# Patient Record
Sex: Female | Born: 1935
Health system: Southern US, Community
[De-identification: ages and names within clinical notes are randomized; demographics above are authoritative.]

## PROBLEM LIST (undated history)

## (undated) DIAGNOSIS — R110 Nausea: Secondary | ICD-10-CM

## (undated) DIAGNOSIS — E7800 Pure hypercholesterolemia, unspecified: Secondary | ICD-10-CM

## (undated) DIAGNOSIS — Z9289 Personal history of other medical treatment: Secondary | ICD-10-CM

## (undated) DIAGNOSIS — N1330 Unspecified hydronephrosis: Secondary | ICD-10-CM

## (undated) DIAGNOSIS — I10 Essential (primary) hypertension: Secondary | ICD-10-CM

## (undated) DIAGNOSIS — S37019A Minor contusion of unspecified kidney, initial encounter: Secondary | ICD-10-CM

## (undated) DIAGNOSIS — N200 Calculus of kidney: Secondary | ICD-10-CM

## (undated) DIAGNOSIS — K219 Gastro-esophageal reflux disease without esophagitis: Secondary | ICD-10-CM

## (undated) DIAGNOSIS — I5022 Chronic systolic (congestive) heart failure: Secondary | ICD-10-CM

## (undated) DIAGNOSIS — N201 Calculus of ureter: Secondary | ICD-10-CM

## (undated) DIAGNOSIS — M1990 Unspecified osteoarthritis, unspecified site: Secondary | ICD-10-CM

## (undated) DIAGNOSIS — Z87442 Personal history of urinary calculi: Secondary | ICD-10-CM

## (undated) DIAGNOSIS — M109 Gout, unspecified: Secondary | ICD-10-CM

## (undated) DIAGNOSIS — R310 Gross hematuria: Secondary | ICD-10-CM

## (undated) HISTORY — PX: COLONOSCOPY: SHX174

## (undated) HISTORY — DX: Calculus of kidney: N20.0

## (undated) HISTORY — DX: Minor contusion of unspecified kidney, initial encounter: S37.019A

## (undated) HISTORY — DX: Calculus of ureter: N20.1

## (undated) HISTORY — DX: Unspecified hydronephrosis: N13.30

## (undated) HISTORY — DX: Essential (primary) hypertension: I10

## (undated) HISTORY — DX: Nausea: R11.0

## (undated) HISTORY — PX: ABDOMINAL HYSTERECTOMY: SHX81

## (undated) HISTORY — DX: Gross hematuria: R31.0

## (undated) HISTORY — PX: KIDNEY STONE SURGERY: SHX686

---

## 2005-08-13 ENCOUNTER — Emergency Department: Payer: Self-pay | Admitting: Emergency Medicine

## 2005-10-01 ENCOUNTER — Other Ambulatory Visit: Payer: Self-pay

## 2005-10-01 ENCOUNTER — Ambulatory Visit: Payer: Self-pay | Admitting: Specialist

## 2005-10-08 ENCOUNTER — Ambulatory Visit: Payer: Self-pay | Admitting: Specialist

## 2006-04-14 ENCOUNTER — Ambulatory Visit: Payer: Self-pay | Admitting: Ophthalmology

## 2006-04-21 ENCOUNTER — Ambulatory Visit: Payer: Self-pay | Admitting: Ophthalmology

## 2006-04-25 ENCOUNTER — Ambulatory Visit: Payer: Self-pay | Admitting: Ophthalmology

## 2006-04-30 ENCOUNTER — Ambulatory Visit: Payer: Self-pay | Admitting: Ophthalmology

## 2006-08-29 ENCOUNTER — Other Ambulatory Visit: Payer: Self-pay

## 2006-08-29 ENCOUNTER — Emergency Department: Payer: Self-pay

## 2008-03-15 ENCOUNTER — Ambulatory Visit: Payer: Self-pay | Admitting: Internal Medicine

## 2008-06-29 ENCOUNTER — Ambulatory Visit: Payer: Self-pay | Admitting: Internal Medicine

## 2009-03-08 ENCOUNTER — Emergency Department: Payer: Self-pay | Admitting: Emergency Medicine

## 2009-03-20 ENCOUNTER — Emergency Department: Payer: Self-pay | Admitting: Unknown Physician Specialty

## 2009-12-26 ENCOUNTER — Ambulatory Visit: Payer: Self-pay | Admitting: Internal Medicine

## 2010-05-21 ENCOUNTER — Ambulatory Visit: Payer: Self-pay | Admitting: Internal Medicine

## 2010-05-22 ENCOUNTER — Ambulatory Visit: Payer: Self-pay | Admitting: Sports Medicine

## 2010-11-21 ENCOUNTER — Ambulatory Visit: Payer: Self-pay | Admitting: Internal Medicine

## 2010-12-12 ENCOUNTER — Ambulatory Visit: Payer: Self-pay | Admitting: Internal Medicine

## 2011-02-26 ENCOUNTER — Ambulatory Visit: Payer: Self-pay | Admitting: Internal Medicine

## 2012-03-13 ENCOUNTER — Ambulatory Visit: Payer: Self-pay | Admitting: Internal Medicine

## 2012-05-19 ENCOUNTER — Ambulatory Visit: Payer: Self-pay | Admitting: Podiatry

## 2012-08-25 ENCOUNTER — Emergency Department: Payer: Self-pay | Admitting: *Deleted

## 2013-03-16 ENCOUNTER — Ambulatory Visit: Payer: Self-pay

## 2013-10-20 ENCOUNTER — Emergency Department: Payer: Self-pay | Admitting: Emergency Medicine

## 2013-10-20 LAB — COMPREHENSIVE METABOLIC PANEL
Alkaline Phosphatase: 54 U/L (ref 50–136)
BUN: 20 mg/dL — ABNORMAL HIGH (ref 7–18)
Co2: 25 mmol/L (ref 21–32)
Creatinine: 1.32 mg/dL — ABNORMAL HIGH (ref 0.60–1.30)
EGFR (African American): 42 — ABNORMAL LOW
SGOT(AST): 29 U/L (ref 15–37)
Sodium: 139 mmol/L (ref 136–145)
Total Protein: 6.5 g/dL (ref 6.4–8.2)

## 2013-10-20 LAB — CBC
HCT: 36.5 % (ref 35.0–47.0)
MCH: 30 pg (ref 26.0–34.0)
MCV: 90 fL (ref 80–100)
Platelet: 206 10*3/uL (ref 150–440)
RBC: 4.05 10*6/uL (ref 3.80–5.20)

## 2013-10-20 LAB — LIPASE, BLOOD: Lipase: 228 U/L (ref 73–393)

## 2013-10-27 ENCOUNTER — Ambulatory Visit: Payer: Self-pay | Admitting: Urology

## 2013-10-27 DIAGNOSIS — I10 Essential (primary) hypertension: Secondary | ICD-10-CM

## 2013-11-01 ENCOUNTER — Ambulatory Visit: Payer: Self-pay | Admitting: Urology

## 2013-11-16 ENCOUNTER — Ambulatory Visit: Payer: Self-pay | Admitting: Urology

## 2013-11-18 ENCOUNTER — Ambulatory Visit: Payer: Self-pay | Admitting: Urology

## 2013-12-15 ENCOUNTER — Ambulatory Visit: Payer: Self-pay | Admitting: Urology

## 2014-04-06 ENCOUNTER — Ambulatory Visit: Payer: Self-pay | Admitting: Urology

## 2014-06-27 ENCOUNTER — Emergency Department: Payer: Self-pay | Admitting: Emergency Medicine

## 2014-06-27 LAB — TROPONIN I: Troponin-I: <0.02 ng/mL

## 2014-06-27 LAB — BASIC METABOLIC PANEL
Anion Gap: 10 (ref 7–16)
BUN: 26 mg/dL — ABNORMAL HIGH (ref 7–18)
CREATININE: 1.73 mg/dL — AB (ref 0.60–1.30)
Calcium, Total: 8.6 mg/dL (ref 8.5–10.1)
Chloride: 103 mmol/L (ref 98–107)
Co2: 23 mmol/L (ref 21–32)
EGFR (African American): 30 — ABNORMAL LOW
EGFR (Non-African Amer.): 26 — ABNORMAL LOW
Glucose: 126 mg/dL — ABNORMAL HIGH (ref 65–99)
Osmolality: 278 (ref 275–301)
Potassium: 3.7 mmol/L (ref 3.5–5.1)
SODIUM: 136 mmol/L (ref 136–145)

## 2014-06-27 LAB — CBC
HCT: 34.2 % — ABNORMAL LOW (ref 35.0–47.0)
HGB: 10.9 g/dL — ABNORMAL LOW (ref 12.0–16.0)
MCH: 29.4 pg (ref 26.0–34.0)
MCHC: 31.9 g/dL — ABNORMAL LOW (ref 32.0–36.0)
MCV: 92 fL (ref 80–100)
Platelet: 218 10*3/uL (ref 150–440)
RBC: 3.72 10*6/uL — ABNORMAL LOW (ref 3.80–5.20)
RDW: 14.3 % (ref 11.5–14.5)
WBC: 6.9 10*3/uL (ref 3.6–11.0)

## 2014-06-27 LAB — CK TOTAL AND CKMB (NOT AT ARMC)
CK, TOTAL: 96 U/L
CK-MB: 1.5 ng/mL (ref 0.5–3.6)

## 2014-10-20 ENCOUNTER — Ambulatory Visit: Payer: Self-pay | Admitting: Urology

## 2015-03-31 NOTE — Op Note (Signed)
PATIENT NAME:  Kathy Villanueva, Kathy Villanueva MR#:  [id number] DATE OF BIRTH:  1924/04/01  DATE OF PROCEDURE:  11/01/2013  PREOPERATIVE DIAGNOSIS: Right ureteropelvic junction calculus with obstruction.   POSTOPERATIVE DIAGNOSIS: Right ureteropelvic junction calculus with obstruction.   PROCEDURE: Right retrograde pyelogram and cystoscopy with placement of a right ureteral stent.   SURGEON: Emerson Schreifels D. Elnoria Howard, DO  ANESTHESIA: General.  DESCRIPTION OF PROCEDURE: The patient was sterilely prepped and draped in supine lithotomy position for ease of approach to the bladder. I used a 22-French cystoscope after appropriate timeout. The 22 cystoscope was well lubricated, wide mouth, to look in the bladder. The ureters seemed to be in B position with horseshoe-like orifices. The bladder itself does not show a great amount of trabeculation for an 81 year old patient. There was no bladder tumor, masses, or growths in the bladder. The right retrograde is done. The patient is obviously very constipated. So the right retrograde is done and stent is placed after seeing a UPJ filling defect. After the stent is placed, drainage is done. I checked the position of the stent. Stent is in good position in the bladder. The patient is sent to recovery in satisfactory condition. Bladder was then emptied. 30 mL of 0.5% Marcaine was placed in the bladder, a B and O suppository in the rectum. She tolerated the procedure well.  ____________________________ Janice Coffin. Elnoria Howard, DO rdh:sb D: 11/01/2013 13:35:52 ET T: 11/01/2013 14:28:33 ET JOB#: [id number]  cc: Janice Coffin. Elnoria Howard, DO, <Dictator> Aviela Blundell D Kenedee Molesky DO ELECTRONICALLY SIGNED 11/26/2013 15:39

## 2015-10-26 ENCOUNTER — Encounter: Payer: Self-pay | Admitting: *Deleted

## 2015-11-01 ENCOUNTER — Ambulatory Visit: Payer: Self-pay | Admitting: Urology

## 2015-11-10 ENCOUNTER — Encounter: Payer: Self-pay | Admitting: Urology

## 2015-11-10 ENCOUNTER — Ambulatory Visit (INDEPENDENT_AMBULATORY_CARE_PROVIDER_SITE_OTHER): Payer: Medicare Other | Admitting: Urology

## 2015-11-10 VITALS — BP 188/75 | HR 60 | Ht 69.0 in | Wt 106.0 lb

## 2015-11-10 DIAGNOSIS — Z87442 Personal history of urinary calculi: Secondary | ICD-10-CM | POA: Diagnosis not present

## 2015-11-10 DIAGNOSIS — S37019S Minor contusion of unspecified kidney, sequela: Secondary | ICD-10-CM | POA: Diagnosis not present

## 2015-11-10 LAB — URINALYSIS, COMPLETE
BILIRUBIN UA: NEGATIVE
Glucose, UA: NEGATIVE
KETONES UA: NEGATIVE
LEUKOCYTES UA: NEGATIVE
Nitrite, UA: NEGATIVE
PH UA: 5.5 (ref 5.0–7.5)
SPEC GRAV UA: 1.025 (ref 1.005–1.030)
Urobilinogen, Ur: 0.2 mg/dL (ref 0.2–1.0)

## 2015-11-10 LAB — MICROSCOPIC EXAMINATION
BACTERIA UA: NONE SEEN
RENAL EPITHEL UA: NONE SEEN /HPF

## 2015-11-10 NOTE — Progress Notes (Signed)
11/10/2015 8:43 PM   Kathy Villanueva [birth year] [id number]  Referring provider: No referring provider defined for this encounter.  Chief Complaint  Patient presents with  . Hematoma of kidney    1 year recheck    HPI: Patient is an 81 year old African-American female who suffered a renal hematoma after ESWL in 03/2014 who presents today for her yearly recheck.  Background history Patient underwent ESWL on 11/18/2013 for right UPJ stone ( 8 mm x 5 mm).  The postprocedural course was uneventful.  Follow-up renal ultrasound performed 1 month later did not note any hydronephrosis. Patient then underwent a 24-hour litho link report.  She was still experiencing intermittent right-sided flank pain at her return appointment to discuss results of the 24-hour litho-link report, so when an office renal ultrasound was performed. Hydronephrosis was noted in the right kidney and patient underwent CT scan for further evaluation.  CT scan noted a 4 cm x 3 cm x 1.9 cm subscapular hematoma.  A follow-up renal ultrasound noted a hematoma had reduced in size.  Today, the patient is not experiencing right-sided flank pain. Her urinalysis is negative.  Her most recent serum creatinine is 1.4 on 08/21/2015.   PMH: Past Medical History  Diagnosis Date  . Hematoma of kidney   . HTN (hypertension)   . Nausea   . Kidney stones   . Hydronephrosis   . Gross hematuria   . Ureteral stone     Surgical History: Past Surgical History  Procedure Laterality Date  . Abdominal hysterectomy      Home Medications:    Medication List       This list is accurate as of: 11/10/15 11:59 PM.  Always use your most recent med list.               amLODipine 5 MG tablet  Commonly known as:  NORVASC  TAKE 1 TABLET BY MOUTH EACH DAY     hydrochlorothiazide 12.5 MG tablet  Commonly known as:  HYDRODIURIL  Take by mouth.     RA VITAMIN B-12 TR 1000 MCG Tbcr  Generic drug:  Cyanocobalamin  Take by mouth.          Allergies: No Known Allergies  Family History: Family History  Problem Relation Age of Onset  . Kidney disease Neg Hx   . Bladder Cancer Neg Hx     Social History:  reports that she has never smoked. She does not have any smokeless tobacco history on file. She reports that she does not drink alcohol or use illicit drugs.  ROS: UROLOGY Frequent Urination?: No Hard to postpone urination?: No Burning/pain with urination?: No Get up at night to urinate?: No Leakage of urine?: No Urine stream starts and stops?: No Trouble starting stream?: No Do you have to strain to urinate?: No Blood in urine?: No Urinary tract infection?: No Sexually transmitted disease?: No Injury to kidneys or bladder?: No Painful intercourse?: No Weak stream?: No Currently pregnant?: No Vaginal bleeding?: No Last menstrual period?: n  Gastrointestinal Nausea?: No Vomiting?: No Indigestion/heartburn?: No Diarrhea?: No Constipation?: No  Constitutional Fever: No Night sweats?: No Weight loss?: No Fatigue?: No  Skin Skin rash/lesions?: No Itching?: No  Eyes Blurred vision?: No Double vision?: No  Ears/Nose/Throat Sore throat?: No Sinus problems?: No  Hematologic/Lymphatic Swollen glands?: No Easy bruising?: No  Cardiovascular Leg swelling?: No Chest pain?: No  Respiratory Cough?: No Shortness of breath?: No  Endocrine Excessive thirst?: No  Musculoskeletal Back pain?: No Joint  pain?: No  Neurological Headaches?: No Dizziness?: No  Psychologic Depression?: No Anxiety?: No  Physical Exam: BP 188/75 mmHg  Pulse 60  Ht 5\' 9"  (1.753 m)  Wt 106 lb (48.081 kg)  BMI 15.65 kg/m2  Constitutional: Well nourished. Alert and oriented, No acute distress. HEENT: [place] AT, moist mucus membranes. Trachea midline, no masses. Cardiovascular: No clubbing, cyanosis, or edema. Respiratory: Normal respiratory effort, no increased work of breathing. GI: Abdomen is soft, non  tender, non distended, no abdominal masses. Liver and spleen not palpable.  No hernias appreciated.  Stool sample for occult testing is not indicated.   GU: No CVA tenderness.  No bladder fullness or masses.   Skin: No rashes, bruises or suspicious lesions. Lymph: No cervical or inguinal adenopathy. Neurologic: Grossly intact, no focal deficits, moving all 4 extremities. Psychiatric: Normal mood and affect.  Laboratory Data: Lab Results  Component Value Date   WBC 6.9 06/27/2014   HGB 10.9* 06/27/2014   HCT 34.2* 06/27/2014   MCV 92 06/27/2014   PLT 218 06/27/2014    Lab Results  Component Value Date   CREATININE 1.73* 06/27/2014    Urinalysis Results for orders placed or performed in visit on 11/10/15  Microscopic Examination  Result Value Ref Range   WBC, UA 0-5 0 -  5 /hpf   RBC, UA 0-2 0 -  2 /hpf   Epithelial Cells (non renal) 0-10 0 - 10 /hpf   Renal Epithel, UA None seen None seen /hpf   Casts Present (A) None seen /lpf   Cast Type Hyaline casts N/A   Bacteria, UA None seen None seen/Few  Urinalysis, Complete  Result Value Ref Range   Specific Gravity, UA 1.025 1.005 - 1.030   pH, UA 5.5 5.0 - 7.5   Color, UA Yellow Yellow   Appearance Ur Clear Clear   Leukocytes, UA Negative Negative   Protein, UA 1+ (A) Negative/Trace   Glucose, UA Negative Negative   Ketones, UA Negative Negative   RBC, UA 1+ (A) Negative   Bilirubin, UA Negative Negative   Urobilinogen, Ur 0.2 0.2 - 1.0 mg/dL   Nitrite, UA Negative Negative   Microscopic Examination See below:     Assessment & Plan:    1. Hematoma of kidney, unspecified laterality, sequela:   Patient is not experiencing any further flank pain, gross hematuria and her serum creatinine is stable. She does not wish to undergo any further imaging studies at this time. She will follow-up in one year for UA and symptom check.  - Urinalysis, Complete  2. History of nephrolithiasis:   Patient has not had any further flank  pain or passage of stones. She does not desire any imaging study at this time. She will return in 1 year for UA and symptom recheck.   Return in about 1 year (around 11/09/2016) for UA  .  Zara Council, [facility] [address], [place],  [address] [phone]

## 2015-11-12 DIAGNOSIS — Z87442 Personal history of urinary calculi: Secondary | ICD-10-CM | POA: Insufficient documentation

## 2015-11-12 DIAGNOSIS — S37019A Minor contusion of unspecified kidney, initial encounter: Secondary | ICD-10-CM | POA: Insufficient documentation

## 2016-09-24 NOTE — Discharge Instructions (Signed)

## 2016-09-27 ENCOUNTER — Encounter: Payer: Self-pay | Admitting: Anesthesiology

## 2016-10-02 ENCOUNTER — Ambulatory Visit: Admission: RE | Admit: 2016-10-02 | Payer: Medicaid Other | Source: Ambulatory Visit | Admitting: Ophthalmology

## 2016-10-02 HISTORY — DX: Gastro-esophageal reflux disease without esophagitis: K21.9

## 2016-10-02 HISTORY — DX: Pure hypercholesterolemia, unspecified: E78.00

## 2016-10-02 HISTORY — DX: Unspecified osteoarthritis, unspecified site: M19.90

## 2016-10-02 HISTORY — DX: Personal history of other medical treatment: Z92.89

## 2016-10-02 HISTORY — DX: Gout, unspecified: M10.9

## 2016-10-02 HISTORY — DX: Personal history of urinary calculi: Z87.442

## 2016-10-02 SURGERY — PHACOEMULSIFICATION, CATARACT, WITH IOL INSERTION
Anesthesia: Topical | Laterality: Left

## 2016-11-13 ENCOUNTER — Encounter: Payer: Self-pay | Admitting: Urology

## 2016-11-13 ENCOUNTER — Ambulatory Visit: Payer: Medicare Other | Admitting: Urology

## 2016-11-13 NOTE — Progress Notes (Deleted)
11/13/2016 8:43 AM   Kathy Villanueva 1923/12/25 [id number]  Referring provider: Leonel Ramsay, MD [place], [place] [address]  No chief complaint on file.   HPI: Patient is an 82 year old African-American female who suffered a renal hematoma after ESWL in 03/2014 who presents today for her yearly recheck.  Background history Patient underwent ESWL on 11/18/2013 for right UPJ stone ( 8 mm x 5 mm).  The postprocedural course was uneventful.  Follow-up renal ultrasound performed 1 month later did not note any hydronephrosis. Patient then underwent a 24-hour litho link report.  She was still experiencing intermittent right-sided flank pain at her return appointment to discuss results of the 24-hour litho-link report, so when an office renal ultrasound was performed. Hydronephrosis was noted in the right kidney and patient underwent CT scan for further evaluation.  CT scan noted a 4 cm x 3 cm x 1.9 cm subscapular hematoma.  A follow-up renal ultrasound noted a hematoma had reduced in size.  Today, the patient is not experiencing right-sided flank pain. Her urinalysis is negative.  Her most recent serum creatinine is 1.3 on 09/17/2016, which is stable.   PMH: Past Medical History:  Diagnosis Date  . Arthritis    hand  . GERD (gastroesophageal reflux disease)   . Gout    hand  . Gross hematuria   . Hematoma of kidney   . History of kidney stones   . HTN (hypertension)    controlled on meds  . Hydronephrosis   . Hypercholesteremia   . Kidney stones   . Nausea   . Transfusion history   . Ureteral stone     Surgical History: Past Surgical History:  Procedure Laterality Date  . ABDOMINAL HYSTERECTOMY    . COLONOSCOPY    . KIDNEY STONE SURGERY      Home Medications:    Medication List       Accurate as of 11/13/16  8:43 AM. Always use your most recent med list.          amLODipine 5 MG tablet Commonly  known as:  NORVASC TAKE 1 TABLET BY MOUTH EACH DAY   hydrochlorothiazide 12.5 MG tablet Commonly known as:  HYDRODIURIL Take by mouth.   mirtazapine 7.5 MG tablet Commonly known as:  REMERON Take 7.5 mg by mouth at bedtime.   RA VITAMIN B-12 TR 1000 MCG Tbcr Generic drug:  Cyanocobalamin Take by mouth.       Allergies: No Known Allergies  Family History: Family History  Problem Relation Age of Onset  . Kidney disease Neg Hx   . Bladder Cancer Neg Hx     Social History:  reports that she has never smoked. She has never used smokeless tobacco. She reports that she does not drink alcohol or use drugs.  ROS:                                        Physical Exam: There were no vitals taken for this visit.  Constitutional: Well nourished. Alert and oriented, No acute distress. HEENT: [place] AT, moist mucus membranes. Trachea midline, no masses. Cardiovascular: No clubbing, cyanosis, or edema. Respiratory: Normal respiratory effort, no increased work of breathing. GI: Abdomen is soft, non tender, non distended, no abdominal masses. Liver and spleen not palpable.  No hernias appreciated.  Stool sample for occult testing is  not indicated.   GU: No CVA tenderness.  No bladder fullness or masses.   Skin: No rashes, bruises or suspicious lesions. Lymph: No cervical or inguinal adenopathy. Neurologic: Grossly intact, no focal deficits, moving all 4 extremities. Psychiatric: Normal mood and affect.  Laboratory Data: Lab Results  Component Value Date   WBC 6.9 06/27/2014   HGB 10.9 (L) 06/27/2014   HCT 34.2 (L) 06/27/2014   MCV 92 06/27/2014   PLT 218 06/27/2014    Lab Results  Component Value Date   CREATININE 1.73 (H) 06/27/2014    Urinalysis Results for orders placed or performed in visit on 11/10/15  Microscopic Examination  Result Value Ref Range   WBC, UA 0-5 0 - 5 /hpf   RBC, UA 0-2 0 - 2 /hpf   Epithelial Cells (non renal) 0-10 0 - 10  /hpf   Renal Epithel, UA None seen None seen /hpf   Casts Present (A) None seen /lpf   Cast Type Hyaline casts N/A   Bacteria, UA None seen None seen/Few  Urinalysis, Complete  Result Value Ref Range   Specific Gravity, UA 1.025 1.005 - 1.030   pH, UA 5.5 5.0 - 7.5   Color, UA Yellow Yellow   Appearance Ur Clear Clear   Leukocytes, UA Negative Negative   Protein, UA 1+ (A) Negative/Trace   Glucose, UA Negative Negative   Ketones, UA Negative Negative   RBC, UA 1+ (A) Negative   Bilirubin, UA Negative Negative   Urobilinogen, Ur 0.2 0.2 - 1.0 mg/dL   Nitrite, UA Negative Negative   Microscopic Examination See below:     Assessment & Plan:    1. Hematoma of kidney, unspecified laterality, sequela:   Patient is not experiencing any further flank pain, gross hematuria and her serum creatinine is stable. She does not wish to undergo any further imaging studies at this time. She will follow-up in one year for UA and symptom check.  - Urinalysis, Complete  2. History of nephrolithiasis:   Patient has not had any further flank pain or passage of stones. She does not desire any imaging study at this time. She will return in 1 year for UA and symptom recheck.   No Follow-up on file.  Zara Council, [facility] [address], [place], [place] [address] [phone]

## 2018-05-26 ENCOUNTER — Other Ambulatory Visit: Payer: Self-pay | Admitting: Physician Assistant

## 2018-05-26 ENCOUNTER — Ambulatory Visit
Admission: RE | Admit: 2018-05-26 | Discharge: 2018-05-26 | Disposition: A | Discharge status: Home / Self Care | Payer: Medicare HMO | Source: Ambulatory Visit | Attending: Physician Assistant | Admitting: Physician Assistant

## 2018-05-26 DIAGNOSIS — H539 Unspecified visual disturbance: Secondary | ICD-10-CM | POA: Diagnosis present

## 2018-05-26 DIAGNOSIS — R42 Dizziness and giddiness: Secondary | ICD-10-CM

## 2018-05-26 DIAGNOSIS — R51 Headache: Secondary | ICD-10-CM | POA: Diagnosis present

## 2018-05-26 DIAGNOSIS — R634 Abnormal weight loss: Secondary | ICD-10-CM

## 2018-05-26 DIAGNOSIS — R519 Headache, unspecified: Secondary | ICD-10-CM

## 2018-05-26 DIAGNOSIS — G3189 Other specified degenerative diseases of nervous system: Secondary | ICD-10-CM | POA: Diagnosis not present

## 2018-05-26 DIAGNOSIS — G960 Cerebrospinal fluid leak: Secondary | ICD-10-CM | POA: Insufficient documentation

## 2019-10-14 ENCOUNTER — Encounter: Payer: Self-pay | Admitting: *Deleted

## 2019-10-14 ENCOUNTER — Other Ambulatory Visit: Payer: Self-pay

## 2019-10-15 ENCOUNTER — Other Ambulatory Visit
Admission: RE | Admit: 2019-10-15 | Discharge: 2019-10-15 | Disposition: A | Discharge status: Home / Self Care | Payer: Medicare HMO | Source: Ambulatory Visit | Attending: Ophthalmology | Admitting: Ophthalmology

## 2019-10-15 DIAGNOSIS — Z01812 Encounter for preprocedural laboratory examination: Secondary | ICD-10-CM | POA: Insufficient documentation

## 2019-10-15 DIAGNOSIS — Z20828 Contact with and (suspected) exposure to other viral communicable diseases: Secondary | ICD-10-CM | POA: Diagnosis not present

## 2019-10-15 LAB — SARS CORONAVIRUS 2 (TAT 6-24 HRS): SARS Coronavirus 2: NEGATIVE

## 2019-10-18 NOTE — Discharge Instructions (Signed)
General Anesthesia, Adult, Care After °This sheet gives you information about how to care for yourself after your procedure. Your health care provider may also give you more specific instructions. If you have problems or questions, contact your health care provider. °What can I expect after the procedure? °After the procedure, the following side effects are common: °· Pain or discomfort at the IV site. °· Nausea. °· Vomiting. °· Sore throat. °· Trouble concentrating. °· Feeling cold or chills. °· Weak or tired. °· Sleepiness and fatigue. °· Soreness and body aches. These side effects can affect parts of the body that were not involved in surgery. °Follow these instructions at home: ° °For at least 24 hours after the procedure: °· Have a responsible adult stay with you. It is important to have someone help care for you until you are awake and alert. °· Rest as needed. °· Do not: °? Participate in activities in which you could fall or become injured. °? Drive. °? Use heavy machinery. °? Drink alcohol. °? Take sleeping pills or medicines that cause drowsiness. °? Make important decisions or sign legal documents. °? Take care of children on your own. °Eating and drinking °· Follow any instructions from your health care provider about eating or drinking restrictions. °· When you feel hungry, start by eating small amounts of foods that are soft and easy to digest (bland), such as toast. Gradually return to your regular diet. °· Drink enough fluid to keep your urine pale yellow. °· If you vomit, rehydrate by drinking water, juice, or clear broth. °General instructions °· If you have sleep apnea, surgery and certain medicines can increase your risk for breathing problems. Follow instructions from your health care provider about wearing your sleep device: °? Anytime you are sleeping, including during daytime naps. °? While taking prescription pain medicines, sleeping medicines, or medicines that make you drowsy. °· Return to  your normal activities as told by your health care provider. Ask your health care provider what activities are safe for you. °· Take over-the-counter and prescription medicines only as told by your health care provider. °· If you smoke, do not smoke without supervision. °· Keep all follow-up visits as told by your health care provider. This is important. °Contact a health care provider if: °· You have nausea or vomiting that does not get better with medicine. °· You cannot eat or drink without vomiting. °· You have pain that does not get better with medicine. °· You are unable to pass urine. °· You develop a skin rash. °· You have a fever. °· You have redness around your IV site that gets worse. °Get help right away if: °· You have difficulty breathing. °· You have chest pain. °· You have blood in your urine or stool, or you vomit blood. °Summary °· After the procedure, it is common to have a sore throat or nausea. It is also common to feel tired. °· Have a responsible adult stay with you for the first 24 hours after general anesthesia. It is important to have someone help care for you until you are awake and alert. °· When you feel hungry, start by eating small amounts of foods that are soft and easy to digest (bland), such as toast. Gradually return to your regular diet. °· Drink enough fluid to keep your urine pale yellow. °· Return to your normal activities as told by your health care provider. Ask your health care provider what activities are safe for you. °This information is not   intended to replace advice given to you by your health care provider. Make sure you discuss any questions you have with your health care provider. °Document Released: 03/03/2001 Document Revised: 11/28/2017 Document Reviewed: 07/11/2017 °Elsevier Patient Education © 2020 [employer] °Cataract Surgery, Care After °This sheet gives you information about how to care for yourself after your procedure. Your health care provider may also  give you more specific instructions. If you have problems or questions, contact your health care provider. °What can I expect after the procedure? °After the procedure, it is common to have: °· Itching. °· Discomfort. °· Fluid discharge. °· Sensitivity to light and to touch. °· Bruising in or around the eye. °· Mild blurred vision. °Follow these instructions at home: °Eye care ° °· Do not touch or rub your eyes. °· Protect your eyes as told by your health care provider. You may be told to wear a protective eye shield or sunglasses. °· Do not put a contact lens into the affected eye or eyes until your health care provider approves. °· Keep the area around your eye clean and dry: °? Avoid swimming. °? Do not allow water to hit you directly in the face while showering. °? Keep soap and shampoo out of your eyes. °· Check your eye every day for signs of infection. Watch for: °? Redness, swelling, or pain. °? Fluid, blood, or pus. °? Warmth. °? A bad smell. °? Vision that is getting worse. °? Sensitivity that is getting worse. °Activity °· Do not drive for 24 hours if you were given a sedative during your procedure. °· Avoid strenuous activities, such as playing contact sports, for as long as told by your health care provider. °· Do not drive or use heavy machinery until your health care provider approves. °· Do not bend or lift heavy objects. Bending increases pressure in the eye. You can walk, climb stairs, and do light household chores. °· Ask your health care provider when you can return to work. If you work in a dusty environment, you may be advised to wear protective eyewear for a period of time. °General instructions °· Take or apply over-the-counter and prescription medicines only as told by your health care provider. This includes eye drops. °· Keep all follow-up visits as told by your health care provider. This is important. °Contact a health care provider if: °· You have increased bruising around your  eye. °· You have pain that is not helped with medicine. °· You have a fever. °· You have redness, swelling, or pain in your eye. °· You have fluid, blood, or pus coming from your incision. °· Your vision gets worse. °· Your sensitivity to light gets worse. °Get help right away if: °· You have sudden loss of vision. °· You see flashes of light or spots (floaters). °· You have severe eye pain. °· You develop nausea or vomiting. °Summary °· After your procedure, it is common to have itching, discomfort, bruising, fluid discharge, or sensitivity to light. °· Follow instructions from your health care provider about caring for your eye after the procedure. °· Do not rub your eye after the procedure. You may need to wear eye protection or sunglasses. Do not wear contact lenses. Keep the area around your eye clean and dry. °· Avoid activities that require a lot of effort. These include playing sports and lifting heavy objects. °· Contact a health care provider if you have increased bruising, pain that does not go away, or a fever. Get   help right away if you suddenly lose your vision, see flashes of light or spots, or have severe pain in the eye. °This information is not intended to replace advice given to you by your health care provider. Make sure you discuss any questions you have with your health care provider. °Document Released: 06/14/2005 Document Revised: 05/25/2018 Document Reviewed: 05/25/2018 °Elsevier Patient Education © 2020 Elsevier Inc. ° °

## 2019-10-20 ENCOUNTER — Ambulatory Visit
Admission: RE | Admit: 2019-10-20 | Discharge: 2019-10-20 | Disposition: A | Discharge status: Home / Self Care | Payer: Medicare HMO | Attending: Ophthalmology | Admitting: Ophthalmology

## 2019-10-20 ENCOUNTER — Encounter
Admission: RE | Disposition: A | Discharge status: Home / Self Care | Payer: Self-pay | Source: Home / Self Care | Attending: Ophthalmology

## 2019-10-20 ENCOUNTER — Other Ambulatory Visit: Payer: Self-pay

## 2019-10-20 ENCOUNTER — Ambulatory Visit: Payer: Medicare HMO | Admitting: Anesthesiology

## 2019-10-20 DIAGNOSIS — H2512 Age-related nuclear cataract, left eye: Secondary | ICD-10-CM | POA: Diagnosis not present

## 2019-10-20 DIAGNOSIS — I10 Essential (primary) hypertension: Secondary | ICD-10-CM | POA: Diagnosis not present

## 2019-10-20 DIAGNOSIS — H5703 Miosis: Secondary | ICD-10-CM | POA: Insufficient documentation

## 2019-10-20 DIAGNOSIS — Z79899 Other long term (current) drug therapy: Secondary | ICD-10-CM | POA: Insufficient documentation

## 2019-10-20 HISTORY — PX: CATARACT EXTRACTION W/PHACO: SHX586

## 2019-10-20 SURGERY — PHACOEMULSIFICATION, CATARACT, WITH IOL INSERTION
Anesthesia: Monitor Anesthesia Care | Site: Eye | Laterality: Left

## 2019-10-20 MED ORDER — BRIMONIDINE TARTRATE-TIMOLOL 0.2-0.5 % OP SOLN
OPHTHALMIC | Status: DC | PRN
  Administered 2019-10-20: 1 [drp] via OPHTHALMIC

## 2019-10-20 MED ORDER — NA HYALUR & NA CHOND-NA HYALUR 0.4-0.35 ML IO KIT
PACK | INTRAOCULAR | Status: DC | PRN
  Administered 2019-10-20: 1 mL via INTRAOCULAR

## 2019-10-20 MED ORDER — LIDOCAINE HCL (PF) 2 % IJ SOLN
INTRAOCULAR | Status: DC | PRN
  Administered 2019-10-20: 2 mL

## 2019-10-20 MED ORDER — CEFUROXIME OPHTHALMIC INJECTION 1 MG/0.1 ML
INJECTION | OPHTHALMIC | Status: DC | PRN
  Administered 2019-10-20: 0.1 mL via INTRACAMERAL

## 2019-10-20 MED ORDER — ACETAMINOPHEN 325 MG PO TABS
325.0000 mg | ORAL_TABLET | Freq: Once | ORAL | Status: DC
Start: 2019-10-20 — End: 2019-10-20

## 2019-10-20 MED ORDER — MOXIFLOXACIN HCL 0.5 % OP SOLN
1.0000 [drp] | OPHTHALMIC | Status: DC | PRN
  Administered 2019-10-20 (×3): 1 [drp] via OPHTHALMIC

## 2019-10-20 MED ORDER — ARMC OPHTHALMIC DILATING DROPS
1.0000 "application " | OPHTHALMIC | Status: DC | PRN
  Administered 2019-10-20 (×3): 1 via OPHTHALMIC

## 2019-10-20 MED ORDER — EPINEPHRINE PF 1 MG/ML IJ SOLN
INTRAOCULAR | Status: DC | PRN
  Administered 2019-10-20: 12:00:00 66 mL via OPHTHALMIC

## 2019-10-20 MED ORDER — TETRACAINE HCL 0.5 % OP SOLN
1.0000 [drp] | OPHTHALMIC | Status: DC | PRN
  Administered 2019-10-20 (×3): 1 [drp] via OPHTHALMIC

## 2019-10-20 MED ORDER — FENTANYL CITRATE (PF) 100 MCG/2ML IJ SOLN
INTRAMUSCULAR | Status: DC | PRN
  Administered 2019-10-20: 50 ug via INTRAVENOUS

## 2019-10-20 MED ORDER — ACETAMINOPHEN 160 MG/5ML PO SOLN
325.0000 mg | Freq: Once | ORAL | Status: DC
Start: 2019-10-20 — End: 2019-10-20

## 2019-10-20 SURGICAL SUPPLY — 20 items
CANNULA ANT/CHMB 27G (MISCELLANEOUS) ×1 IMPLANT
CANNULA ANT/CHMB 27GA (MISCELLANEOUS) ×2 IMPLANT
GLOVE SURG LX 7.5 STRW (GLOVE) ×1
GLOVE SURG LX STRL 7.5 STRW (GLOVE) ×1 IMPLANT
GLOVE SURG TRIUMPH 8.0 PF LTX (GLOVE) ×2 IMPLANT
GOWN STRL REUS W/ TWL LRG LVL3 (GOWN DISPOSABLE) ×2 IMPLANT
GOWN STRL REUS W/TWL LRG LVL3 (GOWN DISPOSABLE) ×2
LENS IOL TECNIS ITEC 20.5 (Intraocular Lens) ×1 IMPLANT
MARKER SKIN DUAL TIP RULER LAB (MISCELLANEOUS) ×2 IMPLANT
NDL FILTER BLUNT 18X1 1/2 (NEEDLE) IMPLANT
NEEDLE FILTER BLUNT 18X 1/2SAF (NEEDLE) ×2
NEEDLE FILTER BLUNT 18X1 1/2 (NEEDLE) ×2 IMPLANT
PACK CATARACT BRASINGTON (MISCELLANEOUS) ×2 IMPLANT
PACK EYE AFTER SURG (MISCELLANEOUS) ×2 IMPLANT
PACK OPTHALMIC (MISCELLANEOUS) ×2 IMPLANT
RING MALYGIN 7.0 (MISCELLANEOUS) ×1 IMPLANT
SYR 3ML LL SCALE MARK (SYRINGE) ×3 IMPLANT
SYR TB 1ML LUER SLIP (SYRINGE) ×2 IMPLANT
WATER STERILE IRR 250ML POUR (IV SOLUTION) ×1 IMPLANT
WIPE NON LINTING 3.25X3.25 (MISCELLANEOUS) ×2 IMPLANT

## 2019-10-20 NOTE — H&P (Signed)

## 2019-10-20 NOTE — Anesthesia Postprocedure Evaluation (Signed)
Anesthesia Post Note  Patient: Kathy Villanueva  Procedure(s) Performed: CATARACT EXTRACTION PHACO AND INTRAOCULAR LENS PLACEMENT ([place]) LEFT MALYUGIN 01:25.9,  13.6%, 11.71 (Left Eye)     Patient location during evaluation: PACU Anesthesia Type: MAC Level of consciousness: awake and alert and oriented Pain management: satisfactory to patient Vital Signs Assessment: post-procedure vital signs reviewed and stable Respiratory status: spontaneous breathing, nonlabored ventilation and respiratory function stable Cardiovascular status: blood pressure returned to baseline and stable Postop Assessment: Adequate PO intake and No signs of nausea or vomiting Anesthetic complications: no    Raliegh Ip

## 2019-10-20 NOTE — Transfer of Care (Signed)
Immediate Anesthesia Transfer of Care Note  Patient: Kathy Villanueva  Procedure(s) Performed: CATARACT EXTRACTION PHACO AND INTRAOCULAR LENS PLACEMENT ([place]) LEFT MALYUGIN 01:25.9,  13.6%, 11.71 (Left Eye)  Patient Location: PACU  Anesthesia Type: MAC  Level of Consciousness: awake, alert  and patient cooperative  Airway and Oxygen Therapy: Patient Spontanous Breathing and Patient connected to supplemental oxygen  Post-op Assessment: Post-op Vital signs reviewed, Patient's Cardiovascular Status Stable, Respiratory Function Stable, Patent Airway and No signs of Nausea or vomiting  Post-op Vital Signs: Reviewed and stable  Complications: No apparent anesthesia complications

## 2019-10-20 NOTE — Anesthesia Procedure Notes (Signed)
Procedure Name: MAC Date/Time: 10/20/2019 11:59 AM Performed by: Cameron Ali, CRNA Pre-anesthesia Checklist: Patient identified, Emergency Drugs available, Suction available, Timeout performed and Patient being monitored Patient Re-evaluated:Patient Re-evaluated prior to induction Oxygen Delivery Method: Nasal cannula Placement Confirmation: positive ETCO2

## 2019-10-20 NOTE — Anesthesia Preprocedure Evaluation (Signed)
Anesthesia Evaluation  Patient identified by MRN, date of birth, ID band Patient awake    Reviewed: Allergy & Precautions, H&P , NPO status , Patient's Chart, lab work & pertinent test results  Airway Mallampati: II  TM Distance: >3 FB Neck ROM: full    Dental  (+) Missing, Edentulous Upper   Pulmonary    Pulmonary exam normal breath sounds clear to auscultation       Cardiovascular hypertension, Normal cardiovascular exam Rhythm:regular Rate:Normal     Neuro/Psych    GI/Hepatic GERD  ,  Endo/Other    Renal/GU Renal disease     Musculoskeletal   Abdominal   Peds  Hematology   Anesthesia Other Findings   Reproductive/Obstetrics                             Anesthesia Physical Anesthesia Plan  ASA: II  Anesthesia Plan: MAC   Post-op Pain Management:    Induction:   PONV Risk Score and Plan: 2 and Midazolam, TIVA and Treatment may vary due to age or medical condition  Airway Management Planned:   Additional Equipment:   Intra-op Plan:   Post-operative Plan:   Informed Consent: I have reviewed the patients History and Physical, chart, labs and discussed the procedure including the risks, benefits and alternatives for the proposed anesthesia with the patient or authorized representative who has indicated his/her understanding and acceptance.       Plan Discussed with: CRNA  Anesthesia Plan Comments:         Anesthesia Quick Evaluation

## 2019-10-20 NOTE — Op Note (Signed)
OPERATIVE NOTE  Journi Canipe [id number] 10/20/2019  PREOPERATIVE DIAGNOSIS:   Nuclear sclerotic cataract left eye with miotic pupil      H25.12   POSTOPERATIVE DIAGNOSIS:   Nuclear sclerotic cataract left eye with miotic pupil.     PROCEDURE:  Phacoemulsification with posterior chamber intraocular lens implantation of the left eye which required pupil stretching with the Malyugin pupil expansion device  Ultrasound time: Procedure(s): CATARACT EXTRACTION PHACO AND INTRAOCULAR LENS PLACEMENT (IOC) LEFT MALYUGIN 01:25.9,  13.6%, 11.71 (Left)  LENS:   Implant Name Type Inv. Item Serial No. Manufacturer Lot No. LRB No. Used Action  LENS IOL DIOP 20.5 - [id number] Intraocular Lens LENS IOL DIOP 20.5 [id number]  Left 1 Implanted      SURGEON:  Wyonia Hough, MD   ANESTHESIA: Topical with tetracaine drops and 2% Xylocaine jelly, augmented with 1% preservative-free intracameral lidocaine.   COMPLICATIONS:  None.   DESCRIPTION OF PROCEDURE:  The patient was identified in the holding room and transported to the operating room and placed in the supine position under the operating microscope.  The left eye was identified as the operative eye and it was prepped and draped in the usual sterile ophthalmic fashion.   A 1 millimeter clear-corneal paracentesis was made at the 1:30 position.  The anterior chamber was filled with Viscoat viscoelastic.  0.5 ml of preservative-free 1% lidocaine was injected into the anterior chamber.  A 2.4 millimeter keratome was used to make a near-clear corneal incision at the 10:30 position.  A Malyugin pupil expander was then placed through the main incision and into the anterior chamber of the eye.  The edge of the iris was secured on the lip of the pupil expander and it was released, thereby expanding the pupil to approximately 7 millimeters for completion of the cataract surgery.  Additional Viscoat was placed in the anterior chamber.  A cystotome and  capsulorrhexis forceps were used to make a curvilinear capsulorrhexis.   Balanced salt solution was used to hydrodissect and hydrodelineate the lens nucleus.   Phacoemulsification was used in stop and chop fashion to remove the lens, nucleus and epinucleus.  The remaining cortex was aspirated using the irrigation aspiration handpiece.  Additional Provisc was placed into the eye to distend the capsular bag for lens placement.  A lens was then injected into the capsular bag.  The pupil expanding ring was removed using a Kuglen hook and insertion device. The remaining viscoelastic was aspirated from the capsular bag and the anterior chamber.  The anterior chamber was filled with balanced salt solution to inflate to a physiologic pressure.   Wounds were hydrated with balanced salt solution.  The anterior chamber was inflated to a physiologic pressure with balanced salt solution.  No wound leaks were noted. Cefuroxime 0.1 ml of a 10mg /ml solution was injected into the anterior chamber for a dose of 1 mg of intracameral antibiotic at the completion of the case.   Timolol and Brimonidine drops were applied to the eye.  The patient was taken to the recovery room in stable condition without complications of anesthesia or surgery.  Chene Kasinger 10/20/2019, 12:22 PM

## 2019-10-21 ENCOUNTER — Encounter: Payer: Self-pay | Admitting: Ophthalmology

## 2021-02-05 DIAGNOSIS — R1031 Right lower quadrant pain: Secondary | ICD-10-CM | POA: Diagnosis not present

## 2021-02-05 DIAGNOSIS — G8929 Other chronic pain: Secondary | ICD-10-CM | POA: Diagnosis not present

## 2021-02-05 DIAGNOSIS — R300 Dysuria: Secondary | ICD-10-CM | POA: Diagnosis not present

## 2021-02-05 DIAGNOSIS — G629 Polyneuropathy, unspecified: Secondary | ICD-10-CM | POA: Diagnosis not present

## 2021-04-19 DIAGNOSIS — R059 Cough, unspecified: Secondary | ICD-10-CM | POA: Diagnosis not present

## 2021-04-19 DIAGNOSIS — U071 COVID-19: Secondary | ICD-10-CM | POA: Diagnosis not present

## 2021-04-19 DIAGNOSIS — R058 Other specified cough: Secondary | ICD-10-CM | POA: Diagnosis not present

## 2021-04-19 DIAGNOSIS — Z03818 Encounter for observation for suspected exposure to other biological agents ruled out: Secondary | ICD-10-CM | POA: Diagnosis not present

## 2021-04-19 DIAGNOSIS — R197 Diarrhea, unspecified: Secondary | ICD-10-CM | POA: Diagnosis not present

## 2021-04-20 ENCOUNTER — Other Ambulatory Visit
Admission: RE | Admit: 2021-04-20 | Discharge: 2021-04-20 | Disposition: A | Discharge status: Home / Self Care | Payer: Medicare HMO | Source: Ambulatory Visit | Attending: Internal Medicine | Admitting: Internal Medicine

## 2021-04-20 DIAGNOSIS — J40 Bronchitis, not specified as acute or chronic: Secondary | ICD-10-CM | POA: Diagnosis not present

## 2021-04-20 DIAGNOSIS — R197 Diarrhea, unspecified: Secondary | ICD-10-CM | POA: Insufficient documentation

## 2021-04-20 LAB — C DIFFICILE QUICK SCREEN W PCR REFLEX
C Diff antigen: NEGATIVE
C Diff interpretation: NOT DETECTED
C Diff toxin: NEGATIVE

## 2021-04-25 DIAGNOSIS — U071 COVID-19: Secondary | ICD-10-CM | POA: Diagnosis not present

## 2021-05-14 DIAGNOSIS — Z79899 Other long term (current) drug therapy: Secondary | ICD-10-CM | POA: Diagnosis not present

## 2021-05-14 DIAGNOSIS — Z0000 Encounter for general adult medical examination without abnormal findings: Secondary | ICD-10-CM | POA: Diagnosis not present

## 2021-05-14 DIAGNOSIS — I129 Hypertensive chronic kidney disease with stage 1 through stage 4 chronic kidney disease, or unspecified chronic kidney disease: Secondary | ICD-10-CM | POA: Diagnosis not present

## 2021-05-14 DIAGNOSIS — Z0001 Encounter for general adult medical examination with abnormal findings: Secondary | ICD-10-CM | POA: Diagnosis not present

## 2021-05-14 DIAGNOSIS — N1832 Chronic kidney disease, stage 3b: Secondary | ICD-10-CM | POA: Diagnosis not present

## 2021-10-01 ENCOUNTER — Emergency Department: Payer: Medicare HMO

## 2021-10-01 ENCOUNTER — Other Ambulatory Visit: Payer: Self-pay

## 2021-10-01 ENCOUNTER — Inpatient Hospital Stay: Payer: Medicare HMO

## 2021-10-01 ENCOUNTER — Inpatient Hospital Stay
Admission: EM | Admit: 2021-10-01 | Discharge: 2021-10-14 | DRG: 335 | Disposition: A | Discharge status: Home Health | Payer: Medicare HMO | Attending: Internal Medicine | Admitting: Internal Medicine

## 2021-10-01 DIAGNOSIS — Z681 Body mass index (BMI) 19 or less, adult: Secondary | ICD-10-CM | POA: Diagnosis not present

## 2021-10-01 DIAGNOSIS — N179 Acute kidney failure, unspecified: Secondary | ICD-10-CM | POA: Diagnosis not present

## 2021-10-01 DIAGNOSIS — Z90710 Acquired absence of both cervix and uterus: Secondary | ICD-10-CM

## 2021-10-01 DIAGNOSIS — I129 Hypertensive chronic kidney disease with stage 1 through stage 4 chronic kidney disease, or unspecified chronic kidney disease: Secondary | ICD-10-CM | POA: Diagnosis present

## 2021-10-01 DIAGNOSIS — R6339 Other feeding difficulties: Secondary | ICD-10-CM | POA: Diagnosis present

## 2021-10-01 DIAGNOSIS — E538 Deficiency of other specified B group vitamins: Secondary | ICD-10-CM | POA: Diagnosis present

## 2021-10-01 DIAGNOSIS — Z4682 Encounter for fitting and adjustment of non-vascular catheter: Secondary | ICD-10-CM | POA: Diagnosis not present

## 2021-10-01 DIAGNOSIS — K56600 Partial intestinal obstruction, unspecified as to cause: Secondary | ICD-10-CM | POA: Diagnosis not present

## 2021-10-01 DIAGNOSIS — K219 Gastro-esophageal reflux disease without esophagitis: Secondary | ICD-10-CM | POA: Diagnosis present

## 2021-10-01 DIAGNOSIS — Z87442 Personal history of urinary calculi: Secondary | ICD-10-CM

## 2021-10-01 DIAGNOSIS — M1990 Unspecified osteoarthritis, unspecified site: Secondary | ICD-10-CM | POA: Diagnosis present

## 2021-10-01 DIAGNOSIS — K56609 Unspecified intestinal obstruction, unspecified as to partial versus complete obstruction: Secondary | ICD-10-CM | POA: Diagnosis not present

## 2021-10-01 DIAGNOSIS — R64 Cachexia: Secondary | ICD-10-CM | POA: Diagnosis not present

## 2021-10-01 DIAGNOSIS — J9811 Atelectasis: Secondary | ICD-10-CM | POA: Diagnosis not present

## 2021-10-01 DIAGNOSIS — R1111 Vomiting without nausea: Secondary | ICD-10-CM | POA: Diagnosis not present

## 2021-10-01 DIAGNOSIS — K567 Ileus, unspecified: Secondary | ICD-10-CM | POA: Diagnosis not present

## 2021-10-01 DIAGNOSIS — R1110 Vomiting, unspecified: Secondary | ICD-10-CM | POA: Diagnosis not present

## 2021-10-01 DIAGNOSIS — R5381 Other malaise: Secondary | ICD-10-CM | POA: Diagnosis not present

## 2021-10-01 DIAGNOSIS — I77810 Thoracic aortic ectasia: Secondary | ICD-10-CM | POA: Diagnosis not present

## 2021-10-01 DIAGNOSIS — M109 Gout, unspecified: Secondary | ICD-10-CM | POA: Diagnosis present

## 2021-10-01 DIAGNOSIS — E7800 Pure hypercholesterolemia, unspecified: Secondary | ICD-10-CM | POA: Diagnosis present

## 2021-10-01 DIAGNOSIS — K5939 Other megacolon: Secondary | ICD-10-CM | POA: Diagnosis not present

## 2021-10-01 DIAGNOSIS — Z9842 Cataract extraction status, left eye: Secondary | ICD-10-CM

## 2021-10-01 DIAGNOSIS — R1084 Generalized abdominal pain: Secondary | ICD-10-CM | POA: Diagnosis not present

## 2021-10-01 DIAGNOSIS — Z20822 Contact with and (suspected) exposure to covid-19: Secondary | ICD-10-CM | POA: Diagnosis not present

## 2021-10-01 DIAGNOSIS — Z961 Presence of intraocular lens: Secondary | ICD-10-CM | POA: Diagnosis present

## 2021-10-01 DIAGNOSIS — Z95828 Presence of other vascular implants and grafts: Secondary | ICD-10-CM

## 2021-10-01 DIAGNOSIS — E43 Unspecified severe protein-calorie malnutrition: Secondary | ICD-10-CM | POA: Diagnosis not present

## 2021-10-01 DIAGNOSIS — R52 Pain, unspecified: Secondary | ICD-10-CM | POA: Diagnosis not present

## 2021-10-01 DIAGNOSIS — I10 Essential (primary) hypertension: Secondary | ICD-10-CM

## 2021-10-01 DIAGNOSIS — K5669 Other partial intestinal obstruction: Secondary | ICD-10-CM | POA: Diagnosis not present

## 2021-10-01 DIAGNOSIS — Z79899 Other long term (current) drug therapy: Secondary | ICD-10-CM | POA: Diagnosis not present

## 2021-10-01 DIAGNOSIS — Z9049 Acquired absence of other specified parts of digestive tract: Secondary | ICD-10-CM

## 2021-10-01 DIAGNOSIS — N1832 Chronic kidney disease, stage 3b: Secondary | ICD-10-CM | POA: Diagnosis not present

## 2021-10-01 DIAGNOSIS — K5651 Intestinal adhesions [bands], with partial obstruction: Secondary | ICD-10-CM | POA: Diagnosis not present

## 2021-10-01 DIAGNOSIS — K660 Peritoneal adhesions (postprocedural) (postinfection): Secondary | ICD-10-CM | POA: Diagnosis not present

## 2021-10-01 DIAGNOSIS — N184 Chronic kidney disease, stage 4 (severe): Secondary | ICD-10-CM

## 2021-10-01 DIAGNOSIS — K6389 Other specified diseases of intestine: Secondary | ICD-10-CM | POA: Diagnosis not present

## 2021-10-01 LAB — RESP PANEL BY RT-PCR (FLU A&B, COVID) ARPGX2
Influenza A by PCR: NEGATIVE
Influenza B by PCR: NEGATIVE
SARS Coronavirus 2 by RT PCR: NEGATIVE

## 2021-10-01 LAB — CBC
HCT: 38.1 % (ref 36.0–46.0)
Hemoglobin: 12.3 g/dL (ref 12.0–15.0)
MCH: 29.6 pg (ref 26.0–34.0)
MCHC: 32.3 g/dL (ref 30.0–36.0)
MCV: 91.8 fL (ref 80.0–100.0)
Platelets: 229 10*3/uL (ref 150–400)
RBC: 4.15 MIL/uL (ref 3.87–5.11)
RDW: 14.3 % (ref 11.5–15.5)
WBC: 9 10*3/uL (ref 4.0–10.5)
nRBC: 0 % (ref 0.0–0.2)

## 2021-10-01 LAB — COMPREHENSIVE METABOLIC PANEL
ALT: 14 U/L (ref 0–44)
AST: 25 U/L (ref 15–41)
Albumin: 3.9 g/dL (ref 3.5–5.0)
Alkaline Phosphatase: 30 U/L — ABNORMAL LOW (ref 38–126)
Anion gap: 11 (ref 5–15)
BUN: 29 mg/dL — ABNORMAL HIGH (ref 8–23)
CO2: 25 mmol/L (ref 22–32)
Calcium: 9.2 mg/dL (ref 8.9–10.3)
Chloride: 99 mmol/L (ref 98–111)
Creatinine, Ser: 1.32 mg/dL — ABNORMAL HIGH (ref 0.44–1.00)
GFR, Estimated: 37 mL/min — ABNORMAL LOW (ref >60)
Glucose, Bld: 99 mg/dL (ref 70–99)
Potassium: 4.4 mmol/L (ref 3.5–5.1)
Sodium: 135 mmol/L (ref 135–145)
Total Bilirubin: 1 mg/dL (ref 0.3–1.2)
Total Protein: 7 g/dL (ref 6.5–8.1)

## 2021-10-01 LAB — LIPASE, BLOOD: Lipase: 34 U/L (ref 11–51)

## 2021-10-01 MED ORDER — ACETAMINOPHEN 650 MG RE SUPP
650.0000 mg | Freq: Four times a day (QID) | RECTAL | Status: DC | PRN
  Filled 2021-10-01 (×2): qty 1

## 2021-10-01 MED ORDER — LACTATED RINGERS IV BOLUS
1000.0000 mL | Freq: Once | INTRAVENOUS | Status: AC
  Administered 2021-10-01: 1000 mL via INTRAVENOUS

## 2021-10-01 MED ORDER — ONDANSETRON HCL 4 MG/2ML IJ SOLN
4.0000 mg | Freq: Four times a day (QID) | INTRAMUSCULAR | Status: DC | PRN
  Administered 2021-10-02 – 2021-10-04 (×4): 4 mg via INTRAVENOUS
  Filled 2021-10-01 (×4): qty 2

## 2021-10-01 MED ORDER — IOHEXOL 350 MG/ML SOLN
50.0000 mL | Freq: Once | INTRAVENOUS | Status: AC | PRN
  Administered 2021-10-01: 50 mL via INTRAVENOUS

## 2021-10-01 MED ORDER — TRAZODONE HCL 50 MG PO TABS: 25.0000 mg | ORAL_TABLET | Freq: Every evening | ORAL | Status: DC | PRN

## 2021-10-01 MED ORDER — ONDANSETRON HCL 4 MG PO TABS: 4.0000 mg | ORAL_TABLET | Freq: Four times a day (QID) | ORAL | Status: DC | PRN

## 2021-10-01 MED ORDER — IOHEXOL 300 MG/ML  SOLN: 60.0000 mL | Freq: Once | INTRAMUSCULAR | Status: DC | PRN

## 2021-10-01 MED ORDER — ACETAMINOPHEN 325 MG PO TABS
650.0000 mg | ORAL_TABLET | Freq: Four times a day (QID) | ORAL | Status: DC | PRN
  Administered 2021-10-06: 650 mg via ORAL
  Filled 2021-10-01: qty 2

## 2021-10-01 MED ORDER — SODIUM CHLORIDE 0.9 % IV SOLN: INTRAVENOUS | Status: AC

## 2021-10-01 MED ORDER — ENOXAPARIN SODIUM 40 MG/0.4ML IJ SOSY: 40.0000 mg | PREFILLED_SYRINGE | INTRAMUSCULAR | Status: DC

## 2021-10-01 MED ORDER — AMLODIPINE BESYLATE 5 MG PO TABS: 5.0000 mg | ORAL_TABLET | Freq: Every day | ORAL | Status: DC

## 2021-10-01 MED ORDER — ONDANSETRON HCL 4 MG/2ML IJ SOLN
4.0000 mg | Freq: Once | INTRAMUSCULAR | Status: AC
  Administered 2021-10-01: 4 mg via INTRAVENOUS
  Filled 2021-10-01: qty 2

## 2021-10-01 MED ORDER — MAGNESIUM HYDROXIDE 400 MG/5ML PO SUSP
30.0000 mL | Freq: Every day | ORAL | Status: DC | PRN
Start: 2021-10-01 — End: 2021-10-14

## 2021-10-01 MED ORDER — VITAMIN B-12 1000 MCG PO TABS
1000.0000 ug | ORAL_TABLET | Freq: Every day | ORAL | Status: DC
  Filled 2021-10-01: qty 1

## 2021-10-01 NOTE — H&P (Addendum)
[place]   PATIENT NAME: Kathy Villanueva    MR#:  [id number]  DATE OF BIRTH:  [birth year]  DATE OF ADMISSION:  10/01/2021  PRIMARY CARE PHYSICIAN: Baxter Hire, MD   Patient is coming from: Home  REQUESTING/REFERRING PHYSICIAN: Brenton Grills, MD  CHIEF COMPLAINT:   Chief Complaint  Patient presents with  . Abdominal Pain    HISTORY OF PRESENT ILLNESS:  Kathy Villanueva is a 87 y.o. female adult with medical history significant for hypertension, dyslipidemia, gout and GERD as well as osteoarthritis, who presented to the emergency room with acute onset of generalized abdominal pain that is worsening over the last 4 to 5 days which has been intermittent with diminished bowel movements.  Yesterday she started vomiting as well and has not been able to tolerate anything by mouth today.  No fever or chills.  She denied any chest pain or palpitations.  She denied any diarrhea.  Her last bowel movement was yesterday.  No dyspnea or cough or wheezing.  No dysuria, oliguria or hematuria or flank pain.  ED Course: Upon presentation to the emergency room blood pressure was 145/69 with otherwise normal vital signs.  Labs revealed a BUN of 29 with a creatinine of 1.32.  CBC was within normal.  Influenza antigens and COVID-19 PCR came back negative.  Imaging: Abdominal pelvic CT scan revealed:  1. Multiple dilated fluid-filled loops of small bowel with relatively decompressed distal small bowel and colon, findings are felt consistent with bowel obstruction though discrete transition point difficult to visualize. 2. Small amount of abdominopelvic ascites 3. Atrophic kidneys with probable scarring on the right.  Abdominal x-ray revealed NG tube positioning in the body of the stomach and findings consistent with small bowel obstruction.  The patient was given 1 L bolus of IV lactated ringer and 4 mg of IV Zofran.  She will be admitted to a medical bed for further evaluation and  management. PAST MEDICAL HISTORY:   Past Medical History:  Diagnosis Date  . Arthritis    hand  . GERD (gastroesophageal reflux disease)   . Gout    hand  . Gross hematuria   . Hematoma of kidney   . History of kidney stones   . HTN (hypertension)    controlled on meds  . Hydronephrosis   . Hypercholesteremia   . Kidney stones   . Nausea   . Transfusion history   . Ureteral stone     PAST SURGICAL HISTORY:   Past Surgical History:  Procedure Laterality Date  . ABDOMINAL HYSTERECTOMY    . CATARACT EXTRACTION W/PHACO Left 10/20/2019   Procedure: CATARACT EXTRACTION PHACO AND INTRAOCULAR LENS PLACEMENT ([place]) LEFT MALYUGIN 01:25.9,  13.6%, 11.71;  Surgeon: Leandrew Koyanagi, MD;  Location: [place];  Service: Ophthalmology;  Laterality: Left;  . COLONOSCOPY    . KIDNEY STONE SURGERY      SOCIAL HISTORY:   Social History   Tobacco Use  . Smoking status: Never  . Smokeless tobacco: Never  Substance Use Topics  . Alcohol use: No    Alcohol/week: 0.0 standard drinks    FAMILY HISTORY:   Family History  Problem Relation Age of Onset  . Kidney disease Neg Hx   . Bladder Cancer Neg Hx     DRUG ALLERGIES:  No Known Allergies  REVIEW OF SYSTEMS:   ROS As per history of present illness. All pertinent systems were reviewed above. Constitutional, HEENT, cardiovascular, respiratory, GI, GU, musculoskeletal, neuro,  psychiatric, endocrine, integumentary and hematologic systems were reviewed and are otherwise negative/unremarkable except for positive findings mentioned above in the HPI.   MEDICATIONS AT HOME:   Prior to Admission medications   Medication Sig Start Date End Date Taking? Authorizing Provider  amLODipine (NORVASC) 5 MG tablet TAKE 1 TABLET BY MOUTH EACH DAY 10/09/15   [provider]  Cyanocobalamin (RA VITAMIN B-12 TR) 1000 MCG TBCR Take by mouth.    [provider]  hydrochlorothiazide (HYDRODIURIL) 12.5 MG tablet Take  by mouth. 07/07/15 10/14/19  [provider]      VITAL SIGNS:  Blood pressure (!) 170/95, pulse 77, temperature 97.8 F (36.6 C), temperature source Oral, resp. rate 16, SpO2 97 %.  PHYSICAL EXAMINATION:  Physical Exam  GENERAL:  87 y.o.-year-old African-American patient lying in the bed with no acute distress.  EYES: Pupils equal, round, reactive to light and accommodation. No scleral icterus. Extraocular muscles intact.  HEENT: Head atraumatic, normocephalic. Oropharynx and nasopharynx clear.  NECK:  Supple, no jugular venous distention. No thyroid enlargement, no tenderness.  LUNGS: Normal breath sounds bilaterally, no wheezing, rales,rhonchi or crepitation. No use of accessory muscles of respiration.  CARDIOVASCULAR: Regular rate and rhythm, S1, S2 normal. No murmurs, rubs, or gallops.  ABDOMEN: Soft, nondistended, with mild diffuse generalized tenderness and significant diminished bowel sounds. No organomegaly or mass.  EXTREMITIES: No pedal edema, cyanosis, or clubbing.  NEUROLOGIC: Cranial nerves II through XII are intact. Muscle strength 5/5 in all extremities. Sensation intact. Gait not checked.  PSYCHIATRIC: The patient is alert and oriented x 3.  Normal affect and good eye contact. SKIN: No obvious rash, lesion, or ulcer.   LABORATORY PANEL:   CBC Recent Labs  Lab 10/01/21 1434  WBC 9.0  HGB 12.3  HCT 38.1  PLT 229   ------------------------------------------------------------------------------------------------------------------  Chemistries  Recent Labs  Lab 10/01/21 1434  NA 135  K 4.4  CL 99  CO2 25  GLUCOSE 99  BUN 29*  CREATININE 1.32*  CALCIUM 9.2  AST 25  ALT 14  ALKPHOS 30*  BILITOT 1.0   ------------------------------------------------------------------------------------------------------------------  Cardiac Enzymes No results for input(s): TROPONINI in the last 168  hours. ------------------------------------------------------------------------------------------------------------------  RADIOLOGY:  CT ABDOMEN PELVIS W CONTRAST  Result Date: 10/01/2021 CLINICAL DATA:  Abdomen pain and vomiting EXAM: CT ABDOMEN AND PELVIS WITH CONTRAST TECHNIQUE: Multidetector CT imaging of the abdomen and pelvis was performed using the standard protocol following bolus administration of intravenous contrast. CONTRAST:  64mL OMNIPAQUE IOHEXOL 350 MG/ML SOLN COMPARISON:  CT 04/06/2014 FINDINGS: Lower chest: Lung bases demonstrate no acute consolidation or effusion. Borderline cardiac size. Hepatobiliary: No focal liver abnormality is seen. Status post cholecystectomy. No biliary dilatation. Pancreas: Unremarkable. No pancreatic ductal dilatation or surrounding inflammatory changes. Spleen: Normal in size without focal abnormality. Adrenals/Urinary Tract: Adrenal glands are within normal limits. Cortical atrophy within the kidneys. Coarse calcification at the upper pole right kidney. Dilated left greater than right renal pelvises without hydroureter or obstructing stone. The bladder is unremarkable. 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 Stomach/Bowel: The stomach is nonenlarged. Fluid-filled dilated mid small bowel measuring up to 3.3 cm. Relatively decompressed distal small bowel and colon, transition point not well-defined. No acute bowel wall thickening. Negative appendix. Vascular/Lymphatic: Moderate aortic atherosclerosis. No aneurysm. No suspicious nodes. Reproductive: Status post hysterectomy. No adnexal masses. Other: No free air.  Small to moderate free fluid in the pelvis Musculoskeletal: Scoliosis and degenerative changes. No acute osseous abnormality. IMPRESSION: 1. Multiple dilated fluid-filled loops of  small bowel with relatively decompressed distal small bowel and colon, findings are felt consistent with bowel obstruction though discrete transition point difficult to  visualize. 2. Small amount of abdominopelvic ascites 3. Atrophic kidneys with probable scarring on the right. Electronically Signed   By: Donavan Foil M.D.   On: 10/01/2021 19:04      IMPRESSION AND PLAN:  Active Problems:   SBO (small bowel obstruction) ([place])  1.  Small bowel obstruction. - The patient will be admitted to a medical bed. - She will be kept NPO. - We will hydrate with IV normal saline. - We will follow two-view abdomen x-ray in a.m. - General surgery consult will be obtained. - I notified Dr. Peyton Najjar about the patient.  2.  Essential hypertension, currently uncontrolled. - We will continue amlodipine. - We will hold off HCTZ.  3.  Stage IV chronic kidney disease. - We will follow renal functions with hydration. - We will avoid nephrotoxins.  4.  Vitamin B12 deficiency. - We will continue vitamin B12.   DVT prophylaxis: Lovenox.  Code Status: full code.  Family Communication:  The plan of care was discussed in details with the patient (and family). I answered all questions. The patient agreed to proceed with the above mentioned plan. Further management will depend upon hospital course. Disposition Plan: Back to previous home environment Consults called: none.  All the records are reviewed and case discussed with ED provider.  Status is: Inpatient  Remains inpatient appropriate because:Ongoing diagnostic testing needed not appropriate for outpatient work up, Unsafe d/c plan, IV treatments appropriate due to intensity of illness or inability to take PO, and Inpatient level of care appropriate due to severity of illness   Dispo: The patient is from: Home              Anticipated d/c is to: Home              Patient currently is not medically stable to d/c.              Difficult to place patient: No  TOTAL TIME TAKING CARE OF THIS PATIENT: 55 minutes.     Christel Mormon M.D on 10/01/2021 at 8:39 PM  [facility]   From 7 PM-7 AM, contact  night-coverage [email or web address]  CC: Primary care physician; Baxter Hire, MD

## 2021-10-01 NOTE — ED Notes (Signed)
Lab getting blood

## 2021-10-01 NOTE — ED Provider Notes (Signed)
[facility] Emergency Department Provider Note  ____________________________________________  Time seen: Approximately 8:27 PM  I have reviewed the triage vital signs and the nursing notes.   HISTORY  Chief Complaint Abdominal Pain    HPI Kathy Villanueva is a 87 y.o. adult with a past history of hypertension hyperlipidemia GERD who comes ED complaining of generalized abdominal pain that is constant or worsening for the past 4 to 5 days.  Waxing waning, no aggravating or alleviating factors.  Decreased bowel movements.  Yesterday she started vomiting as well and has not been able to tolerate anything by mouth today.  Denies fever.  No chest pain or shortness of breath.  Pain is nonradiating.    Past Medical History:  Diagnosis Date   Arthritis    hand   GERD (gastroesophageal reflux disease)    Gout    hand   Gross hematuria    Hematoma of kidney    History of kidney stones    HTN (hypertension)    controlled on meds   Hydronephrosis    Hypercholesteremia    Kidney stones    Nausea    Transfusion history    Ureteral stone      Patient Active Problem List   Diagnosis Date Noted   Hematoma of kidney 11/12/2015   History of nephrolithiasis 11/12/2015     Past Surgical History:  Procedure Laterality Date   ABDOMINAL HYSTERECTOMY     CATARACT EXTRACTION W/PHACO Left 10/20/2019   Procedure: CATARACT EXTRACTION PHACO AND INTRAOCULAR LENS PLACEMENT ([place]) LEFT MALYUGIN 01:25.9,  13.6%, 11.71;  Surgeon: Leandrew Koyanagi, MD;  Location: [place];  Service: Ophthalmology;  Laterality: Left;   COLONOSCOPY     KIDNEY STONE SURGERY       Prior to Admission medications   Medication Sig Start Date End Date Taking? Authorizing Provider  amLODipine (NORVASC) 5 MG tablet TAKE 1 TABLET BY MOUTH EACH DAY 10/09/15   [provider]  Cyanocobalamin (RA VITAMIN B-12 TR) 1000 MCG TBCR Take by mouth.    [provider]   hydrochlorothiazide (HYDRODIURIL) 12.5 MG tablet Take by mouth. 07/07/15 10/14/19  [provider]     Allergies Patient has no known allergies.   Family History  Problem Relation Age of Onset   Kidney disease Neg Hx    Bladder Cancer Neg Hx     Social History Social History   Tobacco Use   Smoking status: Never   Smokeless tobacco: Never  Vaping Use   Vaping Use: Never used  Substance Use Topics   Alcohol use: No    Alcohol/week: 0.0 standard drinks   Drug use: No    Review of Systems  Constitutional:   No fever or chills.  ENT:   No sore throat. No rhinorrhea. Cardiovascular:   No chest pain or syncope. Respiratory:   No dyspnea or cough. Gastrointestinal:   Positive as above for abdominal pain and vomiting Musculoskeletal:   Negative for focal pain or swelling All other systems reviewed and are negative except as documented above in ROS and HPI.  ____________________________________________   PHYSICAL EXAM:  VITAL SIGNS: ED Triage Vitals  Enc Vitals Group     BP 10/01/21 1349 (!) 145/69     Pulse Rate 10/01/21 1349 66     Resp 10/01/21 1349 16     Temp 10/01/21 1349 97.8 F (36.6 C)     Temp Source 10/01/21 1349 Oral     SpO2 10/01/21 1349 99 %  Weight --      Height --      Head Circumference --      Peak Flow --      Pain Score 10/01/21 1310 8     Pain Loc --      Pain Edu? --      Excl. in [place]? --     Vital signs reviewed, nursing assessments reviewed.   Constitutional:   Alert and oriented. Non-toxic appearance. Eyes:   Conjunctivae are normal. EOMI. PERRL. ENT      Head:   Normocephalic and atraumatic.      Nose:   Wearing a mask.      Mouth/Throat:   Wearing a mask.      Neck:   No meningismus. Full ROM. Hematological/Lymphatic/Immunilogical:   No cervical lymphadenopathy. Cardiovascular:   RRR. Symmetric bilateral radial and DP pulses.  No murmurs. Cap refill less than 2 seconds. Respiratory:   Normal respiratory effort  without tachypnea/retractions. Breath sounds are clear and equal bilaterally. No wheezes/rales/rhonchi. Gastrointestinal:   Soft with mild generalized tenderness.  Mildly distended. There is no CVA tenderness.  No rebound, rigidity, or guarding. Genitourinary:   deferred Musculoskeletal:   Normal range of motion in all extremities. No joint effusions.  No lower extremity tenderness.  No edema. Neurologic:   Normal speech and language.  Motor grossly intact. No acute focal neurologic deficits are appreciated.  Skin:    Skin is warm, dry and intact. No rash noted.  No petechiae, purpura, or bullae.  ____________________________________________    LABS (pertinent positives/negatives) (all labs ordered are listed, but only abnormal results are displayed) Labs Reviewed  COMPREHENSIVE METABOLIC PANEL - Abnormal; Notable for the following components:      Result Value   BUN 29 (*)    Creatinine, Ser 1.32 (*)    Alkaline Phosphatase 30 (*)    GFR, Estimated 37 (*)    All other components within normal limits  RESP PANEL BY RT-PCR (FLU A&B, COVID) ARPGX2  LIPASE, BLOOD  CBC  URINALYSIS, ROUTINE W REFLEX MICROSCOPIC   ____________________________________________   EKG    ____________________________________________    RADIOLOGY  CT ABDOMEN PELVIS W CONTRAST  Result Date: 10/01/2021 CLINICAL DATA:  Abdomen pain and vomiting EXAM: CT ABDOMEN AND PELVIS WITH CONTRAST TECHNIQUE: Multidetector CT imaging of the abdomen and pelvis was performed using the standard protocol following bolus administration of intravenous contrast. CONTRAST:  43mL OMNIPAQUE IOHEXOL 350 MG/ML SOLN COMPARISON:  CT 04/06/2014 FINDINGS: Lower chest: Lung bases demonstrate no acute consolidation or effusion. Borderline cardiac size. Hepatobiliary: No focal liver abnormality is seen. Status post cholecystectomy. No biliary dilatation. Pancreas: Unremarkable. No pancreatic ductal dilatation or surrounding inflammatory  changes. Spleen: Normal in size without focal abnormality. Adrenals/Urinary Tract: Adrenal glands are within normal limits. Cortical atrophy within the kidneys. Coarse calcification at the upper pole right kidney. Dilated left greater than right renal pelvises without hydroureter or obstructing stone. The bladder is unremarkable. 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 Stomach/Bowel: The stomach is nonenlarged. Fluid-filled dilated mid small bowel measuring up to 3.3 cm. Relatively decompressed distal small bowel and colon, transition point not well-defined. No acute bowel wall thickening. Negative appendix. Vascular/Lymphatic: Moderate aortic atherosclerosis. No aneurysm. No suspicious nodes. Reproductive: Status post hysterectomy. No adnexal masses. Other: No free air.  Small to moderate free fluid in the pelvis Musculoskeletal: Scoliosis and degenerative changes. No acute osseous abnormality. IMPRESSION: 1. Multiple dilated fluid-filled loops of small bowel with relatively decompressed distal small bowel  and colon, findings are felt consistent with bowel obstruction though discrete transition point difficult to visualize. 2. Small amount of abdominopelvic ascites 3. Atrophic kidneys with probable scarring on the right. Electronically Signed   By: Donavan Foil M.D.   On: 10/01/2021 19:04    ____________________________________________   PROCEDURES Procedures  ____________________________________________  DIFFERENTIAL DIAGNOSIS   Diverticulitis, bowel obstruction, appendicitis, viral syndrome  CLINICAL IMPRESSION / ASSESSMENT AND PLAN / ED COURSE  Medications ordered in the ED: Medications  lactated ringers bolus 1,000 mL (0 mLs Intravenous Stopped 10/01/21 1812)  ondansetron (ZOFRAN) injection 4 mg (4 mg Intravenous Given 10/01/21 1652)  iohexol (OMNIPAQUE) 350 MG/ML injection 50 mL (50 mLs Intravenous Contrast Given 10/01/21 1728)    Pertinent labs & imaging results that were  available during my care of the patient were reviewed by me and considered in my medical decision making (see chart for details).  Kathy Villanueva was evaluated in Emergency Department on 10/01/2021 for the symptoms described in the history of present illness. He was evaluated in the context of the global COVID-19 pandemic, which necessitated consideration that the patient might be at risk for infection with the SARS-CoV-2 virus that causes COVID-19. Institutional protocols and algorithms that pertain to the evaluation of patients at risk for COVID-19 are in a state of rapid change based on information released by regulatory bodies including the CDC and federal and state organizations. These policies and algorithms were followed during the patient's care in the ED.   Patient presents with abdominal pain and vomiting.  CT obtained which shows small bowel obstruction without transition point.  Stomach is described as nonenlarged, but with her vomiting I think inserting NG tube to low intermittent suction would be prudent to decrease aspiration risk.  Case discussed with hospitalist for further management.      ____________________________________________   FINAL CLINICAL IMPRESSION(S) / ED DIAGNOSES    Final diagnoses:  SBO (small bowel obstruction) [facility])     ED Discharge Orders     None       Portions of this note were generated with dragon dictation software. Dictation errors may occur despite best attempts at proofreading.    Carrie Mew, MD 10/01/21 2107

## 2021-10-01 NOTE — ED Triage Notes (Signed)
Pt comes with c/o abdominal pain and vomited clear liquid last night. VSS

## 2021-10-01 NOTE — ED Notes (Signed)
Visitor at bedside.

## 2021-10-01 NOTE — ED Notes (Addendum)
See triage note. Pt laying calmly in bed; skin dry; resp reg/unlabored. Pt denies major nausea currently; states it is present but tolerable; abdominal tenderness noted especially at [facility] per pt; pt denies changes in her BM and urination. Pt not currently vomiting.

## 2021-10-01 NOTE — ED Notes (Signed)
Pt away at imaging. Will collect covid swab once pt back to ER.

## 2021-10-01 NOTE — ED Notes (Signed)
Patient and family member made aware.  All questions answered.  Patient repositioned in bed.

## 2021-10-01 NOTE — ED Notes (Signed)
Pt denies any needs currently.  

## 2021-10-02 ENCOUNTER — Inpatient Hospital Stay: Payer: Medicare HMO

## 2021-10-02 ENCOUNTER — Encounter: Payer: Self-pay | Admitting: Family Medicine

## 2021-10-02 DIAGNOSIS — K56609 Unspecified intestinal obstruction, unspecified as to partial versus complete obstruction: Secondary | ICD-10-CM | POA: Diagnosis not present

## 2021-10-02 LAB — URINALYSIS, ROUTINE W REFLEX MICROSCOPIC
Bacteria, UA: NONE SEEN
Bilirubin Urine: NEGATIVE
Glucose, UA: NEGATIVE mg/dL
Hgb urine dipstick: NEGATIVE
Ketones, ur: 5 mg/dL — AB
Nitrite: NEGATIVE
Protein, ur: 30 mg/dL — AB
Specific Gravity, Urine: 1.036 — ABNORMAL HIGH (ref 1.005–1.030)
pH: 5 (ref 5.0–8.0)

## 2021-10-02 LAB — CBC
HCT: 36.8 % (ref 36.0–46.0)
Hemoglobin: 12.2 g/dL (ref 12.0–15.0)
MCH: 30.3 pg (ref 26.0–34.0)
MCHC: 33.2 g/dL (ref 30.0–36.0)
MCV: 91.3 fL (ref 80.0–100.0)
Platelets: 205 10*3/uL (ref 150–400)
RBC: 4.03 MIL/uL (ref 3.87–5.11)
RDW: 14.4 % (ref 11.5–15.5)
WBC: 6.7 10*3/uL (ref 4.0–10.5)
nRBC: 0 % (ref 0.0–0.2)

## 2021-10-02 LAB — BASIC METABOLIC PANEL
Anion gap: 11 (ref 5–15)
BUN: 32 mg/dL — ABNORMAL HIGH (ref 8–23)
CO2: 23 mmol/L (ref 22–32)
Calcium: 8.4 mg/dL — ABNORMAL LOW (ref 8.9–10.3)
Chloride: 101 mmol/L (ref 98–111)
Creatinine, Ser: 1.21 mg/dL — ABNORMAL HIGH (ref 0.44–1.00)
GFR, Estimated: 41 mL/min — ABNORMAL LOW (ref >60)
Glucose, Bld: 80 mg/dL (ref 70–99)
Potassium: 3.9 mmol/L (ref 3.5–5.1)
Sodium: 135 mmol/L (ref 135–145)

## 2021-10-02 LAB — MAGNESIUM: Magnesium: 1.5 mg/dL — ABNORMAL LOW (ref 1.7–2.4)

## 2021-10-02 LAB — PHOSPHORUS: Phosphorus: 4 mg/dL (ref 2.5–4.6)

## 2021-10-02 MED ORDER — ENOXAPARIN SODIUM 30 MG/0.3ML IJ SOSY
30.0000 mg | PREFILLED_SYRINGE | INTRAMUSCULAR | Status: DC
  Administered 2021-10-02 – 2021-10-14 (×13): 30 mg via SUBCUTANEOUS
  Filled 2021-10-02 (×14): qty 0.3

## 2021-10-02 MED ORDER — DIATRIZOATE MEGLUMINE & SODIUM 66-10 % PO SOLN
90.0000 mL | Freq: Once | ORAL | Status: AC
  Administered 2021-10-02: 90 mL via NASOGASTRIC

## 2021-10-02 NOTE — Progress Notes (Signed)
Anticoagulation monitoring(Lovenox):  87 yo  female ordered Lovenox 40 mg Q24h    Filed Weights   10/02/21 0458  Weight: 42.8 kg (94 lb 6.6 oz)   BMI 16.2   Lab Results  Component Value Date   CREATININE 1.32 (H) 10/01/2021   CREATININE 1.73 (H) 06/27/2014   CREATININE 1.32 (H) 10/20/2013   Estimated Creatinine Clearance (by C-G formula based on SCr of 1.32 mg/dL (H)) Female: 17.2 mL/min (A) Female: 20.3 mL/min (A) Hemoglobin & Hematocrit     Component Value Date/Time   HGB 12.3 10/01/2021 1434   HGB 10.9 (L) 06/27/2014 1246   HCT 38.1 10/01/2021 1434   HCT 34.2 (L) 06/27/2014 1246     Per Protocol for Patient with estCrcl < 30 ml/min and BMI < 40, will transition to Lovenox 30 mg Q24h.

## 2021-10-02 NOTE — Progress Notes (Signed)
Daughter updated via telephone of patients room assignment and status.

## 2021-10-02 NOTE — Progress Notes (Signed)
PROGRESS NOTE    Kathy Villanueva  KDX:[id number] DOB: [birth year] DOA: 10/01/2021 PCP: Baxter Hire, MD   Brief Narrative:  The patient is a 87 year old elderly African-American female with a past medical history significant for but not limited to hypertension, dyslipidemia, gout, GERD, osteoarthritis as well as other comorbidities who presented to the ED with acute onset of generalized abdominal pain that was worsening over last 4 to 5 days and has been intermittent with diminished bowel movements.  The day before yesterday she started vomiting as well and has not been able to tolerate anything by mouth.  She denies any fevers or chills and denied diarrhea and her last movement was the day before yesterday.  Because of her abdominal pain and nausea vomiting she presented to emergency room for further evaluation found to have slightly elevated blood pressure and a BUN/creatinine of 29/1.32.  She underwent abdominal imaging which showed multiple dilated fluid-filled loops of small bowel with relatively decompressed distal small bowel: Findings were consistent with a bowel obstruction 2 though the discrete transition point was difficult to visualize and she did have a small amount of abdominal pelvic ascites and atrophic kidneys with probable scarring on the right.  She then had abdominal x-ray after NG tube was placed and it showed position in the body of the stomach and findings were consistent with a small bowel obstruction still.  He was given 1 L IV lactated Ringer's and Zofran and placed on maintenance IV fluids which were then reduced to 75 MLS per hour.  Given her small bowel obstruction General surgery was consulted and they felt that her small bowel obstruction was secondary to adhesions.  Repeat abdominal imaging shows persistent small bowel dilatation and general surgery is ordering a Gastrografin challenge for further evaluation and recommending serial physical exam as well as abdominal imaging.   If not improved with conservative measures she will likely need surgical management during this admission.  Assessment & Plan:   Active Problems:   SBO (small bowel obstruction) (HCC)  Acute small bowel obstruction in the setting of adhesions from prior surgical procedures -She is admitted to medical bed as an inpatient -We will keep her strict n.p.o. and advance diet per general surgery recommendations -She was hydrated with normal saline but will reduce rate from 100 MLS per hour to 75 MLS per hour; given a liter of lactated Ringer's bolus in the ED -CT scan of the abdomen pelvis showed "Multiple dilated fluid-filled loops of small bowel with relatively decompressed distal small bowel and colon, findings are felt consistent with bowel obstruction though discrete transition point difficult to visualize. Small amount of  abdominopelvic ascites. Atrophic kidneys with probable scarring on the right." -Repeat KUB this a.m. showed "Nasogastric tube extends into the mid body of the stomach. Multiple dilated loops of gas-filled small bowel are noted measuring up to 4.3 cm in diameter. There is a small amount of colonic gas and stool. No pneumoperitoneum. Surgical clips project over the right upper quadrant of the abdomen, from prior cholecystectomy. Iodinated contrast material within the lumen of the urinary bladder -Continue with supportive care and antiemetics with ondansetron 4 mg p.o./IV every 6 as needed for nausea -General surgery was consulted and they are planning on a Gastrografin challenge and recommending serial abdominal exams as well as imaging -Initial magnesium level is above 2 and potassium level is above 4 -Ambulate  -If she fails conservative measures she may need surgical intervention this admission  Essential Hypertension -Her amlodipine was  continued on admission but we have discontinued this given that she is n.p.o. and has NG tube in place -Hold oral antihypertensives including  amlodipine and hydrochlorothiazide -If necessary will add IV hydralazine 10 mg every 6 as needed for systolic blood pressure in 951 or diastolic blood pressure greater 100 -Continue monitor blood pressures per protocol -Last blood pressure reading was 146/78  CKD stage Stage 3b -She was given a liter of lactated Ringer's bolus in the ED and then placed on maintenance IV fluids at 100 MLS per hour which we have now reduced to 75 MLS per hour -Patient's BUNs/creatinine went from 29/1.32 -> 32/1.21 -Avoid further nephrotoxic medications, contrast dyes, hypotension and dehydration and renally adjust medications -Repeat CMP in the a.m.  Vitamin B12 Deficiency The-she is continued on her oral vitamin B12 which she had not discontinue given that she is n.p.o. -Resume vitamin B12 when she is tolerating her diet better  HLD -Does not appear to be on a Statin   GERD -Does not appear to be on a PPI or H2 Blocker  Underweight -Estimated body mass index is 16.21 kg/m as calculated from the following:   Height as of this encounter: 5\' 4"  (1.626 m).   Weight as of this encounter: 42.8 kg. -Dialectician consulted for further evaluation and recommendations   DVT prophylaxis: SCDs Code Status: FULL CODE Family Communication: No family present at bedside Disposition Plan: Pending further clinical improvement and clearance by general surgery; she will need to tolerate a diet prior to discharging and had PT OT to further evaluate and treat  Status is: Inpatient  Remains inpatient appropriate because: Still has persistent SBO and not safe to swallow   Consultants:  General Surgery   Procedures: Gastrograffin Challenge   Antimicrobials:  Anti-infectives (From admission, onward)    None        Subjective: Seen and examined at bedside and she is very withdrawn and has some mild abdominal discomfort.  Wanted to rest and sleep.  No chest pain or shortness of breath.  Has NG tube in place in  the right nare.  No other concerns or complaints at this time.  Objective: Vitals:   10/02/21 0458 10/02/21 0530 10/02/21 0600 10/02/21 0730  BP:  (!) 156/90 (!) 149/85 (!) 146/78  Pulse:   79 92  Resp:   17 19  Temp:    97.7 F (36.5 C)  TempSrc:    Oral  SpO2:   96% 96%  Weight: 42.8 kg     Height: 5\' 4"  (1.626 m)       Intake/Output Summary (Last 24 hours) at 10/02/2021 8841 Last data filed at 10/02/2021 0500 Gross per 24 hour  Intake --  Output 200 ml  Net -200 ml   Filed Weights   10/02/21 0458  Weight: 42.8 kg   Examination: Physical Exam:  Constitutional: Thin frail and cachectic chronically ill-appearing African-American female Eyes: Lids are normal. ENMT: External Ears, Nose appear normal and has an NG tube placed grossly normal hearing.  Neck: Appears normal, supple, no cervical masses, normal ROM, no appreciable thyromegaly Respiratory: Diminished to auscultation bilaterally, no wheezing, rales, rhonchi or crackles. Normal respiratory effort and patient is not tachypenic. No accessory muscle use.  Unlabored breathing Cardiovascular: RRR, no murmurs / rubs / gallops. S1 and S2 auscultated.  No appreciable extremity edema Abdomen: Soft, mildly-tender, slightly distended. Bowel sounds diminished GU: Deferred. Musculoskeletal: No clubbing / cyanosis of digits/nails. No joint deformity upper and lower extremities.  Skin:  No rashes, lesions, ulcers on limited skin evaluation. No induration; Warm and dry.  Neurologic: CN 2-12 grossly intact with no focal deficits. Romberg sign and cerebellar reflexes not assessed.  Psychiatric: Slightly impaired judgment and insight.  She is somnolent and drowsy and wanting to rest and mumbles some of her words.  Data Reviewed: I have personally reviewed following labs and imaging studies  CBC: Recent Labs  Lab 10/01/21 1434 10/02/21 0633  WBC 9.0 6.7  HGB 12.3 12.2  HCT 38.1 36.8  MCV 91.8 91.3  PLT 229 017   Basic  Metabolic Panel: Recent Labs  Lab 10/01/21 1434 10/02/21 0633  NA 135 135  K 4.4 3.9  CL 99 101  CO2 25 23  GLUCOSE 99 80  BUN 29* 32*  CREATININE 1.32* 1.21*  CALCIUM 9.2 8.4*   GFR: Estimated Creatinine Clearance (by C-G formula based on SCr of 1.21 mg/dL (H)) Female: 18.8 mL/min (A) Female: 22.1 mL/min (A) Liver Function Tests: Recent Labs  Lab 10/01/21 1434  AST 25  ALT 14  ALKPHOS 30*  BILITOT 1.0  PROT 7.0  ALBUMIN 3.9   Recent Labs  Lab 10/01/21 1434  LIPASE 34   No results for input(s): AMMONIA in the last 168 hours. Coagulation Profile: No results for input(s): INR, PROTIME in the last 168 hours. Cardiac Enzymes: No results for input(s): CKTOTAL, CKMB, CKMBINDEX, TROPONINI in the last 168 hours. BNP (last 3 results) No results for input(s): PROBNP in the last 8760 hours. HbA1C: No results for input(s): HGBA1C in the last 72 hours. CBG: No results for input(s): GLUCAP in the last 168 hours. Lipid Profile: No results for input(s): CHOL, HDL, LDLCALC, TRIG, CHOLHDL, LDLDIRECT in the last 72 hours. Thyroid Function Tests: No results for input(s): TSH, T4TOTAL, FREET4, T3FREE, THYROIDAB in the last 72 hours. Anemia Panel: No results for input(s): VITAMINB12, FOLATE, FERRITIN, TIBC, IRON, RETICCTPCT in the last 72 hours. Sepsis Labs: No results for input(s): PROCALCITON, LATICACIDVEN in the last 168 hours.  Recent Results (from the past 240 hour(s))  Resp Panel by RT-PCR (Flu A&B, Covid) Nasopharyngeal Swab     Status: None   Collection Time: 10/01/21  3:52 PM   Specimen: Nasopharyngeal Swab; Nasopharyngeal(NP) swabs in vial transport medium  Result Value Ref Range Status   SARS Coronavirus 2 by RT PCR NEGATIVE NEGATIVE Final    Comment: (NOTE) SARS-CoV-2 target nucleic acids are NOT DETECTED.  The SARS-CoV-2 RNA is generally detectable in upper respiratory specimens during the acute phase of infection. The lowest concentration of SARS-CoV-2 viral  copies this assay can detect is 138 copies/mL. A negative result does not preclude SARS-Cov-2 infection and should not be used as the sole basis for treatment or other patient management decisions. A negative result may occur with  improper specimen collection/handling, submission of specimen other than nasopharyngeal swab, presence of viral mutation(s) within the areas targeted by this assay, and inadequate number of viral copies(<138 copies/mL). A negative result must be combined with clinical observations, patient history, and epidemiological information. The expected result is Negative.  Fact Sheet for Patients:  EntrepreneurPulse.com.au  Fact Sheet for Healthcare Providers:  IncredibleEmployment.be  This test is no t yet approved or cleared by the [place] FDA and  has been authorized for detection and/or diagnosis of SARS-CoV-2 by FDA under an Emergency Use Authorization (EUA). This EUA will remain  in effect (meaning this test can be used) for the duration of the COVID-19 declaration under Section 564(b)(1) of the Act,  21 U.S.C.section 360bbb-3(b)(1), unless the authorization is terminated  or revoked sooner.       Influenza A by PCR NEGATIVE NEGATIVE Final   Influenza B by PCR NEGATIVE NEGATIVE Final    Comment: (NOTE) The Xpert Xpress SARS-CoV-2/FLU/RSV plus assay is intended as an aid in the diagnosis of influenza from Nasopharyngeal swab specimens and should not be used as a sole basis for treatment. Nasal washings and aspirates are unacceptable for Xpert Xpress SARS-CoV-2/FLU/RSV testing.  Fact Sheet for Patients: EntrepreneurPulse.com.au  Fact Sheet for Healthcare Providers: IncredibleEmployment.be  This test is not yet approved or cleared by the [place] FDA and has been authorized for detection and/or diagnosis of SARS-CoV-2 by FDA under an Emergency Use Authorization (EUA). This  EUA will remain in effect (meaning this test can be used) for the duration of the COVID-19 declaration under Section 564(b)(1) of the Act, 21 U.S.C. section 360bbb-3(b)(1), unless the authorization is terminated or revoked.  Performed at [facility], [address] [place], [place] [address]     RN Pressure Injury Documentation:     Estimated body mass index is 16.21 kg/m as calculated from the following:   Height as of this encounter: 5\' 4"  (1.626 m).   Weight as of this encounter: 42.8 kg.  Malnutrition Type:   Malnutrition Characteristics:   Nutrition Interventions:    Radiology Studies: DG Abdomen 1 View  Result Date: 10/01/2021 CLINICAL DATA:  NG tube placement EXAM: ABDOMEN - 1 VIEW COMPARISON:  Abdomen and pelvis CT, dated October 01, 2021 FINDINGS: Nasogastric tube is seen with its distal tip overlying the lateral aspect of the left upper quadrant. Versus likely within the body of the stomach. Multiple dilated small bowel loops are seen throughout the upper abdomen. Radiopaque surgical clips are seen within the right upper quadrant. No radio-opaque calculi or other significant radiographic abnormality are seen. IMPRESSION: 1. Nasogastric tube positioning, as described above. 2. Findings consistent with a small bowel obstruction. This correlates to findings noted on the prior abdomen pelvis CT. Electronically Signed   By: Virgina Norfolk M.D.   On: 10/01/2021 21:13   CT ABDOMEN PELVIS W CONTRAST  Result Date: 10/01/2021 CLINICAL DATA:  Abdomen pain and vomiting EXAM: CT ABDOMEN AND PELVIS WITH CONTRAST TECHNIQUE: Multidetector CT imaging of the abdomen and pelvis was performed using the standard protocol following bolus administration of intravenous contrast. CONTRAST:  22mL OMNIPAQUE IOHEXOL 350 MG/ML SOLN COMPARISON:  CT 04/06/2014 FINDINGS: Lower chest: Lung bases demonstrate no acute consolidation or effusion. Borderline cardiac size. Hepatobiliary: No focal  liver abnormality is seen. Status post cholecystectomy. No biliary dilatation. Pancreas: Unremarkable. No pancreatic ductal dilatation or surrounding inflammatory changes. Spleen: Normal in size without focal abnormality. Adrenals/Urinary Tract: Adrenal glands are within normal limits. Cortical atrophy within the kidneys. Coarse calcification at the upper pole right kidney. Dilated left greater than right renal pelvises without hydroureter or obstructing stone. The bladder is unremarkable. 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 Stomach/Bowel: The stomach is nonenlarged. Fluid-filled dilated mid small bowel measuring up to 3.3 cm. Relatively decompressed distal small bowel and colon, transition point not well-defined. No acute bowel wall thickening. Negative appendix. Vascular/Lymphatic: Moderate aortic atherosclerosis. No aneurysm. No suspicious nodes. Reproductive: Status post hysterectomy. No adnexal masses. Other: No free air.  Small to moderate free fluid in the pelvis Musculoskeletal: Scoliosis and degenerative changes. No acute osseous abnormality. IMPRESSION: 1. Multiple dilated fluid-filled loops of small bowel with relatively decompressed distal small bowel and colon, findings are felt  consistent with bowel obstruction though discrete transition point difficult to visualize. 2. Small amount of abdominopelvic ascites 3. Atrophic kidneys with probable scarring on the right. Electronically Signed   By: Donavan Foil M.D.   On: 10/01/2021 19:04   DG Abd 2 Views  Result Date: 10/02/2021 CLINICAL DATA:  87 year old female with history of small-bowel obstruction. EXAM: ABDOMEN - 2 VIEW COMPARISON:  Abdominal radiograph 10/01/2021. FINDINGS: Nasogastric tube extends into the mid body of the stomach. Multiple dilated loops of gas-filled small bowel are noted measuring up to 4.3 cm in diameter. There is a small amount of colonic gas and stool. No pneumoperitoneum. Surgical clips project over the right  upper quadrant of the abdomen, from prior cholecystectomy. Iodinated contrast material within the lumen of the urinary bladder. IMPRESSION: 1. Support apparatus and postoperative changes, as above. 2. Persistent small bowel obstruction. Electronically Signed   By: Vinnie Langton M.D.   On: 10/02/2021 07:50    Scheduled Meds:  diatrizoate meglumine-sodium  90 mL Per NG tube Once   enoxaparin (LOVENOX) injection  30 mg Subcutaneous Q24H   Continuous Infusions:  sodium chloride 100 mL/hr at 10/01/21 2324    LOS: 1 day   Kerney Elbe, DO [facility] PAGER is on [place]  If 7PM-7AM, please contact night-coverage [email or web address]

## 2021-10-02 NOTE — Plan of Care (Signed)
  Problem: Education: Goal: Knowledge of General Education information will improve Description: Including pain rating scale, medication(s)/side effects and non-pharmacologic comfort measures Outcome: Progressing   Problem: Health Behavior/Discharge Planning: Goal: Ability to manage health-related needs will improve Outcome: Progressing   Problem: Clinical Measurements: Goal: Ability to maintain clinical measurements within normal limits will improve Outcome: Progressing Goal: Will remain free from infection Outcome: Progressing Goal: Respiratory complications will improve Outcome: Progressing Goal: Cardiovascular complication will be avoided Outcome: Progressing   Problem: Activity: Goal: Risk for activity intolerance will decrease Outcome: Progressing   Problem: Nutrition: Goal: Adequate nutrition will be maintained Outcome: Not Progressing   Problem: Coping: Goal: Level of anxiety will decrease Outcome: Progressing   Problem: Elimination: Goal: Will not experience complications related to bowel motility Outcome: Progressing Goal: Will not experience complications related to urinary retention Outcome: Progressing   Problem: Pain Managment: Goal: General experience of comfort will improve Outcome: Progressing   Problem: Safety: Goal: Ability to remain free from injury will improve Outcome: Progressing   Problem: Skin Integrity: Goal: Risk for impaired skin integrity will decrease Outcome: Progressing

## 2021-10-02 NOTE — Consult Note (Signed)
SURGICAL CONSULTATION NOTE   HISTORY OF PRESENT ILLNESS (HPI):  87 y.o. adult presented to [facility] ED for evaluation of abdominal pain.  Patient poor historian and was not able to tell me the duration of his abdominal pain.  As per chart it is reported that the pain has been for 4 to 5 days.  The patient reports that the pain is nonlocalized, mainly generalized.  No pain radiation.  There has been no alleviating or aggravating factors.  Patient endorses having nausea and vomiting.  No nausea or vomiting after placement of NGT.  At the ED he was found with no leukocytosis, adequate hemoglobin, adequate renal function for his baseline.  CT scan of the abdomen and pelvis shows multiple loops of small bowel dilation consistent with suspected small bowel obstruction.  No free air or free fluid.  I personally evaluated the images.  Surgery is consulted by Dr. Sidney Ace in this context for evaluation and management of small bowel obstruction.  PAST MEDICAL HISTORY (PMH):  Past Medical History:  Diagnosis Date   Arthritis    hand   GERD (gastroesophageal reflux disease)    Gout    hand   Gross hematuria    Hematoma of kidney    History of kidney stones    HTN (hypertension)    controlled on meds   Hydronephrosis    Hypercholesteremia    Kidney stones    Nausea    Transfusion history    Ureteral stone      PAST SURGICAL HISTORY ([place]):  Past Surgical History:  Procedure Laterality Date   ABDOMINAL HYSTERECTOMY     CATARACT EXTRACTION W/PHACO Left 10/20/2019   Procedure: CATARACT EXTRACTION PHACO AND INTRAOCULAR LENS PLACEMENT ([place]) LEFT MALYUGIN 01:25.9,  13.6%, 11.71;  Surgeon: Leandrew Koyanagi, MD;  Location: [place];  Service: Ophthalmology;  Laterality: Left;   COLONOSCOPY     KIDNEY STONE SURGERY       MEDICATIONS:  Prior to Admission medications   Medication Sig Start Date End Date Taking? Authorizing Provider  amLODipine (NORVASC) 5 MG tablet TAKE 1 TABLET BY MOUTH  EACH DAY 10/09/15  Yes [provider]  cyanocobalamin 1000 MCG tablet Take by mouth. 02/15/20  Yes [provider]  Cyanocobalamin 1000 MCG TBCR Take by mouth.   Yes [provider]  hydrochlorothiazide (HYDRODIURIL) 12.5 MG tablet Take by mouth. 07/07/15 10/14/19  [provider]  molnupiravir EUA (LAGEVRIO) 200 MG CAPS capsule Take 4 capsules by mouth 2 (two) times daily. Patient not taking: No sig reported 04/19/21   [provider]     ALLERGIES:  No Known Allergies   SOCIAL HISTORY:  Social History   Socioeconomic History   Marital status: Widowed    Spouse name: Not on file   Number of children: Not on file   Years of education: Not on file   Highest education level: Not on file  Occupational History   Not on file  Tobacco Use   Smoking status: Never   Smokeless tobacco: Never  Vaping Use   Vaping Use: Never used  Substance and Sexual Activity   Alcohol use: No    Alcohol/week: 0.0 standard drinks   Drug use: No   Sexual activity: Not on file  Other Topics Concern   Not on file  Social History Narrative   Not on file   Social Determinants of Health   Financial Resource Strain: Not on file  Food Insecurity: Not on file  Transportation Needs: Not  on file  Physical Activity: Not on file  Stress: Not on file  Social Connections: Not on file  Intimate Partner Violence: Not on file      FAMILY HISTORY:  Family History  Problem Relation Age of Onset   Kidney disease Neg Hx    Bladder Cancer Neg Hx      REVIEW OF SYSTEMS:  Constitutional: denies weight loss, fever, chills, or sweats  Eyes: denies any other vision changes, history of eye injury  ENT: denies sore throat, hearing problems  Respiratory: denies shortness of breath, wheezing  Cardiovascular: denies chest pain, palpitations  Gastrointestinal: Positive abdominal pain, nausea and vomiting Genitourinary: denies burning with urination or urinary  frequency Musculoskeletal: denies any other joint pains or cramps  Skin: denies any other rashes or skin discolorations  Neurological: denies any other headache, dizziness, weakness  Psychiatric: denies any other depression, anxiety   All other review of systems were negative   VITAL SIGNS:  Temp:  [97.7 F (36.5 C)-97.8 F (36.6 C)] 97.7 F (36.5 C) (10/25 0730) Pulse Rate:  [48-92] 92 (10/25 0730) Resp:  [16-19] 19 (10/25 0730) BP: (121-170)/(69-101) 149/85 (10/25 0600) SpO2:  [95 %-100 %] 96 % (10/25 0730) Weight:  [42.8 kg] 42.8 kg (10/25 0458)     Height: 5\' 4"  (162.6 cm) Weight: 42.8 kg BMI (Calculated): 16.2   INTAKE/OUTPUT:  This shift: No intake/output data recorded.  Last 2 shifts: @IOLAST2SHIFTS @   PHYSICAL EXAM:  Constitutional:  -- Normal body habitus  -- Awake, alert, and oriented x3  Eyes:  -- Pupils equally round and reactive to light  -- No scleral icterus  Ear, nose, and throat:  -- No jugular venous distension  Pulmonary:  -- No crackles  -- Equal breath sounds bilaterally -- Breathing non-labored at rest Cardiovascular:  -- S1, S2 present  -- No pericardial rubs Gastrointestinal:  -- Abdomen soft, mild tender lower abdomen, non-distended, no guarding or rebound tenderness -- No abdominal masses appreciated, pulsatile or otherwise  Musculoskeletal and Integumentary:  -- Wounds: None appreciated -- Extremities: B/L UE and LE FROM, hands and feet warm, no edema  Neurologic:  -- Motor function: intact and symmetric -- Sensation: intact and symmetric   Labs:  CBC Latest Ref Rng & Units 10/01/2021 06/27/2014 10/20/2013  WBC 4.0 - 10.5 K/uL 9.0 6.9 7.5  Hemoglobin 12.0 - 15.0 g/dL 12.3 10.9(L) 12.2  Hematocrit 36.0 - 46.0 % 38.1 34.2(L) 36.5  Platelets 150 - 400 K/uL 229 218 206   CMP Latest Ref Rng & Units 10/01/2021 06/27/2014 10/20/2013  Glucose 70 - 99 mg/dL 99 126(H) 94  BUN 8 - 23 mg/dL 29(H) 26(H) 20(H)  Creatinine 0.44 - 1.00 mg/dL  1.32(H) 1.73(H) 1.32(H)  Sodium 135 - 145 mmol/L 135 136 139  Potassium 3.5 - 5.1 mmol/L 4.4 3.7 3.8  Chloride 98 - 111 mmol/L 99 103 109(H)  CO2 22 - 32 mmol/L 25 23 25   Calcium 8.9 - 10.3 mg/dL 9.2 8.6 8.1(L)  Total Protein 6.5 - 8.1 g/dL 7.0 - 6.5  Total Bilirubin 0.3 - 1.2 mg/dL 1.0 - 0.3  Alkaline Phos 38 - 126 U/L 30(L) - 54  AST 15 - 41 U/L 25 - 29  ALT 0 - 44 U/L 14 - 19    Imaging studies:  EXAM: CT ABDOMEN AND PELVIS WITH CONTRAST   TECHNIQUE: Multidetector CT imaging of the abdomen and pelvis was performed using the standard protocol following bolus administration of intravenous contrast.   CONTRAST:  70mL  OMNIPAQUE IOHEXOL 350 MG/ML SOLN   COMPARISON:  CT 04/06/2014   FINDINGS: Lower chest: Lung bases demonstrate no acute consolidation or effusion. Borderline cardiac size.   Hepatobiliary: No focal liver abnormality is seen. Status post cholecystectomy. No biliary dilatation.   Pancreas: Unremarkable. No pancreatic ductal dilatation or surrounding inflammatory changes.   Spleen: Normal in size without focal abnormality.   Adrenals/Urinary Tract: Adrenal glands are within normal limits. Cortical atrophy within the kidneys. Coarse calcification at the upper pole right kidney. Dilated left greater than right renal pelvises without hydroureter or obstructing stone. The bladder is unremarkable. 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   Stomach/Bowel: The stomach is nonenlarged. Fluid-filled dilated mid small bowel measuring up to 3.3 cm. Relatively decompressed distal small bowel and colon, transition point not well-defined. No acute bowel wall thickening. Negative appendix.   Vascular/Lymphatic: Moderate aortic atherosclerosis. No aneurysm. No suspicious nodes.   Reproductive: Status post hysterectomy. No adnexal masses.   Other: No free air.  Small to moderate free fluid in the pelvis   Musculoskeletal: Scoliosis and degenerative changes. No  acute osseous abnormality.   IMPRESSION: 1. Multiple dilated fluid-filled loops of small bowel with relatively decompressed distal small bowel and colon, findings are felt consistent with bowel obstruction though discrete transition point difficult to visualize. 2. Small amount of abdominopelvic ascites 3. Atrophic kidneys with probable scarring on the right.     Electronically Signed   By: Donavan Foil M.D.   On: 10/01/2021 19:04  Assessment/Plan:  87 y.o. adult with small bowel obstruction, complicated by pertinent comorbidities including hypertension, dyslipidemia, gout and GERD.  Patient with history, physical exam and imaging consistent with small bowel obstruction.  Patient with history of hysterectomy.  Small bowel obstruction most likely due to adhesions.  Patient already with NGT in place.  Unknown output since it was not charted.  Abdominal x-ray this morning shows persistent small bowel dilation.  I will order Gastrografin challenge for further evaluation.  I will continue to follow with serial physical exams and abdominal x-rays.  No family at bedside.  I notified the patient that most of the time this bowel obstruction resolved with conservative management but if no improvement he will need surgical management during admission.  Arnold Long, MD

## 2021-10-03 ENCOUNTER — Inpatient Hospital Stay: Payer: Medicare HMO

## 2021-10-03 DIAGNOSIS — E43 Unspecified severe protein-calorie malnutrition: Secondary | ICD-10-CM | POA: Insufficient documentation

## 2021-10-03 DIAGNOSIS — K56609 Unspecified intestinal obstruction, unspecified as to partial versus complete obstruction: Secondary | ICD-10-CM | POA: Diagnosis not present

## 2021-10-03 MED ORDER — MORPHINE SULFATE (PF) 2 MG/ML IV SOLN
2.0000 mg | INTRAVENOUS | Status: DC | PRN
  Administered 2021-10-03 – 2021-10-12 (×7): 2 mg via INTRAVENOUS
  Filled 2021-10-03 (×8): qty 1

## 2021-10-03 MED ORDER — MAGNESIUM SULFATE 2 GM/50ML IV SOLN
2.0000 g | Freq: Once | INTRAVENOUS | Status: AC
  Administered 2021-10-03: 2 g via INTRAVENOUS
  Filled 2021-10-03: qty 50

## 2021-10-03 MED ORDER — METOCLOPRAMIDE HCL 5 MG/ML IJ SOLN
5.0000 mg | Freq: Once | INTRAMUSCULAR | Status: AC
  Administered 2021-10-03: 5 mg via INTRAVENOUS
  Filled 2021-10-03: qty 2

## 2021-10-03 NOTE — Progress Notes (Signed)
PROGRESS NOTE    Kathy Villanueva  JSE:[id number] DOB: 1924-11-14 DOA: 10/01/2021 PCP: Baxter Hire, MD    Brief Narrative:  Kathy Villanueva is a 87 year old female with past medical history significant for essential hypertension, dyslipidemia, gout, GERD, osteoarthritis who presented to [facility] ED on 10/24 with generalized abdominal pain.  Progressing over the last 4-5 days with associated nausea and vomiting.  Patient also with diminished bowel movements.  Denies fever/chills, no chest pain or palpitations, no diarrhea.  In the ED, BP 145/69, otherwise vital signs within normal limits.  Labs revealed a BUN of 29 with a creatinine of 1.32.  CBC unrevealing.  Influenza A/B PCR negative.  Covid-19 PCR negative.  CT abdomen/pelvis with multiple dilated fluid-filled loops of small bowel with relatively decompressed distal small bowel and colon consistent with bowel obstruction; although discrete transition point difficult to visualize.  NG tube was placed.  General surgery was consulted.  TRH consulted for further evaluation and management of small bowel obstruction.   Assessment & Plan:   Active Problems:   SBO (small bowel obstruction) (HCC)   Protein-calorie malnutrition, severe   Small bowel obstruction Patient presenting to the ED with abdominal pain, abdominal distention with associated nausea/vomiting and decreased bowel movements.  CT abdomen/pelvis notable for multiple dilated fluid-filled loops of small bowel with relatively decompressed distal small bowel and colon consistent with bowel obstruction; although discrete transition point difficult to visualize. --General surgery following, appreciate assistance --Repeat KUB today with continued dilated small bowel loops; consistent with persistent obstruction --NPO; Continue NG tube to LWIS --IVF w/ NS at 30mL/hr --If no further progress over the next 24 hours, general surgery considering surgical intervention  Hypomagnesemia Magnesium  1.5 this morning, will replete. --Repeat electrolytes in a.m.  Essential hypertension At home on amlodipine and hydrochlorothiazide. --Holding oral antihypertensives given n.p.o. and NG tube to suction --Hydralazine 10 mg IV q6h prn  CKD stage IIIb --Cr 1.32>1.21; at baseline --BMP daily  Vitamin B12 deficiency --Continue to hold oral vitamin B12 supplement while on p.o.  HLD: Not currently on statin outpatient  Severe protein calorie malnutrition Body mass index is 16.21 kg/m. Nutrition Status: Nutrition Problem: Severe Malnutrition Etiology: social / environmental circumstances Signs/Symptoms: severe fat depletion, severe muscle depletion  --Dietitian following, currently n.p.o. with small bowel obstruction, will likely need further supplementation when able    DVT prophylaxis: enoxaparin (LOVENOX) injection 30 mg Start: 10/02/21 0800   Code Status: Full Code Family Communication: Updated patient's sister-in-law present at bedside this morning  Disposition Plan:  Level of care: Med-Surg Status is: Inpatient  Remains inpatient appropriate because: Continued small bowel obstruction with NG tube to low wall suction, possible need of surgical intervention     Consultants:  General surgery  Procedures:  NG tube placement  Antimicrobials:  None   Subjective: Patient seen examined bedside, resting comfortably.  Daughter-in-law present.  Continues without flatus or bowel movement.  Continues with NG tube to low wall intermittent suction.  OT present at bedside and patient continues with considerate abdominal pain with movement.  No other specific questions or concerns at this time.  Seen by general surgery with likely need to proceed with surgical invention if no improvement in the next 24 hours.  Patient denies headache, no fever/chills/night sweats, no nausea/vomiting/diarrhea, no chest pain, no shortness of breath, no palpitations.  No acute concerns overnight per  nursing staff.  Objective: Vitals:   10/03/21 0417 10/03/21 0700 10/03/21 1100 10/03/21 1500  BP: 104/68 110/67 125/72 122/72  Pulse: 81 77 78 82  Resp: (!) 22 20 17 18   Temp: 98.6 F (37 C) 98.4 F (36.9 C) 97.8 F (36.6 C) 98.5 F (36.9 C)  TempSrc: Oral Oral Oral Oral  SpO2: 98% 95% 96% 98%  Weight:      Height:        Intake/Output Summary (Last 24 hours) at 10/03/2021 1708 Last data filed at 10/03/2021 1658 Gross per 24 hour  Intake 3221.2 ml  Output 900 ml  Net 2321.2 ml   Filed Weights   10/02/21 0458  Weight: 42.8 kg    Examination:  General exam: Appears calm and comfortable, elderly/thin in appearance Respiratory system: Clear to auscultation. Respiratory effort normal.  On room air Cardiovascular system: S1 & S2 heard, RRR. No JVD, murmurs, rubs, gallops or clicks. No pedal edema. Gastrointestinal system: Abdomen is distended, soft with mild tenderness greatest on right side. No organomegaly or masses felt.  Bowel sounds absent. Central nervous system: Alert and oriented. No focal neurological deficits. Extremities: Symmetric 5 x 5 power. Skin: No rashes, lesions or ulcers Psychiatry: Judgement and insight appear normal. Mood & affect appropriate.     Data Reviewed: I have personally reviewed following labs and imaging studies  CBC: Recent Labs  Lab 10/01/21 1434 10/02/21 0633  WBC 9.0 6.7  HGB 12.3 12.2  HCT 38.1 36.8  MCV 91.8 91.3  PLT 229 564   Basic Metabolic Panel: Recent Labs  Lab 10/01/21 1434 10/02/21 0633  NA 135 135  K 4.4 3.9  CL 99 101  CO2 25 23  GLUCOSE 99 80  BUN 29* 32*  CREATININE 1.32* 1.21*  CALCIUM 9.2 8.4*  MG  --  1.5*  PHOS  --  4.0   GFR: Estimated Creatinine Clearance (by C-G formula based on SCr of 1.21 mg/dL (H)) Female: 18.8 mL/min (A) Female: 22.1 mL/min (A) Liver Function Tests: Recent Labs  Lab 10/01/21 1434  AST 25  ALT 14  ALKPHOS 30*  BILITOT 1.0  PROT 7.0  ALBUMIN 3.9   Recent Labs   Lab 10/01/21 1434  LIPASE 34   No results for input(s): AMMONIA in the last 168 hours. Coagulation Profile: No results for input(s): INR, PROTIME in the last 168 hours. Cardiac Enzymes: No results for input(s): CKTOTAL, CKMB, CKMBINDEX, TROPONINI in the last 168 hours. BNP (last 3 results) No results for input(s): PROBNP in the last 8760 hours. HbA1C: No results for input(s): HGBA1C in the last 72 hours. CBG: No results for input(s): GLUCAP in the last 168 hours. Lipid Profile: No results for input(s): CHOL, HDL, LDLCALC, TRIG, CHOLHDL, LDLDIRECT in the last 72 hours. Thyroid Function Tests: No results for input(s): TSH, T4TOTAL, FREET4, T3FREE, THYROIDAB in the last 72 hours. Anemia Panel: No results for input(s): VITAMINB12, FOLATE, FERRITIN, TIBC, IRON, RETICCTPCT in the last 72 hours. Sepsis Labs: No results for input(s): PROCALCITON, LATICACIDVEN in the last 168 hours.  Recent Results (from the past 240 hour(s))  Resp Panel by RT-PCR (Flu A&B, Covid) Nasopharyngeal Swab     Status: None   Collection Time: 10/01/21  3:52 PM   Specimen: Nasopharyngeal Swab; Nasopharyngeal(NP) swabs in vial transport medium  Result Value Ref Range Status   SARS Coronavirus 2 by RT PCR NEGATIVE NEGATIVE Final    Comment: (NOTE) SARS-CoV-2 target nucleic acids are NOT DETECTED.  The SARS-CoV-2 RNA is generally detectable in upper respiratory specimens during the acute phase of infection. The lowest concentration of SARS-CoV-2 viral copies this assay can  detect is 138 copies/mL. A negative result does not preclude SARS-Cov-2 infection and should not be used as the sole basis for treatment or other patient management decisions. A negative result may occur with  improper specimen collection/handling, submission of specimen other than nasopharyngeal swab, presence of viral mutation(s) within the areas targeted by this assay, and inadequate number of viral copies(<138 copies/mL). A negative  result must be combined with clinical observations, patient history, and epidemiological information. The expected result is Negative.  Fact Sheet for Patients:  EntrepreneurPulse.com.au  Fact Sheet for Healthcare Providers:  IncredibleEmployment.be  This test is no t yet approved or cleared by the [place] FDA and  has been authorized for detection and/or diagnosis of SARS-CoV-2 by FDA under an Emergency Use Authorization (EUA). This EUA will remain  in effect (meaning this test can be used) for the duration of the COVID-19 declaration under Section 564(b)(1) of the Act, 21 U.S.C.section 360bbb-3(b)(1), unless the authorization is terminated  or revoked sooner.       Influenza A by PCR NEGATIVE NEGATIVE Final   Influenza B by PCR NEGATIVE NEGATIVE Final    Comment: (NOTE) The Xpert Xpress SARS-CoV-2/FLU/RSV plus assay is intended as an aid in the diagnosis of influenza from Nasopharyngeal swab specimens and should not be used as a sole basis for treatment. Nasal washings and aspirates are unacceptable for Xpert Xpress SARS-CoV-2/FLU/RSV testing.  Fact Sheet for Patients: EntrepreneurPulse.com.au  Fact Sheet for Healthcare Providers: IncredibleEmployment.be  This test is not yet approved or cleared by the [place] FDA and has been authorized for detection and/or diagnosis of SARS-CoV-2 by FDA under an Emergency Use Authorization (EUA). This EUA will remain in effect (meaning this test can be used) for the duration of the COVID-19 declaration under Section 564(b)(1) of the Act, 21 U.S.C. section 360bbb-3(b)(1), unless the authorization is terminated or revoked.  Performed at [facility], [address], [place], [place] [address]          Radiology Studies: DG Abdomen 1 View  Result Date: 10/01/2021 CLINICAL DATA:  NG tube placement EXAM: ABDOMEN - 1 VIEW COMPARISON:   Abdomen and pelvis CT, dated October 01, 2021 FINDINGS: Nasogastric tube is seen with its distal tip overlying the lateral aspect of the left upper quadrant. Versus likely within the body of the stomach. Multiple dilated small bowel loops are seen throughout the upper abdomen. Radiopaque surgical clips are seen within the right upper quadrant. No radio-opaque calculi or other significant radiographic abnormality are seen. IMPRESSION: 1. Nasogastric tube positioning, as described above. 2. Findings consistent with a small bowel obstruction. This correlates to findings noted on the prior abdomen pelvis CT. Electronically Signed   By: Virgina Norfolk M.D.   On: 10/01/2021 21:13   CT ABDOMEN PELVIS W CONTRAST  Result Date: 10/01/2021 CLINICAL DATA:  Abdomen pain and vomiting EXAM: CT ABDOMEN AND PELVIS WITH CONTRAST TECHNIQUE: Multidetector CT imaging of the abdomen and pelvis was performed using the standard protocol following bolus administration of intravenous contrast. CONTRAST:  50mL OMNIPAQUE IOHEXOL 350 MG/ML SOLN COMPARISON:  CT 04/06/2014 FINDINGS: Lower chest: Lung bases demonstrate no acute consolidation or effusion. Borderline cardiac size. Hepatobiliary: No focal liver abnormality is seen. Status post cholecystectomy. No biliary dilatation. Pancreas: Unremarkable. No pancreatic ductal dilatation or surrounding inflammatory changes. Spleen: Normal in size without focal abnormality. Adrenals/Urinary Tract: Adrenal glands are within normal limits. Cortical atrophy within the kidneys. Coarse calcification at the upper pole right kidney. Dilated left greater than right  renal pelvises without hydroureter or obstructing stone. The bladder is unremarkable. 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 Stomach/Bowel: The stomach is nonenlarged. Fluid-filled dilated mid small bowel measuring up to 3.3 cm. Relatively decompressed distal small bowel and colon, transition point not well-defined. No acute bowel  wall thickening. Negative appendix. Vascular/Lymphatic: Moderate aortic atherosclerosis. No aneurysm. No suspicious nodes. Reproductive: Status post hysterectomy. No adnexal masses. Other: No free air.  Small to moderate free fluid in the pelvis Musculoskeletal: Scoliosis and degenerative changes. No acute osseous abnormality. IMPRESSION: 1. Multiple dilated fluid-filled loops of small bowel with relatively decompressed distal small bowel and colon, findings are felt consistent with bowel obstruction though discrete transition point difficult to visualize. 2. Small amount of abdominopelvic ascites 3. Atrophic kidneys with probable scarring on the right. Electronically Signed   By: Donavan Foil M.D.   On: 10/01/2021 19:04   DG Abd 2 Views  Result Date: 10/03/2021 CLINICAL DATA:  Small bowel obstruction. EXAM: ABDOMEN - 2 VIEW COMPARISON:  October 02, 2021. FINDINGS: Nasogastric tube tip is seen in expected position of the stomach. Dilated small bowel loops are noted concerning for distal small bowel obstruction. No definite colonic dilatation is noted. Contrast is noted in the distal colon. IMPRESSION: Dilated small bowel loops are noted concerning for distal small bowel obstruction. Electronically Signed   By: Marijo Conception M.D.   On: 10/03/2021 13:57   DG Abd 2 Views  Result Date: 10/02/2021 CLINICAL DATA:  87 year old female with history of small-bowel obstruction. EXAM: ABDOMEN - 2 VIEW COMPARISON:  Abdominal radiograph 10/01/2021. FINDINGS: Nasogastric tube extends into the mid body of the stomach. Multiple dilated loops of gas-filled small bowel are noted measuring up to 4.3 cm in diameter. There is a small amount of colonic gas and stool. No pneumoperitoneum. Surgical clips project over the right upper quadrant of the abdomen, from prior cholecystectomy. Iodinated contrast material within the lumen of the urinary bladder. IMPRESSION: 1. Support apparatus and postoperative changes, as above. 2.  Persistent small bowel obstruction. Electronically Signed   By: Vinnie Langton M.D.   On: 10/02/2021 07:50   DG Abd Portable 1V-Small Bowel Obstruction Protocol-initial, 8 hr delay  Result Date: 10/02/2021 CLINICAL DATA:  Small bowel obstruction EXAM: PORTABLE ABDOMEN - 1 VIEW COMPARISON:  08/01/2021 FINDINGS: NG tube is in the stomach. Dilated gas and fluid/contrast filled small bowel loops are noted. Probable contrast in the right colon. No free air organomegaly. IMPRESSION: Small bowel obstruction pattern, unchanged. It appears that contrast has passed into the right colon. Electronically Signed   By: Rolm Baptise M.D.   On: 10/02/2021 18:30        Scheduled Meds:  enoxaparin (LOVENOX) injection  30 mg Subcutaneous Q24H   Continuous Infusions:  sodium chloride 75 mL/hr at 10/03/21 1658     LOS: 2 days    Time spent: 41 minutes spent on chart review, discussion with nursing staff, consultants, updating family and interview/physical exam; more than 50% of that time was spent in counseling and/or coordination of care.    Lyndell Gillyard J [place] DO [facility] Available via Epic secure chat 7am-7pm After these hours, please refer to coverage provider listed on [email or web address] 10/03/2021, 5:08 PM

## 2021-10-03 NOTE — Progress Notes (Signed)
Patient had abd pain and n/v through the night. Vomit was thick and clear looked to be more thick saliva. Oral suction connected in room. Meds ordered by MD through the night to treat pain and nausea. Patient was able to get some sleep in between doses of nausea meds.

## 2021-10-03 NOTE — Evaluation (Signed)
Occupational Therapy Evaluation Patient Details Name: Kathy Villanueva MRN: [id number] DOB: Oct 11, 1924 Today's Date: 10/03/2021   History of Present Illness presented to ER secondary to abdominal pain, vomiting; admitted for management of SBO (likely due to adhesions).  Initial recommendation for conservative measures; monitoring daily for status and need for surgical intervention.   Clinical Impression   Pt seen for OT evaluation this date in setting of acute hospitalization d/t SBO. Pt reports being able to walks with AD at baseline and having aide help 5 days/wk, however, somewhat difficulty to ascertain what I/ADLs aide assists and what pt is able to do herself. She is generally able to follow commands, but somewhat confused including stating she is in [facility] and appearing to have probable visual hallucination as evidenced to pointing in front of her and sating she sees a bug crawling although OT and CNA present are unable to observe anything of this nature in the room. Pt Requires MIN/MOD A for sup to sit and MOD A to get back to bed. She demos F sitting balance. She it able to perform most seated UB ADLs with SETUP to MIN A currently and requires MOD/MAX A for LB ADLs d/t decreased dynamic sitting balance, abdominal discomfort, and decreased strength. OT will continue to follow acutely. While she does have good home support, anticipate she will need more rehabilitation efforts to resume tolerance for walking and contributing to ADLs at her baseline to be appropriate/safe to return home. As a result, recommending STR in SNF setting.     Recommendations for follow up therapy are one component of a multi-disciplinary discharge planning process, led by the attending physician.  Recommendations may be updated based on patient status, additional functional criteria and insurance authorization.   Follow Up Recommendations  Skilled nursing-short term rehab (<3 hours/day)    Assistance Recommended  at Discharge    Functional Status Assessment  Patient has had a recent decline in their functional status and demonstrates the ability to make significant improvements in function in a reasonable and predictable amount of time.  Equipment Recommendations  Other (comment) (defer to next level of care)    Recommendations for Other Services       Precautions / Restrictions Precautions Precautions: Fall Precaution Comments: NPO/NGT Restrictions Weight Bearing Restrictions: No      Mobility Bed Mobility Overal bed mobility: Needs Assistance Bed Mobility: Supine to Sit;Sit to Supine     Supine to sit: Min assist;Mod assist Sit to supine: Mod assist        Transfers                   General transfer comment: deferred, fatigued      Balance Overall balance assessment: Needs assistance Sitting-balance support: No upper extremity supported;Feet supported Sitting balance-Leahy Scale: Fair                                     ADL either performed or assessed with clinical judgement   ADL                                         General ADL Comments: SETUP to MIN A for seated UB ADLs, MOD/MAX A for bed level LB ADLs, makes good effort, some difficulty task sequencing.     Vision   Additional  Comments: difficult to formally assess d/t cognition, she appears to track appropriately.     Perception     Praxis      Pertinent Vitals/Pain Pain Assessment: Faces Faces Pain Scale: Hurts little more Pain Location: abdomen Pain Descriptors / Indicators: Aching;Grimacing;Guarding Pain Intervention(s): Limited activity within patient's tolerance;Monitored during session     Hand Dominance     Extremity/Trunk Assessment Upper Extremity Assessment Upper Extremity Assessment: Generalized weakness (genererally atrophied in appearanec, MMT grossly 4-/5)   Lower Extremity Assessment Lower Extremity Assessment: Generalized weakness        Communication Communication Communication: HOH   Cognition Arousal/Alertness: Awake/alert Behavior During Therapy: WFL for tasks assessed/performed Overall Cognitive Status: Within Functional Limits for tasks assessed                                 General Comments: delayed processing and response time; minimal spontaneous interaction, some potential hallucinations?-referneces more than once that she sees something crawling, when asked for more information, states it's a bug and points in front of her to seemingly nothing.     General Comments       Exercises Other Exercises Other Exercises: OT ed re: role, pt with  moderate reception seemingly, but poor carryover when knowledge assessed later in session   Shoulder Instructions      Home Living Family/patient expects to be discharged to:: Private residence Living Arrangements: Children;Other relatives Available Help at Discharge: Family Type of Home: Apartment       Home Layout: One level               Home Equipment: Rollator (4 wheels)          Prior Functioning/Environment Prior Level of Function : Needs assist       Physical Assist : ADLs (physical)   ADLs (physical): Bathing;Dressing Mobility Comments: Sup/mod indep for household distances with 4WRW; 1 fall in previous six months.  Family assists with driving/community errands ADLs Comments: has aide 5 days/week, 2 hours/day        OT Problem List: Decreased strength;Decreased activity tolerance;Decreased safety awareness;Decreased knowledge of use of DME or AE;Pain      OT Treatment/Interventions: Self-care/ADL training;Therapeutic exercise;Therapeutic activities;DME and/or AE instruction;Patient/family education    OT Goals(Current goals can be found in the care plan section) Acute Rehab OT Goals Patient Stated Goal: none stated OT Goal Formulation: With patient Time For Goal Achievement: 10/17/21 Potential to Achieve Goals:  Fair ADL Goals Pt Will Perform Upper Body Dressing: with set-up;with supervision;sitting Pt Will Perform Lower Body Dressing: with min guard assist;sitting/lateral leans Pt Will Transfer to Toilet: with min assist;stand pivot transfer;bedside commode Pt Will Perform Toileting - Clothing Manipulation and hygiene: with min assist;sitting/lateral leans  OT Frequency: Min 1X/week   Barriers to D/C:            Co-evaluation              AM-PAC OT "6 Clicks" Daily Activity     Outcome Measure Help from another person eating meals?: A Little Help from another person taking care of personal grooming?: A Little Help from another person toileting, which includes using toliet, bedpan, or urinal?: A Lot Help from another person bathing (including washing, rinsing, drying)?: A Lot Help from another person to put on and taking off regular upper body clothing?: A Little Help from another person to put on and taking off regular lower body clothing?: A  Lot 6 Click Score: 15   End of Session    Activity Tolerance: Patient tolerated treatment well Patient left: in bed;with call bell/phone within reach;with bed alarm set (CNA presenting to take VS)  OT Visit Diagnosis: Muscle weakness (generalized) (M62.81);Pain Pain - part of body:  (abdomen)                Time: 0122-2411 OT Time Calculation (min): 15 min Charges:  OT General Charges $OT Visit: 1 Visit OT Evaluation $OT Eval Moderate Complexity: [place], MS, OTR/L ascom [phone] 10/03/21, 6:04 PM

## 2021-10-03 NOTE — Progress Notes (Signed)
Patient ID: Kathy Villanueva, adult   DOB: [birth year], 87 y.o.   MRN: [id number]  Surgery Follow up Note  Patient was evaluated this afternoon.  There has been no change of symptoms.  There is no nausea or vomiting.  Patient not passing gas or having bowel movement either.  No change on her pain.  Vitals:   10/03/21 0700 10/03/21 1100  BP: 110/67 125/72  Pulse: 77 78  Resp: 20 17  Temp: 98.4 F (36.9 C) 97.8 F (36.6 C)  SpO2: 95% 96%   General: alert, cooperative Abdomen: Mildly distended, mild tender  Assessment and plan: Patient continue without improvement of small bowel obstruction.  New abdominal x-ray shows persistent dilation of the small bowel.  No significant contrast appreciated may be because his diluted.  I contacted [name] Rone, who is patient's daughter assigned as contact family member.  I discussed with her that so far the small bowel obstruction is not resolving with conservative management.  I will give patient 24 more hours with nasogastric tube decompression, but if no improvement will have to consider surgical management.  The patient reports she was in a discussed with family members.  I will contact her tomorrow again to let her know about patient clinical status.  This follow-up encounter was more than 30-minute most of the time counseling the patient's family and coordinating plan of care.  Herbert Pun, MD, FACS

## 2021-10-03 NOTE — Progress Notes (Signed)
Initial Nutrition Assessment  DOCUMENTATION CODES:   Severe malnutrition in context of social or environmental circumstances  INTERVENTION:   RD will monitor for diet advancement vs the need for nutrition support   Pt at high refeed risk  NUTRITION DIAGNOSIS:   Severe Malnutrition related to social / environmental circumstances as evidenced by severe fat depletion, severe muscle depletion.  GOAL:   Patient will meet greater than or equal to 90% of their needs  MONITOR:   Diet advancement, Labs, Weight trends, Skin, I & O's  REASON FOR ASSESSMENT:   Other (Comment) (low BMI)    ASSESSMENT:   87 y/o female with h/o GERD, HTN, CKD 3b, B12 deficiency, gout and COVID 19 (may 2022) who is admitted with SBO.  Met with pt in room today. Pt unable to provide nutrition related history. Suspect pt with poor oral intake at baseline as pt with severe malnutrition. Pt currently NPO. NGT in place with 759m output. RD will monitor for diet advancement vs the for nutrition support. Pt is at high refeed risk. There is no documented weight history in chart to determine if any significant weight changes.   Medications reviewed and include: lovenox, NaCl @75ml /hr  Labs reviewed: K 3.9 wnl, BUN 32(H), creat 1.21(H), P 4.0 wnl, Mg 1.5(L)  NUTRITION - FOCUSED PHYSICAL EXAM:  Flowsheet Row Most Recent Value  Orbital Region Severe depletion  Upper Arm Region Severe depletion  Thoracic and Lumbar Region Severe depletion  Buccal Region Severe depletion  Temple Region Severe depletion  Clavicle Bone Region Severe depletion  Clavicle and Acromion Bone Region Severe depletion  Scapular Bone Region Severe depletion  Dorsal Hand Severe depletion  Patellar Region Severe depletion  Anterior Thigh Region Severe depletion  Posterior Calf Region Severe depletion  Edema (RD Assessment) None  Hair Reviewed  Eyes Reviewed  Mouth Reviewed  Skin Reviewed  Nails Reviewed   Diet Order:   Diet Order              Diet NPO time specified  Diet effective now                  EDUCATION NEEDS:   No education needs have been identified at this time  Skin:  Skin Assessment: Reviewed RN Assessment  Last BM:  10/25  Height:   Ht Readings from Last 1 Encounters:  10/02/21 5' 4"  (1.626 m)    Weight:   Wt Readings from Last 1 Encounters:  10/02/21 42.8 kg    Ideal Body Weight:  54.5 kg  BMI:  Body mass index is 16.21 kg/m.  Estimated Nutritional Needs:   Kcal:  1300-1500kcal/day  Protein:  65-75g/day  Fluid:  1.1-1.3L/day  CKoleen DistanceMS, RD, LDN Please refer to [facility] Ltdfor RD and/or RD on-call/weekend/after hours pager

## 2021-10-03 NOTE — Evaluation (Signed)
Physical Therapy Evaluation Patient Details Name: Jaquitta Dupriest MRN: [id number] DOB: 1924/09/28 Today's Date: 10/03/2021  History of Present Illness  presented to ER secondary to abdominal pain, vomiting; admitted for management of SBO (likely due to adhesions).  Initial recommendation for conservative measures; monitoring daily for status and need for surgical intervention.  Clinical Impression  Patient resting in bed upon arrival to session; daughter in law at bedside.  Alert and oriented to basic information; follows commands, though requires increased time for processing and initiation.  Endorses persistent abdominal pain, FACES 4-6/10; minimally changed with mobility efforts.  Generally weak and deconditioned throughout all extremities; no focal weakness appreciated.  Poor balance in sitting/standing postures, requiring physical assist to maintain safety at all times. Currently requiring min/mod assist for bed mobility; mod assist for sit/stand, basic transfer(bed/chair) with RW.  Frequent posterior lean/LOB requiring hands-on assist to correct and prevent LOB.  Fatigues quickly with exertional activity.  Additional gait distance deferred due to fatigue, continuous suction NGT.  Will continue to assess/progress as appropriate in subsequent treatment sessions. Would benefit from skilled PT to address above deficits and promote optimal return to PLOF.; .str      Recommendations for follow up therapy are one component of a multi-disciplinary discharge planning process, led by the attending physician.  Recommendations may be updated based on patient status, additional functional criteria and insurance authorization.  Follow Up Recommendations Skilled nursing-short term rehab (<3 hours/day)    Assistance Recommended at Discharge Frequent or constant Supervision/Assistance  Functional Status Assessment Patient has had a recent decline in their functional status and demonstrates the ability to make  significant improvements in function in a reasonable and predictable amount of time.  Equipment Recommendations       Recommendations for Other Services       Precautions / Restrictions Precautions Precautions: Fall Precaution Comments: NPO/NGT Restrictions Weight Bearing Restrictions: No      Mobility  Bed Mobility Overal bed mobility: Needs Assistance Bed Mobility: Supine to Sit     Supine to sit: Min assist;Mod assist          Transfers Overall transfer level: Needs assistance Equipment used: Rolling walker (2 wheels) Transfers: Sit to/from [employer] Transfers Sit to Stand: Mod assist Stand pivot transfers: Mod assist         General transfer comment: posterior lean/weight shift, absent balance and righting reactions; very short, shuffling steps, mod assist to guide/negotiate RW    Ambulation/Gait             General Gait Details: unsafe/unable due to NGT to continuous suction  Stairs            Wheelchair Mobility    Modified Rankin (Stroke Patients Only)       Balance Overall balance assessment: Needs assistance Sitting-balance support: No upper extremity supported;Feet supported Sitting balance-Leahy Scale: Fair     Standing balance support: Bilateral upper extremity supported Standing balance-Leahy Scale: Poor                               Pertinent Vitals/Pain Pain Assessment: Faces Faces Pain Scale: Hurts little more Breathing: normal Negative Vocalization: none Facial Expression: smiling or inexpressive Body Language: relaxed Consolability: no need to console PAINAD Score: 0 Pain Location: abdomen Pain Descriptors / Indicators: Aching;Grimacing;Guarding Pain Intervention(s): Limited activity within patient's tolerance;Monitored during session;Repositioned    Home Living Family/patient expects to be discharged to:: Private residence Living Arrangements: Children;Other relatives  Available Help at  Discharge: Family Type of Home: Apartment         Home Layout: One level Home Equipment: Rollator (4 wheels)      Prior Function Prior Level of Function : Needs assist       Physical Assist : ADLs (physical)   ADLs (physical): Bathing;Dressing Mobility Comments: Sup/mod indep for household distances with 4WRW; 1 fall in previous six months.  Family assists with driving/community errands ADLs Comments: has aide 5 days/week, 2 hours/day     Hand Dominance        Extremity/Trunk Assessment   Upper Extremity Assessment Upper Extremity Assessment: Generalized weakness (grossly 4-/5 throughout)    Lower Extremity Assessment Lower Extremity Assessment: Generalized weakness (grossly 4-/5 throughout)       Communication   Communication: HOH  Cognition Arousal/Alertness: Awake/alert Behavior During Therapy: WFL for tasks assessed/performed Overall Cognitive Status: Within Functional Limits for tasks assessed                                 General Comments: delayed processing and response time; minimal spontaneous interaction        General Comments      Exercises Other Exercises Other Exercises: Educated in role of PT and progressive mobility, safety and mechanics with transfers.  Patietn/daughter in law voiced understanding.   Assessment/Plan    PT Assessment Patient needs continued PT services  PT Problem List Decreased strength;Decreased range of motion;Decreased activity tolerance;Decreased balance;Decreased mobility;Decreased knowledge of use of DME;Decreased safety awareness;Decreased knowledge of precautions;Cardiopulmonary status limiting activity;Pain       PT Treatment Interventions DME instruction;Gait training;Functional mobility training;Therapeutic activities;Therapeutic exercise;Balance training;Patient/family education    PT Goals (Current goals can be found in the Care Plan section)  Acute Rehab PT Goals Patient Stated Goal: per  daughter in law, to maintain strength as able PT Goal Formulation: With patient/family Time For Goal Achievement: 10/17/21 Potential to Achieve Goals: Good    Frequency Min 2X/week   Barriers to discharge        Co-evaluation               AM-PAC PT "6 Clicks" Mobility  Outcome Measure Help needed turning from your back to your side while in a flat bed without using bedrails?: None Help needed moving from lying on your back to sitting on the side of a flat bed without using bedrails?: A Lot Help needed moving to and from a bed to a chair (including a wheelchair)?: A Lot Help needed standing up from a chair using your arms (e.g., wheelchair or bedside chair)?: A Lot Help needed to walk in hospital room?: A Lot Help needed climbing 3-5 steps with a railing? : A Lot 6 Click Score: 14    End of Session Equipment Utilized During Treatment: Gait belt Activity Tolerance: Patient limited by fatigue;Patient limited by pain Patient left: in chair;with family/visitor present;with chair alarm set Nurse Communication: Mobility status PT Visit Diagnosis: Muscle weakness (generalized) (M62.81);Difficulty in walking, not elsewhere classified (R26.2);Pain    Time: 0258-5277 PT Time Calculation (min) (ACUTE ONLY): 19 min   Charges:   PT Evaluation $PT Eval Moderate Complexity: 1 Mod PT Treatments $Therapeutic Activity: 8-22 mins       Tesneem Dufrane H. Owens Shark, PT, DPT, NCS 10/03/21, 10:29 AM [phone]

## 2021-10-03 NOTE — Progress Notes (Signed)
Patient ID: Kathy Villanueva, adult   DOB: [birth year], 87 y.o.   MRN: [id number]     [facility] Day(s): 2.   Interval History: Patient seen and examined, no acute events or new complaints overnight. Patient reports not passing gas or stool. Endorses mild abdominal pain. No nausea or vomiting.   Vital signs in last 24 hours: [min-max] current  Temp:  [97.8 F (36.6 C)-98.8 F (37.1 C)] 98.4 F (36.9 C) (10/26 0700) Pulse Rate:  [70-81] 77 (10/26 0700) Resp:  [18-22] 20 (10/26 0700) BP: (104-149)/(67-86) 110/67 (10/26 0700) SpO2:  [92 %-98 %] 95 % (10/26 0700)     Height: 5\' 4"  (162.6 cm) Weight: 42.8 kg BMI (Calculated): 16.2   Physical Exam:  Constitutional: alert, cooperative and no distress  Respiratory: breathing non-labored at rest  Cardiovascular: regular rate and sinus rhythm  Gastrointestinal: soft, non-tender, and mild-distended  Labs:  CBC Latest Ref Rng & Units 10/02/2021 10/01/2021 06/27/2014  WBC 4.0 - 10.5 K/uL 6.7 9.0 6.9  Hemoglobin 12.0 - 15.0 g/dL 12.2 12.3 10.9(L)  Hematocrit 36.0 - 46.0 % 36.8 38.1 34.2(L)  Platelets 150 - 400 K/uL 205 229 218   CMP Latest Ref Rng & Units 10/02/2021 10/01/2021 06/27/2014  Glucose 70 - 99 mg/dL 80 99 126(H)  BUN 8 - 23 mg/dL 32(H) 29(H) 26(H)  Creatinine 0.44 - 1.00 mg/dL 1.21(H) 1.32(H) 1.73(H)  Sodium 135 - 145 mmol/L 135 135 136  Potassium 3.5 - 5.1 mmol/L 3.9 4.4 3.7  Chloride 98 - 111 mmol/L 101 99 103  CO2 22 - 32 mmol/L 23 25 23   Calcium 8.9 - 10.3 mg/dL 8.4(L) 9.2 8.6  Total Protein 6.5 - 8.1 g/dL - 7.0 -  Total Bilirubin 0.3 - 1.2 mg/dL - 1.0 -  Alkaline Phos 38 - 126 U/L - 30(L) -  AST 15 - 41 U/L - 25 -  ALT 0 - 44 U/L - 14 -    Imaging studies: Abdominal xray with small bowel dilation. Contrast seems to be in ascending colon.   Assessment/Plan:  87 y.o. adult with small bowel obstruction, complicated by pertinent comorbidities including hypertension, dyslipidemia, gout and GERD.  No  clinical deterioration. Continue with stable vital signs, no fever. Will repeat xray today and see how contrast is moving. If it continue to advance will continue with conservative management. Appreciated Hospitalist management of medical comorbidity and keeping electrolytes within normal limits.   Arnold Long, MD

## 2021-10-04 ENCOUNTER — Encounter: Payer: Self-pay | Admitting: Family Medicine

## 2021-10-04 ENCOUNTER — Encounter
Admission: EM | Disposition: A | Discharge status: Home Health | Payer: Self-pay | Source: Home / Self Care | Attending: Internal Medicine

## 2021-10-04 ENCOUNTER — Inpatient Hospital Stay: Payer: Medicare HMO

## 2021-10-04 ENCOUNTER — Inpatient Hospital Stay: Payer: Medicare HMO | Admitting: Registered Nurse

## 2021-10-04 DIAGNOSIS — K56609 Unspecified intestinal obstruction, unspecified as to partial versus complete obstruction: Secondary | ICD-10-CM | POA: Diagnosis not present

## 2021-10-04 DIAGNOSIS — E7800 Pure hypercholesterolemia, unspecified: Secondary | ICD-10-CM | POA: Diagnosis not present

## 2021-10-04 DIAGNOSIS — E43 Unspecified severe protein-calorie malnutrition: Secondary | ICD-10-CM | POA: Diagnosis not present

## 2021-10-04 HISTORY — PX: LAPAROTOMY: SHX154

## 2021-10-04 HISTORY — PX: LYSIS OF ADHESION: SHX5961

## 2021-10-04 LAB — MAGNESIUM: Magnesium: 2.3 mg/dL (ref 1.7–2.4)

## 2021-10-04 LAB — BASIC METABOLIC PANEL
Anion gap: 15 (ref 5–15)
BUN: 42 mg/dL — ABNORMAL HIGH (ref 8–23)
CO2: 20 mmol/L — ABNORMAL LOW (ref 22–32)
Calcium: 8.4 mg/dL — ABNORMAL LOW (ref 8.9–10.3)
Chloride: 107 mmol/L (ref 98–111)
Creatinine, Ser: 1.63 mg/dL — ABNORMAL HIGH (ref 0.44–1.00)
GFR, Estimated: 29 mL/min — ABNORMAL LOW (ref >60)
Glucose, Bld: 70 mg/dL (ref 70–99)
Potassium: 3.6 mmol/L (ref 3.5–5.1)
Sodium: 142 mmol/L (ref 135–145)

## 2021-10-04 SURGERY — LAPAROTOMY, EXPLORATORY
Anesthesia: General

## 2021-10-04 MED ORDER — PROPOFOL 10 MG/ML IV BOLUS
INTRAVENOUS | Status: AC
  Filled 2021-10-04: qty 20

## 2021-10-04 MED ORDER — SUGAMMADEX SODIUM 200 MG/2ML IV SOLN
INTRAVENOUS | Status: DC | PRN
Start: 2021-10-04 — End: 2021-10-04
  Administered 2021-10-04: 200 mg via INTRAVENOUS

## 2021-10-04 MED ORDER — PROPOFOL 10 MG/ML IV BOLUS
INTRAVENOUS | Status: DC | PRN
  Administered 2021-10-04: 60 mg via INTRAVENOUS

## 2021-10-04 MED ORDER — PHENYLEPHRINE HCL (PRESSORS) 10 MG/ML IV SOLN
INTRAVENOUS | Status: DC | PRN
  Administered 2021-10-04: 200 ug via INTRAVENOUS
  Administered 2021-10-04 (×6): 100 ug via INTRAVENOUS

## 2021-10-04 MED ORDER — DEXAMETHASONE SODIUM PHOSPHATE 10 MG/ML IJ SOLN
INTRAMUSCULAR | Status: DC | PRN
  Administered 2021-10-04: 10 mg via INTRAVENOUS

## 2021-10-04 MED ORDER — FENTANYL CITRATE (PF) 100 MCG/2ML IJ SOLN: 25.0000 ug | INTRAMUSCULAR | Status: DC | PRN

## 2021-10-04 MED ORDER — INSULIN ASPART 100 UNIT/ML IJ SOLN: 0.0000 [IU] | Freq: Four times a day (QID) | INTRAMUSCULAR | Status: DC

## 2021-10-04 MED ORDER — SUCCINYLCHOLINE CHLORIDE 200 MG/10ML IV SOSY
PREFILLED_SYRINGE | INTRAVENOUS | Status: DC | PRN
  Administered 2021-10-04: 80 mg via INTRAVENOUS

## 2021-10-04 MED ORDER — TRACE MINERALS CU-MN-SE-ZN 300-55-60-3000 MCG/ML IV SOLN
INTRAVENOUS | Status: AC
  Filled 2021-10-04: qty 248

## 2021-10-04 MED ORDER — SEVOFLURANE IN SOLN
RESPIRATORY_TRACT | Status: AC
  Filled 2021-10-04: qty 250

## 2021-10-04 MED ORDER — SODIUM CHLORIDE 0.9 % IV SOLN: INTRAVENOUS | Status: DC

## 2021-10-04 MED ORDER — CEFAZOLIN (ANCEF) 1 G IV SOLR
1.0000 g | INTRAVENOUS | Status: AC
  Filled 2021-10-04: qty 1

## 2021-10-04 MED ORDER — FENTANYL CITRATE (PF) 100 MCG/2ML IJ SOLN
INTRAMUSCULAR | Status: DC | PRN
  Administered 2021-10-04 (×4): 25 ug via INTRAVENOUS

## 2021-10-04 MED ORDER — CEFAZOLIN SODIUM-DEXTROSE 1-4 GM/50ML-% IV SOLN
INTRAVENOUS | Status: DC | PRN
  Administered 2021-10-04: 1 g via INTRAVENOUS

## 2021-10-04 MED ORDER — ROCURONIUM BROMIDE 100 MG/10ML IV SOLN
INTRAVENOUS | Status: DC | PRN
  Administered 2021-10-04: 30 mg via INTRAVENOUS

## 2021-10-04 MED ORDER — ONDANSETRON HCL 4 MG/2ML IJ SOLN
INTRAMUSCULAR | Status: DC | PRN
  Administered 2021-10-04: 4 mg via INTRAVENOUS

## 2021-10-04 MED ORDER — BUPIVACAINE-EPINEPHRINE 0.5% -1:200000 IJ SOLN
INTRAMUSCULAR | Status: DC | PRN
  Administered 2021-10-04: 50 mL via INTRAMUSCULAR

## 2021-10-04 MED ORDER — 0.9 % SODIUM CHLORIDE (POUR BTL) OPTIME
TOPICAL | Status: DC | PRN
  Administered 2021-10-04: 200 mL

## 2021-10-04 MED ORDER — BUPIVACAINE-EPINEPHRINE (PF) 0.5% -1:200000 IJ SOLN
INTRAMUSCULAR | Status: AC
  Filled 2021-10-04: qty 30

## 2021-10-04 MED ORDER — FENTANYL CITRATE (PF) 100 MCG/2ML IJ SOLN
INTRAMUSCULAR | Status: AC
  Filled 2021-10-04: qty 2

## 2021-10-04 MED ORDER — LIDOCAINE HCL (CARDIAC) PF 100 MG/5ML IV SOSY
PREFILLED_SYRINGE | INTRAVENOUS | Status: DC | PRN
  Administered 2021-10-04: 60 mg via INTRAVENOUS

## 2021-10-04 MED ORDER — KETAMINE HCL 10 MG/ML IJ SOLN
INTRAMUSCULAR | Status: DC | PRN
  Administered 2021-10-04: 20 mg via INTRAVENOUS

## 2021-10-04 MED ORDER — BUPIVACAINE LIPOSOME 1.3 % IJ SUSP
INTRAMUSCULAR | Status: AC
  Filled 2021-10-04: qty 20

## 2021-10-04 MED ORDER — EPHEDRINE SULFATE 50 MG/ML IJ SOLN
INTRAMUSCULAR | Status: DC | PRN
  Administered 2021-10-04: 10 mg via INTRAVENOUS

## 2021-10-04 MED ORDER — KETAMINE HCL 50 MG/5ML IJ SOSY
PREFILLED_SYRINGE | INTRAMUSCULAR | Status: AC
  Filled 2021-10-04: qty 5

## 2021-10-04 SURGICAL SUPPLY — 28 items
CHLORAPREP W/TINT 26 (MISCELLANEOUS) ×2 IMPLANT
DRAPE LAPAROTOMY 100X77 ABD (DRAPES) ×2 IMPLANT
DRSG OPSITE POSTOP 4X8 (GAUZE/BANDAGES/DRESSINGS) ×2 IMPLANT
ELECT REM PT RETURN 9FT ADLT (ELECTROSURGICAL) ×2
ELECTRODE REM PT RTRN 9FT ADLT (ELECTROSURGICAL) ×1 IMPLANT
GLOVE SURG ENC MOIS LTX SZ6.5 (GLOVE) ×2 IMPLANT
GLOVE SURG UNDER POLY LF SZ6.5 (GLOVE) ×2 IMPLANT
GOWN STRL REUS W/ TWL LRG LVL3 (GOWN DISPOSABLE) ×2 IMPLANT
GOWN STRL REUS W/TWL LRG LVL3 (GOWN DISPOSABLE) ×2
KIT TURNOVER KIT A (KITS) ×2 IMPLANT
MANIFOLD NEPTUNE II (INSTRUMENTS) ×2 IMPLANT
NEEDLE HYPO 22GX1.5 SAFETY (NEEDLE) ×2 IMPLANT
NS IRRIG 1000ML POUR BTL (IV SOLUTION) ×2 IMPLANT
PACK BASIN MAJOR ARMC (MISCELLANEOUS) ×2 IMPLANT
SPONGE T-LAP 18X18 ~~LOC~~+RFID (SPONGE) ×4 IMPLANT
SUT PDS AB 1 TP1 54 (SUTURE) IMPLANT
SUT PDS PLUS 0 (SUTURE) ×2
SUT PDS PLUS AB 0 CT-2 (SUTURE) ×2 IMPLANT
SUT SILK 2 0 (SUTURE) ×1
SUT SILK 2-0 18XBRD TIE 12 (SUTURE) ×1 IMPLANT
SUT SILK 3 0 (SUTURE) ×1
SUT SILK 3-0 18XBRD TIE 12 (SUTURE) ×1 IMPLANT
SUT VIC AB 3-0 SH 27 (SUTURE) ×2
SUT VIC AB 3-0 SH 27X BRD (SUTURE) ×2 IMPLANT
SYR 50ML LL SCALE MARK (SYRINGE) ×2 IMPLANT
SYR BULB IRRIG 60ML STRL (SYRINGE) ×2 IMPLANT
TRAY FOLEY MTR SLVR 16FR STAT (SET/KITS/TRAYS/PACK) ×2 IMPLANT
WATER STERILE IRR 500ML POUR (IV SOLUTION) ×2 IMPLANT

## 2021-10-04 NOTE — Progress Notes (Signed)
Chaplain Maggie offered support to pt's daughter on the bench between [place] and [address]. She recently lost her husband and is wrestling with grief. The reality of her mom's surgery is stressful on her. Chaplain offered compassionate presence, empathy, and prayer. Family appreciated the visit.

## 2021-10-04 NOTE — Plan of Care (Signed)

## 2021-10-04 NOTE — Progress Notes (Signed)
Patient ID: Kathy Villanueva, adult   DOB: Feb 15, 1924, 87 y.o.   MRN: [id number]     [facility] Hospital Day(s): 3.   Interval History: Patient seen and examined, no acute events or new complaints overnight. Patient reports " feeling okay I guess".  He does endorse having some abdominal pain.  As per nurse he complained about nausea and nausea medication was given.  NGT 750 mL in 24 hours  Vital signs in last 24 hours: [min-max] current  Temp:  [97.8 F (36.6 C)-99.1 F (37.3 C)] 98.3 F (36.8 C) (10/27 0502) Pulse Rate:  [66-82] 66 (10/27 0502) Resp:  [16-18] 16 (10/27 0502) BP: (104-137)/(70-82) 137/70 (10/27 0502) SpO2:  [93 %-100 %] 100 % (10/27 0502)     Height: 5\' 4"  (162.6 cm) Weight: 42.8 kg BMI (Calculated): 16.2   Physical Exam:  Constitutional: alert, cooperative and no distress  Respiratory: breathing non-labored at rest  Cardiovascular: regular rate and sinus rhythm  Gastrointestinal: soft, mild-tender, and mild-distended  Labs:  CBC Latest Ref Rng & Units 10/02/2021 10/01/2021 06/27/2014  WBC 4.0 - 10.5 K/uL 6.7 9.0 6.9  Hemoglobin 12.0 - 15.0 g/dL 12.2 12.3 10.9(L)  Hematocrit 36.0 - 46.0 % 36.8 38.1 34.2(L)  Platelets 150 - 400 K/uL 205 229 218   CMP Latest Ref Rng & Units 10/02/2021 10/01/2021 06/27/2014  Glucose 70 - 99 mg/dL 80 99 126(H)  BUN 8 - 23 mg/dL 32(H) 29(H) 26(H)  Creatinine 0.44 - 1.00 mg/dL 1.21(H) 1.32(H) 1.73(H)  Sodium 135 - 145 mmol/L 135 135 136  Potassium 3.5 - 5.1 mmol/L 3.9 4.4 3.7  Chloride 98 - 111 mmol/L 101 99 103  CO2 22 - 32 mmol/L 23 25 23   Calcium 8.9 - 10.3 mg/dL 8.4(L) 9.2 8.6  Total Protein 6.5 - 8.1 g/dL - 7.0 -  Total Bilirubin 0.3 - 1.2 mg/dL - 1.0 -  Alkaline Phos 38 - 126 U/L - 30(L) -  AST 15 - 41 U/L - 25 -  ALT 0 - 44 U/L - 14 -    Imaging studies: New abdominal x-ray today shows persistent small bowel dilation without improvement.   Assessment/Plan:  87 y.o. adult with small bowel obstruction,  complicated by pertinent comorbidities including hypertension, dyslipidemia, gout and GERD.  I discussed with daughter again today that there has been no improvement of small bowel obstruction.  Patient passing gas no bowel movement.  X-ray with persistent small bowel dilation.  I discussed that at this point the chances of resolving spontaneously are low.  I recommend to proceed with exploratory laparotomy for resolution of small bowel obstruction.  Patient's daughter Kathy Villanueva will contact her family to make a decision.  Arnold Long, MD

## 2021-10-04 NOTE — Progress Notes (Signed)
PT Cancellation Note  Patient Details Name: Kathy Villanueva MRN: [id number] DOB: 1924-07-11   Cancelled Treatment:    Reason Eval/Treat Not Completed: Patient declined, no reason specified  Entered patient room and pt politely declining any out of bed mobility or supine exercises. Pt plans to go for surgery later this afternoon. Will re-attempt at a later time.  [name], SPT   [name] 10/04/2021, 11:10 AM

## 2021-10-04 NOTE — Anesthesia Procedure Notes (Signed)
Procedure Name: Intubation Date/Time: 10/04/2021 4:51 PM Performed by: Lily Peer, Starasia Sinko, CRNA Pre-anesthesia Checklist: Patient identified, Emergency Drugs available, Suction available and Patient being monitored Patient Re-evaluated:Patient Re-evaluated prior to induction Oxygen Delivery Method: Circle system utilized Preoxygenation: Pre-oxygenation with 100% oxygen Induction Type: IV induction Ventilation: Mask ventilation without difficulty Laryngoscope Size: McGraph and 3 Grade View: Grade I Tube type: Oral Tube size: 6.5 mm Number of attempts: 1 Airway Equipment and Method: Stylet Placement Confirmation: ETT inserted through vocal cords under direct vision, positive ETCO2 and breath sounds checked- equal and bilateral Secured at: 20 cm Tube secured with: Tape Dental Injury: Teeth and Oropharynx as per pre-operative assessment

## 2021-10-04 NOTE — Op Note (Signed)
Preoperative diagnosis: Small bowel obstruction.  Postoperative diagnosis: Small bowel obstruction .  Procedure: Exploratory laparotomy with enterolysis.  Anesthesia: GETA  Surgeon: Dr. Windell Moment, MD  Wound Classification: Clean   Indications: Patient is a 87 y.o. adult with history of hysterectomy, who presented 4 days ago with picture consistent with small bowel obstruction that progressed to complete bowel obstruction unresponsive to conservative measures.   Findings: Adhesion from small bowel to pelvis causing small bowel obstruction.  Adequate hemostasis  Description of procedure: The patient was placed in the supine position and general endotracheal anesthesia was induced. Preoperative antibiotics were given. The abdomen was prepped and draped in the usual sterile fashion. A timeout was completed verifying correct patient, procedure, site, positioning, and implant(s) and/or special equipment prior to beginning this procedure.  A vertical midline incision was made. This was deepened through the subcutaneous tissues and hemostasis was achieved with electrocautery. The linea alba was identified and incised and the peritoneal cavity entered with care.  Upon entering the abdominal cavity, there are extensive adhesions of omentum and bowel more prominent in pelvis. Omentum and loops of bowel were carefully dissected from the underside of the abdominal wall using sharp dissection with Metzenbaum scissors. Careful exploration of the abdomen was then performed.  The small bowel was run from the ligament of Treitz to the ileocecal valve. The bowel was noted to be dilated proximally and collapsed distally with a zone of transition at ileum. Multiple adhesions were noted to be obstructing the distal small bowel. All adhesions were divided sharply and all small bowel loops carefully separated. The entire small bowel was then inspected for viability and the absence of any additional obstructing  bands. Small bowel content was milked proximally to be aspirated by the nasogastric tube and distally into the colon, again confirming patency and integrity throughout its entire length. The abdominal cavity was copiously washed with saline and hemostasis was obtained.  The fascia was closed with a running suture of PDS 0.  The skin was closed with skin staples.  The patient tolerated the procedure well and was taken to the postanesthesia care unit in stable condition.   Specimen: None  Complications: None  Estimated Blood Loss: 10 mL

## 2021-10-04 NOTE — Anesthesia Postprocedure Evaluation (Addendum)
Anesthesia Post Note  Patient: Kathy Villanueva  Procedure(s) Performed: EXPLORATORY LAPAROTOMY LYSIS OF ADHESION  Patient location during evaluation: PACU Anesthesia Type: General Level of consciousness: awake (PreOp baseline) Pain management: pain level controlled Vital Signs Assessment: post-procedure vital signs reviewed and stable Respiratory status: spontaneous breathing, nonlabored ventilation, respiratory function stable and patient connected to nasal cannula oxygen Cardiovascular status: blood pressure returned to baseline and stable Postop Assessment: no apparent nausea or vomiting Anesthetic complications: no   No notable events documented.   Last Vitals:  Vitals:   10/04/21 1815 10/04/21 1830  BP: 140/83 (!) 145/86  Pulse: 85 91  Resp: 18 16  Temp:    SpO2: 94% 92%    Last Pain:  Vitals:   10/04/21 1642  TempSrc: Temporal  PainSc: 0-No pain                 Precious Haws Kemya Shed

## 2021-10-04 NOTE — Progress Notes (Signed)
Nutrition Follow Up Note   DOCUMENTATION CODES:   Severe malnutrition in context of social or environmental circumstances  INTERVENTION:   TPN per pharmacy   Recommend thiamine 170m daily added to TPN x 3 days   Pt at high refeed risk; recommend monitor potassium, magnesium and phosphorus labs daily until stable  Daily weights   NUTRITION DIAGNOSIS:   Severe Malnutrition related to social / environmental circumstances as evidenced by severe fat depletion, severe muscle depletion.  GOAL:   Patient will meet greater than or equal to 90% of their needs -not met   MONITOR:   Diet advancement, Labs, Weight trends, Skin, I & O's, TPN  ASSESSMENT:   87y/o female with h/o GERD, HTN, CKD 3b, B12 deficiency, gout and COVID 19 (may 2022) who is admitted with SBO.  Pt with continued small bowel obstruction. KUB reporting persistent partial distal small-bowel obstruction. NGT in place with 7588moutput. Plan is for exploratory laparotomy today. Will plan for PICC line and TPN to maximize pt's nutrition and to support post op healing. Pt is at high refeed risk.     Medications reviewed and include: lovenox, NaCl _0 /hr  Labs reviewed: K 3.6 wnl, BUN 42(H), creat 1.63(H), Mg 2.3 wnl  Diet Order:   Diet Order             Diet NPO time specified  Diet effective now                  EDUCATION NEEDS:   No education needs have been identified at this time  Skin:  Skin Assessment: Reviewed RN Assessment  Last BM:  10/25  Height:   Ht Readings from Last 1 Encounters:  10/02/21 _1  (1.626 m)    Weight:   Wt Readings from Last 1 Encounters:  10/02/21 42.8 kg    Ideal Body Weight:  54.5 kg  BMI:  Body mass index is 16.21 kg/m.  Estimated Nutritional Needs:   Kcal:  1300-1500kcal/day  Protein:  65-75g/day  Fluid:  1.1-1.3L/day  CaKoleen DistanceS, RD, LDN Please refer to [facility] and/or RD on-call/weekend/after hours pager

## 2021-10-04 NOTE — Anesthesia Preprocedure Evaluation (Addendum)
Anesthesia Evaluation  Patient identified by MRN, date of birth, ID band Patient confused    Reviewed: Allergy & Precautions, NPO status , Patient's Chart, lab work & pertinent test results  History of Anesthesia Complications Negative for: history of anesthetic complications  Airway Mallampati: III  TM Distance: >3 FB Neck ROM: full    Dental  (+) Chipped, Poor Dentition, Missing   Pulmonary neg pulmonary ROS, neg shortness of breath,    Pulmonary exam normal        Cardiovascular hypertension, (-) angina(-) Past MI Normal cardiovascular exam     Neuro/Psych negative neurological ROS  negative psych ROS   GI/Hepatic Neg liver ROS, GERD  ,  Endo/Other  negative endocrine ROS  Renal/GU CRF and ARFRenal disease     Musculoskeletal  (+) Arthritis ,   Abdominal   Peds  Hematology negative hematology ROS (+)   Anesthesia Other Findings Past Medical History: No date: Arthritis     Comment:  hand No date: GERD (gastroesophageal reflux disease) No date: Gout     Comment:  hand No date: Gross hematuria No date: Hematoma of kidney No date: History of kidney stones No date: HTN (hypertension)     Comment:  controlled on meds No date: Hydronephrosis No date: Hypercholesteremia No date: Kidney stones No date: Nausea No date: Transfusion history No date: Ureteral stone  Past Surgical History: No date: ABDOMINAL HYSTERECTOMY 10/20/2019: CATARACT EXTRACTION W/PHACO; Left     Comment:  Procedure: CATARACT EXTRACTION PHACO AND INTRAOCULAR               LENS PLACEMENT ([facility]) LEFT MALYUGIN 01:25.9,  13.6%,               11.71;  Surgeon: Leandrew Koyanagi, MD;  Location:               [facility];  Service: Ophthalmology;                Laterality: Left; No date: COLONOSCOPY No date: KIDNEY STONE SURGERY  BMI    Body Mass Index: 16.21 kg/m      Reproductive/Obstetrics negative OB ROS                             Anesthesia Physical Anesthesia Plan  ASA: 3  Anesthesia Plan: General ETT, Rapid Sequence and Cricoid Pressure   Post-op Pain Management:    Induction: Intravenous  PONV Risk Score and Plan: Ondansetron, Dexamethasone, Midazolam and Treatment may vary due to age or medical condition  Airway Management Planned: Oral ETT  Additional Equipment:   Intra-op Plan:   Post-operative Plan: Extubation in OR and Possible Post-op intubation/ventilation  Informed Consent: I have reviewed the patients History and Physical, chart, labs and discussed the procedure including the risks, benefits and alternatives for the proposed anesthesia with the patient or authorized representative who has indicated his/her understanding and acceptance.     Dental Advisory Given  Plan Discussed with: Anesthesiologist, CRNA and Surgeon  Anesthesia Plan Comments: (Patient and daughter Elvina Mattes [phone] )consented for risks of anesthesia including but not limited to:  - adverse reactions to medications - damage to eyes, teeth, lips or other oral mucosa - nerve damage due to positioning  - sore throat or hoarseness - Damage to heart, brain, nerves, lungs, other parts of body or loss of life  They voiced understanding.)       Anesthesia Quick Evaluation

## 2021-10-04 NOTE — Transfer of Care (Signed)
Immediate Anesthesia Transfer of Care Note  Patient: Kathy Villanueva  Procedure(s) Performed: EXPLORATORY LAPAROTOMY LYSIS OF ADHESION  Patient Location: PACU  Anesthesia Type:General  Level of Consciousness: drowsy  Airway & Oxygen Therapy: Patient Spontanous Breathing and Patient connected to face mask oxygen  Post-op Assessment: Report given to RN and Post -op Vital signs reviewed and stable  Post vital signs: Reviewed and stable  Last Vitals:  Vitals Value Taken Time  BP 145/75 10/04/21 1750  Temp    Pulse 75 10/04/21 1751  Resp 17 10/04/21 1753  SpO2 100 % 10/04/21 1751  Vitals shown include unvalidated device data.  Last Pain:  Vitals:   10/04/21 1642  TempSrc: Temporal  PainSc: 0-No pain      Patients Stated Pain Goal: 0 (37/[id number]/44 [id number])  Complications: No notable events documented.

## 2021-10-04 NOTE — Progress Notes (Signed)
PHARMACY - TOTAL PARENTERAL NUTRITION CONSULT NOTE   Indication: Small bowel obstruction  Patient Measurements: Height: 5\' 4"  (162.6 cm) Weight: 42.8 kg (94 lb 6.6 oz) IBW/kg (Calculated) : 54.7 TPN AdjBW (KG): 42.8 Body mass index is 16.21 kg/m.  Assessment: 87 y/o female with h/o GERD, HTN, CKD 3b, B12 deficiency, gout and COVID 19 (may 2022) who is admitted with SBO.  Glucose / Insulin: BG<100, stable, not on insulin/diabetic Electrolytes: stable at baseline and wnl Renal: SCr 1.32-->1.21-->1.63 Hepatic: LFTs wnl at baseline Intake / Output; MIVF: 0.9% saline at 75 mL/hr GI Imaging: 10/27 New abdominal x-ray today shows persistent small bowel dilation without improvement. GI Surgeries / Procedures: pending   Central access: pending TPN start date: 10/04/21 (pending)  Nutritional Goals: Goal TPN rate is 50 mL/hr (provides 74.4 g of protein and 1518.7 kcals per day)  RD Assessment: Estimated Needs Total Energy Estimated Needs: 1300-1500kcal/day Total Protein Estimated Needs: 65-75g/day Total Fluid Estimated Needs: 1.1-1.3L/day  Current Nutrition:  NPO  Plan:  Start TPN at 25 mL/hr at 1800 (700 mL total including overfill) Nutritional components Amino acids (using 15% Clinisol): 37.2 grams Dextrose: 113.4 grams Lipids (using 20% SMOFlipid): 22.6 grams Electrolytes in TPN (standard): Na 82mEq/L, K 54mEq/L, Ca 81mEq/L, Mg 34mEq/L, and Phos 46mmol/L. Cl:Ac 1:1 Add standard MVI, thiamine 100 mg and trace elements to TPN Initiate Sensitive q6h SSI and adjust as needed  Reduce MIVF to 50 mL/hr at 1800 Monitor TPN labs on Mon/Thurs, daily until stable  Dallie Piles 10/04/2021,11:12 AM

## 2021-10-04 NOTE — Progress Notes (Signed)
TPN ordered, PICC line was not placed. TPN not started. MD is aware.

## 2021-10-04 NOTE — Progress Notes (Addendum)
PROGRESS NOTE    [name]  GGY:[id number] DOB: 08-26-1924 DOA: 10/01/2021 PCP: Baxter Hire, MD    Brief Narrative:  Kathy Villanueva is a 87 year old female with past medical history significant for essential hypertension, dyslipidemia, gout, GERD, osteoarthritis who presented to [facility] ED on 10/24 with generalized abdominal pain.  Progressing over the last 4-5 days with associated nausea and vomiting.  Patient also with diminished bowel movements.  Denies fever/chills, no chest pain or palpitations, no diarrhea.  In the ED, BP 145/69, otherwise vital signs within normal limits.  Labs revealed a BUN of 29 with a creatinine of 1.32.  CBC unrevealing.  Influenza A/B PCR negative.  Covid-19 PCR negative.  CT abdomen/pelvis with multiple dilated fluid-filled loops of small bowel with relatively decompressed distal small bowel and colon consistent with bowel obstruction; although discrete transition point difficult to visualize.  NG tube was placed.  General surgery was consulted.  TRH consulted for further evaluation and management of small bowel obstruction.   Assessment & Plan:   Active Problems:   SBO (small bowel obstruction) (HCC)   Protein-calorie malnutrition, severe   Small bowel obstruction Patient presenting to the ED with abdominal pain, abdominal distention with associated nausea/vomiting and decreased bowel movements.  CT abdomen/pelvis notable for multiple dilated fluid-filled loops of small bowel with relatively decompressed distal small bowel and colon consistent with bowel obstruction; although discrete transition point difficult to visualize. --Repeat KUB today with continued small bowel obstruction --NPO; Continue NG tube to LWIS --IVF w/ NS at 38mL/hr --starting TPN --General surgery plans exploratory laparotomy pending family decision today  Hypomagnesemia: Resolved Magnesium 2.4 this morning. --Repeat electrolytes in a.m.  Essential hypertension At home on  amlodipine and hydrochlorothiazide. --Holding oral antihypertensives given n.p.o. and NG tube to suction --Hydralazine 10 mg IV q6h prn  CKD stage IIIb --Cr 1.32>1.21>1.63 --Continue IV fluids --BMP daily  Vitamin B12 deficiency --Continue to hold oral vitamin B12 supplement while NPO  HLD: Not currently on statin outpatient  Severe protein calorie malnutrition Body mass index is 16.21 kg/m. Nutrition Status: Nutrition Problem: Severe Malnutrition Etiology: social / environmental circumstances Signs/Symptoms: severe fat depletion, severe muscle depletion  --Dietitian following, currently n.p.o. with small bowel obstruction, will likely need further supplementation when able    DVT prophylaxis: Place and maintain sequential compression device Start: 10/04/21 1307 enoxaparin (LOVENOX) injection 30 mg Start: 10/02/21 0800   Code Status: Full Code Family Communication: Updated patient's sister-in-law present at bedside this morning  Disposition Plan:  Level of care: Med-Surg Status is: Inpatient  Remains inpatient appropriate because: Continued small bowel obstruction with NG tube to low wall suction, possible need of surgical intervention; likely plan for today     Consultants:  General surgery  Procedures:  NG tube placement  Antimicrobials:  None   Subjective: Patient seen examined bedside, resting comfortably.  No family present.  Continues with abdominal distention, abdominal pain and no bowel movement.  No significant change in bowel function with conservative measures/NG tube.  Seen by general surgeon this morning with likely plan for surgical invention pending family decision.  No other specific questions or concerns at this time.  Patient denies headache, no fever/chills/night sweats, no nausea/vomiting/diarrhea, no chest pain, no shortness of breath, no palpitations.  No acute concerns overnight per nursing staff.  Objective: Vitals:   10/03/21 1500 10/03/21  1933 10/04/21 0502 10/04/21 0842  BP: 122/72 104/82 137/70 124/65  Pulse: 82 71 66 63  Resp: 18 17 16 18   Temp:  98.5 F (36.9 C) 99.1 F (37.3 C) 98.3 F (36.8 C) 98.2 F (36.8 C)  TempSrc: Oral Oral Oral Oral  SpO2: 98% 93% 100% 93%  Weight:      Height:        Intake/Output Summary (Last 24 hours) at 10/04/2021 1320 Last data filed at 10/03/2021 1940 Gross per 24 hour  Intake 3287.13 ml  Output 750 ml  Net 2537.13 ml   Filed Weights   10/02/21 0458  Weight: 42.8 kg    Examination:  General exam: Appears calm and comfortable, elderly/cachectic in appearance with muscle loss/fat wasting appreciated Respiratory system: Clear to auscultation. Respiratory effort normal.  On room air Cardiovascular system: S1 & S2 heard, RRR. No JVD, murmurs, rubs, gallops or clicks. No pedal edema. Gastrointestinal system: Abdomen is distended, soft with mild tenderness greatest on right side. No organomegaly or masses felt.  Bowel sounds absent. Central nervous system: Alert and oriented. No focal neurological deficits. Extremities: Symmetric 5 x 5 power. Skin: No rashes, lesions or ulcers Psychiatry: Judgement and insight appear normal. Mood & affect appropriate.     Data Reviewed: I have personally reviewed following labs and imaging studies  CBC: Recent Labs  Lab 10/01/21 1434 10/02/21 0633  WBC 9.0 6.7  HGB 12.3 12.2  HCT 38.1 36.8  MCV 91.8 91.3  PLT 229 916   Basic Metabolic Panel: Recent Labs  Lab 10/01/21 1434 10/02/21 0633 10/04/21 0713  NA 135 135 142  K 4.4 3.9 3.6  CL 99 101 107  CO2 25 23 20*  GLUCOSE 99 80 70  BUN 29* 32* 42*  CREATININE 1.32* 1.21* 1.63*  CALCIUM 9.2 8.4* 8.4*  MG  --  1.5* 2.3  PHOS  --  4.0  --    GFR: Estimated Creatinine Clearance (by C-G formula based on SCr of 1.63 mg/dL (H)) Female: 13.9 mL/min (A) Female: 16.4 mL/min (A) Liver Function Tests: Recent Labs  Lab 10/01/21 1434  AST 25  ALT 14  ALKPHOS 30*  BILITOT 1.0   PROT 7.0  ALBUMIN 3.9   Recent Labs  Lab 10/01/21 1434  LIPASE 34   No results for input(s): AMMONIA in the last 168 hours. Coagulation Profile: No results for input(s): INR, PROTIME in the last 168 hours. Cardiac Enzymes: No results for input(s): CKTOTAL, CKMB, CKMBINDEX, TROPONINI in the last 168 hours. BNP (last 3 results) No results for input(s): PROBNP in the last 8760 hours. HbA1C: No results for input(s): HGBA1C in the last 72 hours. CBG: No results for input(s): GLUCAP in the last 168 hours. Lipid Profile: No results for input(s): CHOL, HDL, LDLCALC, TRIG, CHOLHDL, LDLDIRECT in the last 72 hours. Thyroid Function Tests: No results for input(s): TSH, T4TOTAL, FREET4, T3FREE, THYROIDAB in the last 72 hours. Anemia Panel: No results for input(s): VITAMINB12, FOLATE, FERRITIN, TIBC, IRON, RETICCTPCT in the last 72 hours. Sepsis Labs: No results for input(s): PROCALCITON, LATICACIDVEN in the last 168 hours.  Recent Results (from the past 240 hour(s))  Resp Panel by RT-PCR (Flu A&B, Covid) Nasopharyngeal Swab     Status: None   Collection Time: 10/01/21  3:52 PM   Specimen: Nasopharyngeal Swab; Nasopharyngeal(NP) swabs in vial transport medium  Result Value Ref Range Status   SARS Coronavirus 2 by RT PCR NEGATIVE NEGATIVE Final    Comment: (NOTE) SARS-CoV-2 target nucleic acids are NOT DETECTED.  The SARS-CoV-2 RNA is generally detectable in upper respiratory specimens during the acute phase of infection. The lowest concentration of SARS-CoV-2  viral copies this assay can detect is 138 copies/mL. A negative result does not preclude SARS-Cov-2 infection and should not be used as the sole basis for treatment or other patient management decisions. A negative result may occur with  improper specimen collection/handling, submission of specimen other than nasopharyngeal swab, presence of viral mutation(s) within the areas targeted by this assay, and inadequate number of  viral copies(<138 copies/mL). A negative result must be combined with clinical observations, patient history, and epidemiological information. The expected result is Negative.  Fact Sheet for Patients:  EntrepreneurPulse.com.au  Fact Sheet for Healthcare Providers:  IncredibleEmployment.be  This test is no t yet approved or cleared by the [place] FDA and  has been authorized for detection and/or diagnosis of SARS-CoV-2 by FDA under an Emergency Use Authorization (EUA). This EUA will remain  in effect (meaning this test can be used) for the duration of the COVID-19 declaration under Section 564(b)(1) of the Act, 21 U.S.C.section 360bbb-3(b)(1), unless the authorization is terminated  or revoked sooner.       Influenza A by PCR NEGATIVE NEGATIVE Final   Influenza B by PCR NEGATIVE NEGATIVE Final    Comment: (NOTE) The Xpert Xpress SARS-CoV-2/FLU/RSV plus assay is intended as an aid in the diagnosis of influenza from Nasopharyngeal swab specimens and should not be used as a sole basis for treatment. Nasal washings and aspirates are unacceptable for Xpert Xpress SARS-CoV-2/FLU/RSV testing.  Fact Sheet for Patients: EntrepreneurPulse.com.au  Fact Sheet for Healthcare Providers: IncredibleEmployment.be  This test is not yet approved or cleared by the [place] FDA and has been authorized for detection and/or diagnosis of SARS-CoV-2 by FDA under an Emergency Use Authorization (EUA). This EUA will remain in effect (meaning this test can be used) for the duration of the COVID-19 declaration under Section 564(b)(1) of the Act, 21 U.S.C. section 360bbb-3(b)(1), unless the authorization is terminated or revoked.  Performed at [facility], [address] [place], [place] [address]          Radiology Studies: DG Abd 2 Views  Result Date: 10/04/2021 CLINICAL DATA:  Small bowel  obstruction EXAM: ABDOMEN - 2 VIEW COMPARISON:  Earlier films of the same day FINDINGS: Gastric tube extends into the decompressed stomach as before. Visualized lungs are clear. Tortuous ectatic thoracic aorta noted. No free air. Multiple dilated small bowel loops largely stable in number and degree of dilatation. There is some distal oral contrast material in dilated small bowel loops and some muscle seen in the decompressed colon as before. Lumbar dextroscoliosis with multilevel spondylitic change. Cholecystectomy clips. IMPRESSION: Persistent partial distal small-bowel obstruction. Electronically Signed   By: Lucrezia Europe M.D.   On: 10/04/2021 10:39   DG Abd 2 Views  Result Date: 10/03/2021 CLINICAL DATA:  Small bowel obstruction. EXAM: ABDOMEN - 2 VIEW COMPARISON:  October 02, 2021. FINDINGS: Nasogastric tube tip is seen in expected position of the stomach. Dilated small bowel loops are noted concerning for distal small bowel obstruction. No definite colonic dilatation is noted. Contrast is noted in the distal colon. IMPRESSION: Dilated small bowel loops are noted concerning for distal small bowel obstruction. Electronically Signed   By: Marijo Conception M.D.   On: 10/03/2021 13:57   DG Abd Portable 1V-Small Bowel Obstruction Protocol-initial, 8 hr delay  Result Date: 10/02/2021 CLINICAL DATA:  Small bowel obstruction EXAM: PORTABLE ABDOMEN - 1 VIEW COMPARISON:  08/01/2021 FINDINGS: NG tube is in the stomach. Dilated gas and fluid/contrast filled small bowel loops are  noted. Probable contrast in the right colon. No free air organomegaly. IMPRESSION: Small bowel obstruction pattern, unchanged. It appears that contrast has passed into the right colon. Electronically Signed   By: Rolm Baptise M.D.   On: 10/02/2021 18:30   DG Abd Portable 2V  Result Date: 10/04/2021 CLINICAL DATA:  Small bowel obstruction. EXAM: PORTABLE ABDOMEN - 2 VIEW COMPARISON:  October 03, 2021. FINDINGS: Nasogastric tube tip is  seen in stomach. Small bowel dilatation is noted concerning for distal small bowel obstruction. No free air is noted. IMPRESSION: Stable small bowel dilatation is noted concerning for distal small bowel obstruction. Electronically Signed   By: Marijo Conception M.D.   On: 10/04/2021 07:58        Scheduled Meds:  ceFAZolin  1 g Other To OR   enoxaparin (LOVENOX) injection  30 mg Subcutaneous Q24H   [START ON 10/05/2021] insulin aspart  0-9 Units Subcutaneous Q6H   Continuous Infusions:  sodium chloride 75 mL/hr at 10/04/21 0736   sodium chloride     TPN ADULT (ION)       LOS: 3 days    Time spent: 38 minutes spent on chart review, discussion with nursing staff, consultants, updating family and interview/physical exam; more than 50% of that time was spent in counseling and/or coordination of care.    Ellena Kamen J [place] DO [facility] Available via Epic secure chat 7am-7pm After these hours, please refer to coverage provider listed on [email or web address] 10/04/2021, 1:20 PM

## 2021-10-05 ENCOUNTER — Encounter: Payer: Self-pay | Admitting: General Surgery

## 2021-10-05 ENCOUNTER — Inpatient Hospital Stay: Payer: Medicare HMO

## 2021-10-05 ENCOUNTER — Inpatient Hospital Stay: Payer: Self-pay

## 2021-10-05 DIAGNOSIS — E43 Unspecified severe protein-calorie malnutrition: Secondary | ICD-10-CM | POA: Diagnosis not present

## 2021-10-05 DIAGNOSIS — K56609 Unspecified intestinal obstruction, unspecified as to partial versus complete obstruction: Secondary | ICD-10-CM | POA: Diagnosis not present

## 2021-10-05 LAB — CBC
HCT: 37.1 % (ref 36.0–46.0)
Hemoglobin: 11.7 g/dL — ABNORMAL LOW (ref 12.0–15.0)
MCH: 29.7 pg (ref 26.0–34.0)
MCHC: 31.5 g/dL (ref 30.0–36.0)
MCV: 94.2 fL (ref 80.0–100.0)
Platelets: 190 10*3/uL (ref 150–400)
RBC: 3.94 MIL/uL (ref 3.87–5.11)
RDW: 14.6 % (ref 11.5–15.5)
WBC: 8 10*3/uL (ref 4.0–10.5)
nRBC: 0 % (ref 0.0–0.2)

## 2021-10-05 LAB — COMPREHENSIVE METABOLIC PANEL
ALT: 10 U/L (ref 0–44)
AST: 19 U/L (ref 15–41)
Albumin: 2.8 g/dL — ABNORMAL LOW (ref 3.5–5.0)
Alkaline Phosphatase: 28 U/L — ABNORMAL LOW (ref 38–126)
Anion gap: 11 (ref 5–15)
BUN: 40 mg/dL — ABNORMAL HIGH (ref 8–23)
CO2: 19 mmol/L — ABNORMAL LOW (ref 22–32)
Calcium: 7.8 mg/dL — ABNORMAL LOW (ref 8.9–10.3)
Chloride: 114 mmol/L — ABNORMAL HIGH (ref 98–111)
Creatinine, Ser: 1.4 mg/dL — ABNORMAL HIGH (ref 0.44–1.00)
GFR, Estimated: 35 mL/min — ABNORMAL LOW (ref >60)
Glucose, Bld: 97 mg/dL (ref 70–99)
Potassium: 3.7 mmol/L (ref 3.5–5.1)
Sodium: 144 mmol/L (ref 135–145)
Total Bilirubin: 1.1 mg/dL (ref 0.3–1.2)
Total Protein: 5.6 g/dL — ABNORMAL LOW (ref 6.5–8.1)

## 2021-10-05 LAB — PHOSPHORUS: Phosphorus: 4 mg/dL (ref 2.5–4.6)

## 2021-10-05 LAB — TRIGLYCERIDES: Triglycerides: 70 mg/dL (ref <150)

## 2021-10-05 LAB — GLUCOSE, CAPILLARY: Glucose-Capillary: 137 mg/dL — ABNORMAL HIGH (ref 70–99)

## 2021-10-05 LAB — MAGNESIUM: Magnesium: 1.9 mg/dL (ref 1.7–2.4)

## 2021-10-05 MED ORDER — SODIUM CHLORIDE 0.9% FLUSH: 10.0000 mL | INTRAVENOUS | Status: DC | PRN

## 2021-10-05 MED ORDER — SODIUM CHLORIDE 0.9% FLUSH
10.0000 mL | Freq: Two times a day (BID) | INTRAVENOUS | Status: DC
  Administered 2021-10-07 – 2021-10-13 (×13): 10 mL

## 2021-10-05 MED ORDER — CHLORHEXIDINE GLUCONATE CLOTH 2 % EX PADS
6.0000 | MEDICATED_PAD | Freq: Every day | CUTANEOUS | Status: DC
  Administered 2021-10-05 – 2021-10-13 (×9): 6 via TOPICAL

## 2021-10-05 MED ORDER — TRACE MINERALS CU-MN-SE-ZN 300-55-60-3000 MCG/ML IV SOLN
INTRAVENOUS | Status: AC
  Filled 2021-10-05: qty 248

## 2021-10-05 MED ORDER — INSULIN ASPART 100 UNIT/ML IJ SOLN
0.0000 [IU] | Freq: Four times a day (QID) | INTRAMUSCULAR | Status: DC
  Administered 2021-10-06 – 2021-10-07 (×3): 1 [IU] via SUBCUTANEOUS
  Filled 2021-10-05 (×4): qty 1

## 2021-10-05 NOTE — Progress Notes (Signed)
No order yet entered for PICC line placement. TPN has not been started. Blood glucose checks not initiated nor insulin due to patient not starting TPN as of yet. Will have day shift nurse follow up with providers as MD aware on day shift yesterday that patient not with PICC, but not yet placed orders.

## 2021-10-05 NOTE — Progress Notes (Signed)
Peripherally Inserted Central Catheter Placement  The IV Nurse has discussed with the patient and/or persons authorized to consent for the patient, the purpose of this procedure and the potential benefits and risks involved with this procedure.  The benefits include less needle sticks, lab draws from the catheter, and the patient may be discharged home with the catheter. Risks include, but not limited to, infection, bleeding, blood clot (thrombus formation), and puncture of an artery; nerve damage and irregular heartbeat and possibility to perform a PICC exchange if needed/ordered by physician.  Alternatives to this procedure were also discussed.  Bard Power PICC patient education guide, fact sheet on infection prevention and patient information card has been provided to patient /or left at bedside.    PICC Placement Documentation  PICC Double Lumen 63/78/58 PICC Left Basilic 39 cm 0 cm (Active)  Indication for Insertion or Continuance of Line Administration of hyperosmolar/irritating solutions (i.e. TPN, Vancomycin, etc.) 10/05/21 1622  Exposed Catheter (cm) 0 cm 10/05/21 1622  Site Assessment Clean;Dry;Intact 10/05/21 1622  Lumen #1 Status Flushed;Saline locked;Blood return noted 10/05/21 1622  Lumen #2 Status Flushed;Saline locked;Blood return noted 10/05/21 1622  Dressing Type Transparent 10/05/21 1622  Dressing Status Clean;Dry;Intact 10/05/21 1622  Antimicrobial disc in place? Yes 10/05/21 [place] Not Applicable 85/02/77 4128  Line Care Connections checked and tightened 10/05/21 1622  Line Adjustment (NICU/IV Team Only) No 10/05/21 1622  Dressing Intervention New dressing 10/05/21 1622  Dressing Change Due 10/12/21 10/05/21 [place], Nicolette Bang 10/05/2021, 4:23 PM

## 2021-10-05 NOTE — Progress Notes (Signed)
Patient ID: Kathy Villanueva, adult   DOB: 1924/09/02, 87 y.o.   MRN: [id number]     [place] [facility] Day(s): 4.   Interval History: Patient seen and examined, no acute events or new complaints overnight. Patient reports feeling okay.  Denies passing gas or bowel movement.  Endorses pain is controlled.  Vital signs in last 24 hours: [min-max] current  Temp:  [97.4 F (36.3 C)-98.8 F (37.1 C)] 97.4 F (36.3 C) (10/28 0728) Pulse Rate:  [72-95] 72 (10/28 0728) Resp:  [15-20] 17 (10/28 0728) BP: (116-147)/(64-86) 132/65 (10/28 0728) SpO2:  [91 %-100 %] 96 % (10/28 0728) Weight:  [42.7 kg-42.8 kg] 42.7 kg (10/28 0500)     Height: 5\' 4"  (162.6 cm) Weight: 42.7 kg BMI (Calculated): 16.15   Physical Exam:  Constitutional: alert, cooperative and no distress  Respiratory: breathing non-labored at rest  Cardiovascular: regular rate and sinus rhythm  Gastrointestinal: soft, non-tender, and non-distended  Labs:  CBC Latest Ref Rng & Units 10/05/2021 10/02/2021 10/01/2021  WBC 4.0 - 10.5 K/uL 8.0 6.7 9.0  Hemoglobin 12.0 - 15.0 g/dL 11.7(L) 12.2 12.3  Hematocrit 36.0 - 46.0 % 37.1 36.8 38.1  Platelets 150 - 400 K/uL 190 205 229   CMP Latest Ref Rng & Units 10/05/2021 10/04/2021 10/02/2021  Glucose 70 - 99 mg/dL 97 70 80  BUN 8 - 23 mg/dL 40(H) 42(H) 32(H)  Creatinine 0.44 - 1.00 mg/dL 1.40(H) 1.63(H) 1.21(H)  Sodium 135 - 145 mmol/L 144 142 135  Potassium 3.5 - 5.1 mmol/L 3.7 3.6 3.9  Chloride 98 - 111 mmol/L 114(H) 107 101  CO2 22 - 32 mmol/L 19(L) 20(L) 23  Calcium 8.9 - 10.3 mg/dL 7.8(L) 8.4(L) 8.4(L)  Total Protein 6.5 - 8.1 g/dL 5.6(L) - -  Total Bilirubin 0.3 - 1.2 mg/dL 1.1 - -  Alkaline Phos 38 - 126 U/L 28(L) - -  AST 15 - 41 U/L 19 - -  ALT 0 - 44 U/L 10 - -    Imaging studies: No new pertinent imaging studies   Assessment/Plan:  87 y.o. adult with small bowel obstruction 1 Day Post-Op s/p exploratory laparotomy and lysis of adhesion, complicated by  pertinent comorbidities including hypertension, dyslipidemia, gout and GERD.  Patient today stable with adequate vital signs.  No clinical deterioration.  Labs shows no leukocytosis and stable hemoglobin.  No significant electrolyte disturbance.  I expect postoperative ileus.  We will keep NGT in place until patient has adequate bowel function.  Agree with TPN.  Encourage the patient and the family to restart physical therapy.  I will continue to follow.  Arnold Long, MD

## 2021-10-05 NOTE — Progress Notes (Signed)
PROGRESS NOTE    Kathy Villanueva  BMW:[id number] DOB: [birth year] DOA: 10/01/2021 PCP: Baxter Hire, MD    Brief Narrative:  Kathy Villanueva is a 87 year old female with past medical history significant for essential hypertension, dyslipidemia, gout, GERD, osteoarthritis who presented to [facility] ED on 10/24 with generalized abdominal pain.  Progressing over the last 4-5 days with associated nausea and vomiting.  Patient also with diminished bowel movements.  Denies fever/chills, no chest pain or palpitations, no diarrhea.  In the ED, BP 145/69, otherwise vital signs within normal limits.  Labs revealed a BUN of 29 with a creatinine of 1.32.  CBC unrevealing.  Influenza A/B PCR negative.  Covid-19 PCR negative.  CT abdomen/pelvis with multiple dilated fluid-filled loops of small bowel with relatively decompressed distal small bowel and colon consistent with bowel obstruction; although discrete transition point difficult to visualize.  NG tube was placed.  General surgery was consulted.  TRH consulted for further evaluation and management of small bowel obstruction.   Assessment & Plan:   Active Problems:   SBO (small bowel obstruction) (HCC)   Protein-calorie malnutrition, severe   Small bowel obstruction Patient presenting to the ED with abdominal pain, abdominal distention with associated nausea/vomiting and decreased bowel movements.  CT abdomen/pelvis notable for multiple dilated fluid-filled loops of small bowel with relatively decompressed distal small bowel and colon consistent with bowel obstruction; although discrete transition point difficult to visualize.  Patient unfortunately failed conservative measures with NG tube and underwent exploratory laparotomy by general surgery on 10/04/2021 with enterolysis. --Starting TPN, PICC line ordered --Further per general surgery  Hypomagnesemia: Resolved Magnesium 1.9 this morning.  Essential hypertension At home on amlodipine and  hydrochlorothiazide. --Holding oral antihypertensives given n.p.o.  --BP 132/65 this am --Hydralazine 10 mg IV q6h prn  CKD stage IIIb --Cr 1.32>1.21>1.63>1.40 --Continue IV fluids, TPN starting  Vitamin B12 deficiency --Continue to hold oral vitamin B12 supplement while NPO  HLD: Not currently on statin outpatient  Severe protein calorie malnutrition Body mass index is 16.16 kg/m. Nutrition Status: Nutrition Problem: Severe Malnutrition Etiology: social / environmental circumstances Signs/Symptoms: severe fat depletion, severe muscle depletion  --Dietitian following, continues NPO following surgery; starting TPN    DVT prophylaxis: Place and maintain sequential compression device Start: 10/04/21 1307 enoxaparin (LOVENOX) injection 30 mg Start: 10/02/21 0800   Code Status: Full Code Family Communication: Updated patient's sister-in-law present at bedside this morning  Disposition Plan:  Level of care: Med-Surg Status is: Inpatient  Remains inpatient appropriate because: Continued small bowel obstruction s/p exploratory laparotomy with enterolysis, on TPN, remains n.p.o. will eventually need SNF placement when tolerating diet and off of TPN.     Consultants:  General surgery  Procedures:  NG tube placement Exploratory laparotomy with enterolysis, general surgery 10/27  Antimicrobials:  None   Subjective: Patient seen examined bedside, resting comfortably.  No family present.  Reports abdominal soreness.  Underwent exploratory laparotomy with lysis of adhesions yesterday.  Continues NPO.  Starting TPN today.  No other specific questions or concerns at this time.  Patient denies headache, no fever, no nausea/vomiting/diarrhea, no chest pain, no shortness of breath, no palpitations.  No acute concerns overnight per nursing staff.  Objective: Vitals:   10/04/21 1932 10/05/21 0127 10/05/21 0500 10/05/21 0728  BP: 130/75 116/64  132/65  Pulse: 95 80  72  Resp: 18 16   17   Temp: (!) 97.5 F (36.4 C) 97.9 F (36.6 C)  (!) 97.4 F (36.3 C)  TempSrc: Oral Oral  Oral  SpO2:  100%  96%  Weight:   42.7 kg   Height:        Intake/Output Summary (Last 24 hours) at 10/05/2021 1238 Last data filed at 10/05/2021 0542 Gross per 24 hour  Intake 1000 ml  Output 1235 ml  Net -235 ml   Filed Weights   10/02/21 0458 10/04/21 1642 10/05/21 0500  Weight: 42.8 kg 42.8 kg 42.7 kg    Examination:  General exam: Appears calm and comfortable, elderly/cachectic in appearance with muscle loss/fat wasting appreciated Respiratory system: Clear to auscultation. Respiratory effort normal.  On room air Cardiovascular system: S1 & S2 heard, RRR. No JVD, murmurs, rubs, gallops or clicks. No pedal edema. Gastrointestinal system: Abdomen is distended, soft with mild tenderness, central abdominal dressing/honeycomb noted with some blood on dressing,. No organomegaly or masses felt.  Bowel sounds absent. Central nervous system: Alert and oriented. No focal neurological deficits. Extremities: Symmetric 5 x 5 power. Skin: No rashes, lesions or ulcers Psychiatry: Judgement and insight appear normal. Mood & affect appropriate.     Data Reviewed: I have personally reviewed following labs and imaging studies  CBC: Recent Labs  Lab 10/01/21 1434 10/02/21 0633 10/05/21 0554  WBC 9.0 6.7 8.0  HGB 12.3 12.2 11.7*  HCT 38.1 36.8 37.1  MCV 91.8 91.3 94.2  PLT 229 205 846   Basic Metabolic Panel: Recent Labs  Lab 10/01/21 1434 10/02/21 0633 10/04/21 0713 10/05/21 0554  NA 135 135 142 144  K 4.4 3.9 3.6 3.7  CL 99 101 107 114*  CO2 25 23 20* 19*  GLUCOSE 99 80 70 97  BUN 29* 32* 42* 40*  CREATININE 1.32* 1.21* 1.63* 1.40*  CALCIUM 9.2 8.4* 8.4* 7.8*  MG  --  1.5* 2.3 1.9  PHOS  --  4.0  --  4.0   GFR: Estimated Creatinine Clearance (by C-G formula based on SCr of 1.4 mg/dL (H)) Female: 16.2 mL/min (A) Female: 19.1 mL/min (A) Liver Function Tests: Recent Labs   Lab 10/01/21 1434 10/05/21 0554  AST 25 19  ALT 14 10  ALKPHOS 30* 28*  BILITOT 1.0 1.1  PROT 7.0 5.6*  ALBUMIN 3.9 2.8*   Recent Labs  Lab 10/01/21 1434  LIPASE 34   No results for input(s): AMMONIA in the last 168 hours. Coagulation Profile: No results for input(s): INR, PROTIME in the last 168 hours. Cardiac Enzymes: No results for input(s): CKTOTAL, CKMB, CKMBINDEX, TROPONINI in the last 168 hours. BNP (last 3 results) No results for input(s): PROBNP in the last 8760 hours. HbA1C: No results for input(s): HGBA1C in the last 72 hours. CBG: No results for input(s): GLUCAP in the last 168 hours. Lipid Profile: Recent Labs    10/05/21 0554  TRIG 70   Thyroid Function Tests: No results for input(s): TSH, T4TOTAL, FREET4, T3FREE, THYROIDAB in the last 72 hours. Anemia Panel: No results for input(s): VITAMINB12, FOLATE, FERRITIN, TIBC, IRON, RETICCTPCT in the last 72 hours. Sepsis Labs: No results for input(s): PROCALCITON, LATICACIDVEN in the last 168 hours.  Recent Results (from the past 240 hour(s))  Resp Panel by RT-PCR (Flu A&B, Covid) Nasopharyngeal Swab     Status: None   Collection Time: 10/01/21  3:52 PM   Specimen: Nasopharyngeal Swab; Nasopharyngeal(NP) swabs in vial transport medium  Result Value Ref Range Status   SARS Coronavirus 2 by RT PCR NEGATIVE NEGATIVE Final    Comment: (NOTE) SARS-CoV-2 target nucleic acids are NOT DETECTED.  The SARS-CoV-2 RNA is  generally detectable in upper respiratory specimens during the acute phase of infection. The lowest concentration of SARS-CoV-2 viral copies this assay can detect is 138 copies/mL. A negative result does not preclude SARS-Cov-2 infection and should not be used as the sole basis for treatment or other patient management decisions. A negative result may occur with  improper specimen collection/handling, submission of specimen other than nasopharyngeal swab, presence of viral mutation(s) within  the areas targeted by this assay, and inadequate number of viral copies(<138 copies/mL). A negative result must be combined with clinical observations, patient history, and epidemiological information. The expected result is Negative.  Fact Sheet for Patients:  EntrepreneurPulse.com.au  Fact Sheet for Healthcare Providers:  IncredibleEmployment.be  This test is no t yet approved or cleared by the [place] FDA and  has been authorized for detection and/or diagnosis of SARS-CoV-2 by FDA under an Emergency Use Authorization (EUA). This EUA will remain  in effect (meaning this test can be used) for the duration of the COVID-19 declaration under Section 564(b)(1) of the Act, 21 U.S.C.section 360bbb-3(b)(1), unless the authorization is terminated  or revoked sooner.       Influenza A by PCR NEGATIVE NEGATIVE Final   Influenza B by PCR NEGATIVE NEGATIVE Final    Comment: (NOTE) The Xpert Xpress SARS-CoV-2/FLU/RSV plus assay is intended as an aid in the diagnosis of influenza from Nasopharyngeal swab specimens and should not be used as a sole basis for treatment. Nasal washings and aspirates are unacceptable for Xpert Xpress SARS-CoV-2/FLU/RSV testing.  Fact Sheet for Patients: EntrepreneurPulse.com.au  Fact Sheet for Healthcare Providers: IncredibleEmployment.be  This test is not yet approved or cleared by the [place] FDA and has been authorized for detection and/or diagnosis of SARS-CoV-2 by FDA under an Emergency Use Authorization (EUA). This EUA will remain in effect (meaning this test can be used) for the duration of the COVID-19 declaration under Section 564(b)(1) of the Act, 21 U.S.C. section 360bbb-3(b)(1), unless the authorization is terminated or revoked.  Performed at [facility], [address] [place], [place] [address]          Radiology Studies: DG Abd 2  Views  Result Date: 10/04/2021 CLINICAL DATA:  Small bowel obstruction EXAM: ABDOMEN - 2 VIEW COMPARISON:  Earlier films of the same day FINDINGS: Gastric tube extends into the decompressed stomach as before. Visualized lungs are clear. Tortuous ectatic thoracic aorta noted. No free air. Multiple dilated small bowel loops largely stable in number and degree of dilatation. There is some distal oral contrast material in dilated small bowel loops and some muscle seen in the decompressed colon as before. Lumbar dextroscoliosis with multilevel spondylitic change. Cholecystectomy clips. IMPRESSION: Persistent partial distal small-bowel obstruction. Electronically Signed   By: Lucrezia Europe M.D.   On: 10/04/2021 10:39   DG Abd Portable 2V  Result Date: 10/04/2021 CLINICAL DATA:  Small bowel obstruction. EXAM: PORTABLE ABDOMEN - 2 VIEW COMPARISON:  October 03, 2021. FINDINGS: Nasogastric tube tip is seen in stomach. Small bowel dilatation is noted concerning for distal small bowel obstruction. No free air is noted. IMPRESSION: Stable small bowel dilatation is noted concerning for distal small bowel obstruction. Electronically Signed   By: Marijo Conception M.D.   On: 10/04/2021 07:58   [place] EKG SITE RITE  Result Date: 10/05/2021 If Site Rite image not attached, placement could not be confirmed due to current cardiac rhythm.       Scheduled Meds:  ceFAZolin  1 g Other To OR  enoxaparin (LOVENOX) injection  30 mg Subcutaneous Q24H   [START ON 10/06/2021] insulin aspart  0-9 Units Subcutaneous Q6H   Continuous Infusions:  sodium chloride 50 mL/hr at 10/04/21 2350   TPN ADULT (ION)     TPN ADULT (ION)       LOS: 4 days    Time spent: 38 minutes spent on chart review, discussion with nursing staff, consultants, updating family and interview/physical exam; more than 50% of that time was spent in counseling and/or coordination of care.    Estephanie Hubbs J [place] DO [facility] Available via Epic  secure chat 7am-7pm After these hours, please refer to coverage provider listed on [email or web address] 10/05/2021, 12:38 PM

## 2021-10-05 NOTE — TOC Initial Note (Signed)
Transition of Care [facility]) - Initial/Assessment Note    Patient Details  Name: Kathy Villanueva MRN: [id number] Date of Birth: Jan 17, 1924  [facility] CM/SW Contact:    Candie Chroman, LCSW Phone Number: 10/05/2021, 12:04 PM  Clinical Narrative:  CSW met with patient. Daughter at bedside. CSW introduced role and explained that PT recommendations would be discussed. Patient was awake but did not participate in discussion. Made them aware of SNF recommendation. Encouraged them to discuss with other family. Gave CMS scores for facilities within 25 miles of her zip code. Daughter has worked in several of them. Will wait until NG and TPN discontinued/weaning off before sending out referral to avoid denials. No further concerns. CSW encouraged patient and her daughter to contact CSW as needed. CSW will continue to follow patient and her daughter for support and facilitate discharge once medically stable.                Expected Discharge Plan: Skilled Nursing Facility Barriers to Discharge: Continued Medical Work up   Patient Goals and CMS Choice   CMS Medicare.gov Compare Post Acute Care list provided to:: Patient Represenative (must comment) (Daughter at bedside)    Expected Discharge Plan and Services Expected Discharge Plan: [place] Acute Care Choice:  (TBD) Living arrangements for the past 2 months: Apartment                                      Prior Living Arrangements/Services Living arrangements for the past 2 months: Apartment Lives with:: Adult Children Patient language and need for interpreter reviewed:: Yes        Need for Family Participation in Patient Care: Yes (Comment) Care giver support system in place?: Yes (comment) Current home services: DME Criminal Activity/Legal Involvement Pertinent to Current Situation/Hospitalization: No - Comment as needed  Activities of Daily Living Home Assistive Devices/Equipment:  None ADL Screening (condition at time of admission) Patient's cognitive ability adequate to safely complete daily activities?: No Is the patient deaf or have difficulty hearing?: No Does the patient have difficulty seeing, even when wearing glasses/contacts?: No Does the patient have difficulty concentrating, remembering, or making decisions?: No Patient able to express need for assistance with ADLs?: No Does the patient have difficulty dressing or bathing?: Yes Independently performs ADLs?: No Communication: Independent Dressing (OT): Dependent Is this a change from baseline?: Pre-admission baseline Grooming: Dependent Is this a change from baseline?: Pre-admission baseline Feeding: Needs assistance Is this a change from baseline?: Pre-admission baseline Bathing: Needs assistance Is this a change from baseline?: Pre-admission baseline Toileting: Needs assistance Is this a change from baseline?: Pre-admission baseline In/Out Bed: Needs assistance Is this a change from baseline?: Pre-admission baseline Walks in Home: Needs assistance Is this a change from baseline?: Pre-admission baseline Does the patient have difficulty walking or climbing stairs?: Yes Weakness of Legs: Both Weakness of Arms/Hands: Both  Permission Sought/Granted            Permission granted to share info w Relationship: Daughter at bedside     Emotional Assessment Appearance:: Appears stated age Attitude/Demeanor/Rapport: Unable to Assess Affect (typically observed): Unable to Assess Orientation: : Oriented to Self, Oriented to Place, Oriented to  Time, Oriented to Situation Alcohol / Substance Use: Not Applicable Psych Involvement: No (comment)  Admission diagnosis:  Small bowel obstruction ([place]) [K56.609] SBO (small bowel obstruction) ([place]) [K56.609] Patient  Active Problem List   Diagnosis Date Noted   Protein-calorie malnutrition, severe 10/03/2021   SBO (small bowel obstruction) ([place]) 10/01/2021    Hematoma of kidney 11/12/2015   History of nephrolithiasis 11/12/2015   PCP:  Baxter Hire, MD Pharmacy:   [facility], [place], [place] [address] [place] [address] Phone: [phone] Fax: [phone]  [facility] [place], [place] [address] Phone: [phone] Fax: [phone]     Social Determinants of Health (SDOH) Interventions    Readmission Risk Interventions No flowsheet data found.

## 2021-10-05 NOTE — Progress Notes (Signed)
Ice chips ordered by Dr Windell Moment per patient request

## 2021-10-05 NOTE — Progress Notes (Signed)
Telephone consent obtained from daughter Elvina Mattes for PICC.

## 2021-10-05 NOTE — Progress Notes (Signed)
Danae Chen, RN notified that PICC is SVC and ready to use.  Instructed to use all new tubing when accessing PICC.

## 2021-10-05 NOTE — Progress Notes (Signed)
PHARMACY - TOTAL PARENTERAL NUTRITION CONSULT NOTE   Indication: Small bowel obstruction  Patient Measurements: Height: 5\' 4"  (162.6 cm) Weight: 42.7 kg (94 lb 2.2 oz) IBW/kg (Calculated) : 54.7 TPN AdjBW (KG): 42.8 Body mass index is 16.16 kg/m.  Assessment: 87 y/o female with h/o GERD, HTN, CKD 3b, B12 deficiency, gout and COVID 19 (may 2022) who is admitted with SBO.   Glucose / Insulin: BG<100, stable, not on insulin/diabetic Electrolytes: stable at baseline and wnl Renal: SCr 1.32-->1.40 Hepatic: LFT, TG wnl at baseline Intake / Output; MIVF: 0.9% saline at 50 mL/hr GI Imaging:  10/27 New abdominal x-ray today shows persistent small bowel dilation without improvement. GI Surgeries / Procedures:  10/27: Exploratory laparotomy with enterolysis  Central access: pending (PICC delayed) TPN start date: 10/05/21 (pending)  Nutritional Goals: Goal TPN rate is 50 mL/hr (provides 74.4 g of protein and 1518.7 kcals per day)  RD Assessment: Estimated Needs Total Energy Estimated Needs: 1300-1500kcal/day Total Protein Estimated Needs: 65-75g/day Total Fluid Estimated Needs: 1.1-1.3L/day  Current Nutrition:  NPO  Plan:  Start TPN at 25 mL/hr at 1800 (700 mL total including overfill) Nutritional components Amino acids (using 15% Clinisol): 37.2 grams Dextrose: 113.4 grams Lipids (using 20% SMOFlipid): 22.6 grams Electrolytes in TPN (standard): Na 62mEq/L, K 34mEq/L, Ca 88mEq/L, Mg 23mEq/L, and Phos 70mmol/L. Cl:Ac 1:1 Add standard MVI, thiamine 100 mg and trace elements to TPN Initiate Sensitive q6h SSI and adjust as needed  Reduce MIVF to 50 mL/hr at 1800 Monitor TPN labs on Mon/Thurs, daily until stable  Dallie Piles 10/05/2021,7:31 AM

## 2021-10-05 NOTE — Plan of Care (Signed)

## 2021-10-06 DIAGNOSIS — K56609 Unspecified intestinal obstruction, unspecified as to partial versus complete obstruction: Secondary | ICD-10-CM | POA: Diagnosis not present

## 2021-10-06 DIAGNOSIS — E43 Unspecified severe protein-calorie malnutrition: Secondary | ICD-10-CM | POA: Diagnosis not present

## 2021-10-06 LAB — RENAL FUNCTION PANEL
Albumin: 2.6 g/dL — ABNORMAL LOW (ref 3.5–5.0)
Anion gap: 5 (ref 5–15)
BUN: 34 mg/dL — ABNORMAL HIGH (ref 8–23)
CO2: 22 mmol/L (ref 22–32)
Calcium: 8.2 mg/dL — ABNORMAL LOW (ref 8.9–10.3)
Chloride: 117 mmol/L — ABNORMAL HIGH (ref 98–111)
Creatinine, Ser: 1.16 mg/dL — ABNORMAL HIGH (ref 0.44–1.00)
GFR, Estimated: 43 mL/min — ABNORMAL LOW (ref >60)
Glucose, Bld: 145 mg/dL — ABNORMAL HIGH (ref 70–99)
Phosphorus: 2 mg/dL — ABNORMAL LOW (ref 2.5–4.6)
Potassium: 3.7 mmol/L (ref 3.5–5.1)
Sodium: 144 mmol/L (ref 135–145)

## 2021-10-06 LAB — GLUCOSE, CAPILLARY
Glucose-Capillary: 132 mg/dL — ABNORMAL HIGH (ref 70–99)
Glucose-Capillary: 116 mg/dL — ABNORMAL HIGH (ref 70–99)
Glucose-Capillary: 120 mg/dL — ABNORMAL HIGH (ref 70–99)

## 2021-10-06 LAB — CBC
HCT: 34 % — ABNORMAL LOW (ref 36.0–46.0)
Hemoglobin: 11.1 g/dL — ABNORMAL LOW (ref 12.0–15.0)
MCH: 29.9 pg (ref 26.0–34.0)
MCHC: 32.6 g/dL (ref 30.0–36.0)
MCV: 91.6 fL (ref 80.0–100.0)
Platelets: 156 10*3/uL (ref 150–400)
RBC: 3.71 MIL/uL — ABNORMAL LOW (ref 3.87–5.11)
RDW: 14.8 % (ref 11.5–15.5)
WBC: 8.4 10*3/uL (ref 4.0–10.5)
nRBC: 0 % (ref 0.0–0.2)

## 2021-10-06 LAB — MAGNESIUM: Magnesium: 1.9 mg/dL (ref 1.7–2.4)

## 2021-10-06 MED ORDER — THIAMINE HCL 100 MG/ML IJ SOLN
INTRAMUSCULAR | Status: AC
  Filled 2021-10-06: qty 496

## 2021-10-06 MED ORDER — SODIUM CHLORIDE 0.9 % IV SOLN: INTRAVENOUS | Status: DC

## 2021-10-06 MED ORDER — POTASSIUM PHOSPHATES 15 MMOLE/5ML IV SOLN
15.0000 mmol | Freq: Once | INTRAVENOUS | Status: AC
  Administered 2021-10-06: 15 mmol via INTRAVENOUS
  Filled 2021-10-06: qty 5

## 2021-10-06 NOTE — Progress Notes (Signed)
PROGRESS NOTE    Kathy Villanueva  YYT:[id number] DOB: Jul 10, 1924 DOA: 10/01/2021 PCP: Baxter Hire, MD    Brief Narrative:  Kathy Villanueva is a 87 year old female with past medical history significant for essential hypertension, dyslipidemia, gout, GERD, osteoarthritis who presented to [facility] ED on 10/24 with generalized abdominal pain.  Progressing over the last 4-5 days with associated nausea and vomiting.  Patient also with diminished bowel movements.  Denies fever/chills, no chest pain or palpitations, no diarrhea.  In the ED, BP 145/69, otherwise vital signs within normal limits.  Labs revealed a BUN of 29 with a creatinine of 1.32.  CBC unrevealing.  Influenza A/B PCR negative.  Covid-19 PCR negative.  CT abdomen/pelvis with multiple dilated fluid-filled loops of small bowel with relatively decompressed distal small bowel and colon consistent with bowel obstruction; although discrete transition point difficult to visualize.  NG tube was placed.  General surgery was consulted.  TRH consulted for further evaluation and management of small bowel obstruction.   Assessment & Plan:   Active Problems:   SBO (small bowel obstruction) (HCC)   Protein-calorie malnutrition, severe   Small bowel obstruction Patient presenting to the ED with abdominal pain, abdominal distention with associated nausea/vomiting and decreased bowel movements.  CT abdomen/pelvis notable for multiple dilated fluid-filled loops of small bowel with relatively decompressed distal small bowel and colon consistent with bowel obstruction; although discrete transition point difficult to visualize.  Patient unfortunately failed conservative measures with NG tube and underwent exploratory laparotomy by general surgery on 10/04/2021 with enterolysis. --continue TPN, NGT to suction, NPO except Ice chips --Further per general surgery  Hypomagnesemia: Resolved Magnesium 1.9 this morning.  Essential hypertension At home on  amlodipine and hydrochlorothiazide. --Holding oral antihypertensives given n.p.o.  --Hydralazine 10 mg IV q6h prn  CKD stage IIIb --Cr 1.32>1.21>1.63>1.40>1.16 --Continue IV fluids, TPN starting  Vitamin B12 deficiency --Continue to hold oral vitamin B12 supplement while NPO  HLD: Not currently on statin outpatient  Severe protein calorie malnutrition Body mass index is 16.03 kg/m. Nutrition Status: Nutrition Problem: Severe Malnutrition Etiology: social / environmental circumstances Signs/Symptoms: severe fat depletion, severe muscle depletion  --Dietitian following, continues NPO following surgery; started on TPN    DVT prophylaxis: Place and maintain sequential compression device Start: 10/04/21 1307 enoxaparin (LOVENOX) injection 30 mg Start: 10/02/21 0800   Code Status: Full Code Family Communication: Updated patient's sister-in-law present at bedside this morning  Disposition Plan:  Level of care: Med-Surg Status is: Inpatient  Remains inpatient appropriate because: Continued small bowel obstruction s/p exploratory laparotomy with enterolysis, and now with postop ileus on TPN, remains n.p.o. will eventually need SNF placement when tolerating diet and off of TPN.     Consultants:  General surgery  Procedures:  NG tube placement Exploratory laparotomy with enterolysis, general surgery 10/27  Antimicrobials:  None   Subjective: Patient seen examined bedside, resting comfortably.  No family present.  Continues with abdominal soreness.  Denies flatus or bowel movement.  Continues with NG tube to suction.  Started on TPN yesterday.  Okay for ice chips per general surgery.  No other questions or concerns at this time.  Patient denies headache, no fever, no nausea/vomiting/diarrhea, no chest pain, no shortness of breath, no palpitations.  No acute concerns overnight per nursing staff.  Objective: Vitals:   10/05/21 2049 10/06/21 0307 10/06/21 0500 10/06/21 0804   BP: 136/66 (!) 166/80  (!) 159/79  Pulse: 71 71  70  Resp: 16 20  18   Temp: 98.2  F (36.8 C) 97.6 F (36.4 C)  97.8 F (36.6 C)  TempSrc: Oral Oral  Oral  SpO2: 94% 95%  100%  Weight:   42.4 kg   Height:        Intake/Output Summary (Last 24 hours) at 10/06/2021 1211 Last data filed at 10/06/2021 0109 Gross per 24 hour  Intake 1002.07 ml  Output 550 ml  Net 452.07 ml   Filed Weights   10/04/21 1642 10/05/21 0500 10/06/21 0500  Weight: 42.8 kg 42.7 kg 42.4 kg    Examination:  General exam: Appears calm and comfortable, elderly/cachectic in appearance with muscle loss/fat wasting appreciated Respiratory system: Clear to auscultation. Respiratory effort normal.  On room air Cardiovascular system: S1 & S2 heard, RRR. No JVD, murmurs, rubs, gallops or clicks. No pedal edema. Gastrointestinal system: Abdomen is distended, soft with mild tenderness, central abdominal dressing/honeycomb noted with some dried blood on dressing,. No organomegaly or masses felt.  Bowel sounds absent. Central nervous system: Alert and oriented. No focal neurological deficits. Extremities: Symmetric 5 x 5 power. Skin: No rashes, lesions or ulcers Psychiatry: Judgement and insight appear normal. Mood & affect appropriate.     Data Reviewed: I have personally reviewed following labs and imaging studies  CBC: Recent Labs  Lab 10/01/21 1434 10/02/21 0633 10/05/21 0554 10/06/21 0444  WBC 9.0 6.7 8.0 8.4  HGB 12.3 12.2 11.7* 11.1*  HCT 38.1 36.8 37.1 34.0*  MCV 91.8 91.3 94.2 91.6  PLT 229 205 190 323   Basic Metabolic Panel: Recent Labs  Lab 10/01/21 1434 10/02/21 0633 10/04/21 0713 10/05/21 0554 10/06/21 0444  NA 135 135 142 144 144  K 4.4 3.9 3.6 3.7 3.7  CL 99 101 107 114* 117*  CO2 25 23 20* 19* 22  GLUCOSE 99 80 70 97 145*  BUN 29* 32* 42* 40* 34*  CREATININE 1.32* 1.21* 1.63* 1.40* 1.16*  CALCIUM 9.2 8.4* 8.4* 7.8* 8.2*  MG  --  1.5* 2.3 1.9 1.9  PHOS  --  4.0  --  4.0 2.0*    GFR: Estimated Creatinine Clearance (by C-G formula based on SCr of 1.16 mg/dL (H)) Female: 19.4 mL/min (A) Female: 22.8 mL/min (A) Liver Function Tests: Recent Labs  Lab 10/01/21 1434 10/05/21 0554 10/06/21 0444  AST 25 19  --   ALT 14 10  --   ALKPHOS 30* 28*  --   BILITOT 1.0 1.1  --   PROT 7.0 5.6*  --   ALBUMIN 3.9 2.8* 2.6*   Recent Labs  Lab 10/01/21 1434  LIPASE 34   No results for input(s): AMMONIA in the last 168 hours. Coagulation Profile: No results for input(s): INR, PROTIME in the last 168 hours. Cardiac Enzymes: No results for input(s): CKTOTAL, CKMB, CKMBINDEX, TROPONINI in the last 168 hours. BNP (last 3 results) No results for input(s): PROBNP in the last 8760 hours. HbA1C: No results for input(s): HGBA1C in the last 72 hours. CBG: Recent Labs  Lab 10/05/21 2353 10/06/21 0517  GLUCAP 137* 132*   Lipid Profile: Recent Labs    10/05/21 0554  TRIG 70   Thyroid Function Tests: No results for input(s): TSH, T4TOTAL, FREET4, T3FREE, THYROIDAB in the last 72 hours. Anemia Panel: No results for input(s): VITAMINB12, FOLATE, FERRITIN, TIBC, IRON, RETICCTPCT in the last 72 hours. Sepsis Labs: No results for input(s): PROCALCITON, LATICACIDVEN in the last 168 hours.  Recent Results (from the past 240 hour(s))  Resp Panel by RT-PCR (Flu A&B, Covid) Nasopharyngeal Swab  Status: None   Collection Time: 10/01/21  3:52 PM   Specimen: Nasopharyngeal Swab; Nasopharyngeal(NP) swabs in vial transport medium  Result Value Ref Range Status   SARS Coronavirus 2 by RT PCR NEGATIVE NEGATIVE Final    Comment: (NOTE) SARS-CoV-2 target nucleic acids are NOT DETECTED.  The SARS-CoV-2 RNA is generally detectable in upper respiratory specimens during the acute phase of infection. The lowest concentration of SARS-CoV-2 viral copies this assay can detect is 138 copies/mL. A negative result does not preclude SARS-Cov-2 infection and should not be used as the sole  basis for treatment or other patient management decisions. A negative result may occur with  improper specimen collection/handling, submission of specimen other than nasopharyngeal swab, presence of viral mutation(s) within the areas targeted by this assay, and inadequate number of viral copies(<138 copies/mL). A negative result must be combined with clinical observations, patient history, and epidemiological information. The expected result is Negative.  Fact Sheet for Patients:  EntrepreneurPulse.com.au  Fact Sheet for Healthcare Providers:  IncredibleEmployment.be  This test is no t yet approved or cleared by the [place] FDA and  has been authorized for detection and/or diagnosis of SARS-CoV-2 by FDA under an Emergency Use Authorization (EUA). This EUA will remain  in effect (meaning this test can be used) for the duration of the COVID-19 declaration under Section 564(b)(1) of the Act, 21 U.S.C.section 360bbb-3(b)(1), unless the authorization is terminated  or revoked sooner.       Influenza A by PCR NEGATIVE NEGATIVE Final   Influenza B by PCR NEGATIVE NEGATIVE Final    Comment: (NOTE) The Xpert Xpress SARS-CoV-2/FLU/RSV plus assay is intended as an aid in the diagnosis of influenza from Nasopharyngeal swab specimens and should not be used as a sole basis for treatment. Nasal washings and aspirates are unacceptable for Xpert Xpress SARS-CoV-2/FLU/RSV testing.  Fact Sheet for Patients: EntrepreneurPulse.com.au  Fact Sheet for Healthcare Providers: IncredibleEmployment.be  This test is not yet approved or cleared by the [place] FDA and has been authorized for detection and/or diagnosis of SARS-CoV-2 by FDA under an Emergency Use Authorization (EUA). This EUA will remain in effect (meaning this test can be used) for the duration of the COVID-19 declaration under Section 564(b)(1) of the Act,  21 U.S.C. section 360bbb-3(b)(1), unless the authorization is terminated or revoked.  Performed at [facility], [address] [place], [place] [address]          Radiology Studies: DG Chest [place] 1 View  Result Date: 10/05/2021 CLINICAL DATA:  PICC line placement EXAM: PORTABLE CHEST 1 VIEW COMPARISON:  Portable exam 1629 hours compared to 06/27/2014 FINDINGS: Nasogastric tube extends into stomach. LEFT arm PICC line tip projects over SVC. Normal heart size, mediastinal contours, and pulmonary vascularity. Minimal bibasilar atelectasis and questionable tiny pleural effusions. Remaining lungs clear. No pneumothorax or acute osseous findings. IMPRESSION: Minimal bibasilar atelectasis and questionable tiny pleural effusions. Electronically Signed   By: Lavonia Dana M.D.   On: 10/05/2021 16:40   [place] EKG SITE RITE  Result Date: 10/05/2021 If Site Rite image not attached, placement could not be confirmed due to current cardiac rhythm.       Scheduled Meds:  Chlorhexidine Gluconate Cloth  6 each Topical Daily   enoxaparin (LOVENOX) injection  30 mg Subcutaneous Q24H   insulin aspart  0-9 Units Subcutaneous Q6H   sodium chloride flush  10-40 mL Intracatheter Q12H   Continuous Infusions:  sodium chloride 50 mL/hr at 10/05/21 1722   sodium  chloride     potassium PHOSPHATE IVPB (in mmol) 15 mmol (10/06/21 0934)   TPN ADULT (ION) 25 mL/hr at 10/05/21 1722   TPN ADULT (ION)       LOS: 5 days    Time spent: 38 minutes spent on chart review, discussion with nursing staff, consultants, updating family and interview/physical exam; more than 50% of that time was spent in counseling and/or coordination of care.    Vicie Cech J [place] DO [facility] Available via Epic secure chat 7am-7pm After these hours, please refer to coverage provider listed on [email or web address] 10/06/2021, 12:11 PM

## 2021-10-06 NOTE — Progress Notes (Signed)
PHARMACY - TOTAL PARENTERAL NUTRITION CONSULT NOTE   Indication: Small bowel obstruction  Patient Measurements: Height: 5\' 4"  (162.6 cm) Weight: 42.4 kg (93 lb 6.4 oz) IBW/kg (Calculated) : 54.7 TPN AdjBW (KG): 42.8 Body mass index is 16.03 kg/m.  Assessment: 87 y/o female with h/o GERD, HTN, CKD 3b, B12 deficiency, gout and COVID 19 (may 2022) who is admitted with SBO.   Glucose / Insulin: BG<100, stable, not on insulin/diabetic Electrolytes: stable at baseline and wnl Renal: SCr 1.32-->1.16 Hepatic: LFT, TG wnl at baseline Intake / Output; MIVF: 0.9% saline at 50 mL/hr GI Imaging:  10/27 New abdominal x-ray today shows persistent small bowel dilation without improvement. GI Surgeries / Procedures:  10/27: Exploratory laparotomy with enterolysis  Central access: 10/05/21 TPN start date: 10/05/21   Nutritional Goals: Goal TPN rate is 50 mL/hr (provides 74.4 g of protein and 1518.7 kcals per day)  RD Assessment: Estimated Needs Total Energy Estimated Needs: 1300-1500kcal/day Total Protein Estimated Needs: 65-75g/day Total Fluid Estimated Needs: 1.1-1.3L/day  Current Nutrition:  NPO  Plan:  Increase TPN to goal rate 50 mL/hr at 1800 (1200 mL total including overfill) Nutritional components Amino acids (using 15% Clinisol): 74.4 grams Dextrose: 226.8 grams Lipids (using 20% SMOFlipid): 45 grams Electrolytes in TPN (standard): Na 80mEq/L, K 15mEq/L, Ca 76mEq/L, Mg 12mEq/L, and Phos 32mmol/L. Cl:Ac 1:1 Add standard MVI, thiamine 100 mg and trace elements to TPN Initiate Sensitive q6h SSI and adjust as needed  Reduce MIVF to 25 mL/hr at 1800 Monitor TPN labs on Mon/Thurs, daily until stable  Markus Casten A Cailen Texeira 10/06/2021,10:27 AM

## 2021-10-06 NOTE — Progress Notes (Signed)
Patient ID: Kathy Villanueva, adult   DOB: [birth year], 87 y.o.   MRN: [id number]     [facility] Day(s): 5.   Interval History: Patient seen and examined, no acute events or new complaints overnight. Patient reports she wanted to get up and out of bed.  Patient today more active, alert and stronger than before surgery.  There has been no nausea or vomiting.  There has been no sign of GI function yet.  Vital signs in last 24 hours: [min-max] current  Temp:  [97.6 F (36.4 C)-98.2 F (36.8 C)] 97.8 F (36.6 C) (10/29 0804) Pulse Rate:  [70-71] 70 (10/29 0804) Resp:  [16-20] 18 (10/29 0804) BP: (136-166)/(66-80) 159/79 (10/29 0804) SpO2:  [94 %-100 %] 100 % (10/29 0804) FiO2 (%):  [16 %] 16 % (10/28 2049) Weight:  [42.4 kg] 42.4 kg (10/29 0500)     Height: 5\' 4"  (162.6 cm) Weight: 42.4 kg BMI (Calculated): 16.02   Physical Exam:  Constitutional: alert, cooperative and no distress  Respiratory: breathing non-labored at rest  Cardiovascular: regular rate and sinus rhythm  Gastrointestinal: soft, non-tender, and non-distended.  Wound is dry and clean  Labs:  CBC Latest Ref Rng & Units 10/06/2021 10/05/2021 10/02/2021  WBC 4.0 - 10.5 K/uL 8.4 8.0 6.7  Hemoglobin 12.0 - 15.0 g/dL 11.1(L) 11.7(L) 12.2  Hematocrit 36.0 - 46.0 % 34.0(L) 37.1 36.8  Platelets 150 - 400 K/uL 156 190 205   CMP Latest Ref Rng & Units 10/06/2021 10/05/2021 10/04/2021  Glucose 70 - 99 mg/dL 145(H) 97 70  BUN 8 - 23 mg/dL 34(H) 40(H) 42(H)  Creatinine 0.44 - 1.00 mg/dL 1.16(H) 1.40(H) 1.63(H)  Sodium 135 - 145 mmol/L 144 144 142  Potassium 3.5 - 5.1 mmol/L 3.7 3.7 3.6  Chloride 98 - 111 mmol/L 117(H) 114(H) 107  CO2 22 - 32 mmol/L 22 19(L) 20(L)  Calcium 8.9 - 10.3 mg/dL 8.2(L) 7.8(L) 8.4(L)  Total Protein 6.5 - 8.1 g/dL - 5.6(L) -  Total Bilirubin 0.3 - 1.2 mg/dL - 1.1 -  Alkaline Phos 38 - 126 U/L - 28(L) -  AST 15 - 41 U/L - 19 -  ALT 0 - 44 U/L - 10 -    Imaging studies: No new  pertinent imaging studies   Assessment/Plan:  87 y.o. adult with small bowel obstruction 2 Day Post-Op s/p exploratory laparotomy and lysis of adhesion, complicated by pertinent comorbidities including hypertension, dyslipidemia, gout and GERD.  Patient without any clinical deterioration.  Vital signs are stable.  No fever.  No tachycardia.  NGT with adequate output.  We will keep NGT in place until we have adequate GI function.  Patient is okay to get ice chips.  No contraindication for physical therapy.  Patient to get out of bed and do physical therapy as needed.  Appreciate hospitalist management of medical comorbidities.  I will continue to follow closely.  Arnold Long, MD

## 2021-10-07 DIAGNOSIS — R5381 Other malaise: Secondary | ICD-10-CM

## 2021-10-07 DIAGNOSIS — E43 Unspecified severe protein-calorie malnutrition: Secondary | ICD-10-CM | POA: Diagnosis not present

## 2021-10-07 DIAGNOSIS — K56609 Unspecified intestinal obstruction, unspecified as to partial versus complete obstruction: Secondary | ICD-10-CM | POA: Diagnosis not present

## 2021-10-07 LAB — GLUCOSE, CAPILLARY
Glucose-Capillary: 106 mg/dL — ABNORMAL HIGH (ref 70–99)
Glucose-Capillary: 122 mg/dL — ABNORMAL HIGH (ref 70–99)
Glucose-Capillary: 124 mg/dL — ABNORMAL HIGH (ref 70–99)
Glucose-Capillary: 145 mg/dL — ABNORMAL HIGH (ref 70–99)

## 2021-10-07 LAB — BASIC METABOLIC PANEL
Anion gap: 6 (ref 5–15)
BUN: 31 mg/dL — ABNORMAL HIGH (ref 8–23)
CO2: 27 mmol/L (ref 22–32)
Calcium: 8.1 mg/dL — ABNORMAL LOW (ref 8.9–10.3)
Chloride: 109 mmol/L (ref 98–111)
Creatinine, Ser: 0.89 mg/dL (ref 0.44–1.00)
GFR, Estimated: 60 mL/min — ABNORMAL LOW (ref >60)
Glucose, Bld: 120 mg/dL — ABNORMAL HIGH (ref 70–99)
Potassium: 3.5 mmol/L (ref 3.5–5.1)
Sodium: 142 mmol/L (ref 135–145)

## 2021-10-07 LAB — PHOSPHORUS: Phosphorus: 2 mg/dL — ABNORMAL LOW (ref 2.5–4.6)

## 2021-10-07 LAB — MAGNESIUM: Magnesium: 1.7 mg/dL (ref 1.7–2.4)

## 2021-10-07 MED ORDER — PANTOPRAZOLE SODIUM 40 MG IV SOLR
40.0000 mg | Freq: Two times a day (BID) | INTRAVENOUS | Status: DC
  Administered 2021-10-07 – 2021-10-14 (×15): 40 mg via INTRAVENOUS
  Filled 2021-10-07 (×14): qty 40

## 2021-10-07 MED ORDER — SODIUM PHOSPHATES 45 MMOLE/15ML IV SOLN
30.0000 mmol | Freq: Once | INTRAVENOUS | Status: AC
  Administered 2021-10-07: 30 mmol via INTRAVENOUS
  Filled 2021-10-07: qty 10

## 2021-10-07 MED ORDER — MAGNESIUM SULFATE 2 GM/50ML IV SOLN
2.0000 g | Freq: Once | INTRAVENOUS | Status: AC
  Administered 2021-10-07: 2 g via INTRAVENOUS
  Filled 2021-10-07: qty 50

## 2021-10-07 MED ORDER — POTASSIUM CHLORIDE 10 MEQ/50ML IV SOLN
10.0000 meq | INTRAVENOUS | Status: AC
  Administered 2021-10-07 (×3): 10 meq via INTRAVENOUS
  Filled 2021-10-07 (×3): qty 50

## 2021-10-07 MED ORDER — TRACE MINERALS CU-MN-SE-ZN 300-55-60-3000 MCG/ML IV SOLN
INTRAVENOUS | Status: AC
  Filled 2021-10-07: qty 496

## 2021-10-07 NOTE — Progress Notes (Signed)
PHARMACY - TOTAL PARENTERAL NUTRITION CONSULT NOTE   Indication: Small bowel obstruction  Patient Measurements: Height: 5\' 4"  (162.6 cm) Weight: 40.8 kg (89 lb 14.4 oz) IBW/kg (Calculated) : 54.7 TPN AdjBW (KG): 42.8 Body mass index is 15.43 kg/m.  Assessment: 87 y/o female with h/o GERD, HTN, CKD 3b, B12 deficiency, gout and COVID 19 (may 2022) who is admitted with SBO.   Glucose / Insulin: BG<100, stable, not on insulin/diabetic Electrolytes: K 3.5(KCL 10 mEq x 3 doses), Mg 1.7(Mag sulfate 2g IV x 1), Phos 2.0(Kphos 16mmol IV x 1) Renal: SCr 1.32-->0.89 Hepatic: LFT, TG wnl at baseline Intake / Output; MIVF: 0.9% saline at 50 mL/hr GI Imaging:  10/27 New abdominal x-ray today shows persistent small bowel dilation without improvement. GI Surgeries / Procedures:  10/27: Exploratory laparotomy with enterolysis  Central access: 10/05/21 TPN start date: 10/05/21   Nutritional Goals: Goal TPN rate is 50 mL/hr (provides 74.4 g of protein and 1518.7 kcals per day)  RD Assessment: Estimated Needs Total Energy Estimated Needs: 1300-1500kcal/day Total Protein Estimated Needs: 65-75g/day Total Fluid Estimated Needs: 1.1-1.3L/day  Current Nutrition:  NPO  Plan:  Continue TPN to goal rate 50 mL/hr at 1800 (1300 mL total including overfill) Nutritional components Amino acids (using 15% Clinisol): 74.4 grams Dextrose: 226.8 grams Lipids (using 20% SMOFlipid): 45 grams Electrolytes in TPN (standard): Na 39mEq/L, K 11mEq/L, Ca 23mEq/L, Mg 70mEq/L, and Phos 55mmol/L. Cl:Ac 1:1 Add standard MVI, thiamine 100 mg and trace elements to TPN Initiate Sensitive q6h SSI and adjust as needed   MIVF: 25 mL/hr Monitor TPN labs on Mon/Thurs, daily until stable  Dutch Ing A Shuaib Corsino 10/07/2021,10:32 AM

## 2021-10-07 NOTE — Progress Notes (Signed)
PROGRESS NOTE    Kathy Villanueva  LFY:[id number] DOB: 1924/03/24 DOA: 10/01/2021 PCP: Baxter Hire, MD    Brief Narrative:  Kathy Villanueva is a 87 year old female with past medical history significant for essential hypertension, dyslipidemia, gout, GERD, osteoarthritis who presented to [facility] ED on 10/24 with generalized abdominal pain.  Progressing over the last 4-5 days with associated nausea and vomiting.  Patient also with diminished bowel movements.  Denies fever/chills, no chest pain or palpitations, no diarrhea.  In the ED, BP 145/69, otherwise vital signs within normal limits.  Labs revealed a BUN of 29 with a creatinine of 1.32.  CBC unrevealing.  Influenza A/B PCR negative.  Covid-19 PCR negative.  CT abdomen/pelvis with multiple dilated fluid-filled loops of small bowel with relatively decompressed distal small bowel and colon consistent with bowel obstruction; although discrete transition point difficult to visualize.  NG tube was placed.  General surgery was consulted.  TRH consulted for further evaluation and management of small bowel obstruction.   Assessment & Plan:   Active Problems:   SBO (small bowel obstruction) (HCC)   Protein-calorie malnutrition, severe   Small bowel obstruction Patient presenting to the ED with abdominal pain, abdominal distention with associated nausea/vomiting and decreased bowel movements.  CT abdomen/pelvis notable for multiple dilated fluid-filled loops of small bowel with relatively decompressed distal small bowel and colon consistent with bowel obstruction; although discrete transition point difficult to visualize.  Patient unfortunately failed conservative measures with NG tube and underwent exploratory laparotomy by general surgery on 10/04/2021 with enterolysis. --continue TPN, NGT to suction, NPO except Ice chips --Further per general surgery  Hypomagnesemia:  Magnesium 1.7 this morning; will replete  Essential hypertension At home on  amlodipine and hydrochlorothiazide. --Holding oral antihypertensives given n.p.o.  --Hydralazine 10 mg IV q6h prn  CKD stage IIIb --Cr 1.32>1.21>1.63>1.40>1.16>0.89 --Continue IV fluids, TPN starting  Vitamin B12 deficiency --Continue to hold oral vitamin B12 supplement while NPO  HLD: Not currently on statin outpatient  Severe protein calorie malnutrition Body mass index is 15.43 kg/m. Nutrition Status: Nutrition Problem: Severe Malnutrition Etiology: social / environmental circumstances Signs/Symptoms: severe fat depletion, severe muscle depletion  --Dietitian following, continues NPO following surgery; started on TPN  Weakness/debility/deconditioning: --PT/OT currently recommending SNF. --Continue therapy efforts while inpatient --TOC following for SNF placement once medically stable    DVT prophylaxis: Place and maintain sequential compression device Start: 10/04/21 1307 enoxaparin (LOVENOX) injection 30 mg Start: 10/02/21 0800   Code Status: Full Code Family Communication: No family present at bedside this morning  Disposition Plan:  Level of care: Med-Surg Status is: Inpatient  Remains inpatient appropriate because: Continued small bowel obstruction s/p exploratory laparotomy with enterolysis, and now with postop ileus on TPN, remains n.p.o. will eventually need SNF placement when tolerating diet and off of TPN.     Consultants:  General surgery  Procedures:  NG tube placement Exploratory laparotomy with enterolysis, general surgery 10/27  Antimicrobials:  None   Subjective: Patient seen examined bedside, resting comfortably.  No family present.  Continues with abdominal soreness.  Denies flatus or bowel movement.  Continues with NG tube to suction.  On TPN.  No other questions or concerns at this time.  Patient denies headache, no fever, no nausea/vomiting/diarrhea, no chest pain, no shortness of breath, no palpitations.  No acute concerns overnight per  nursing staff.  Objective: Vitals:   10/07/21 0256 10/07/21 0347 10/07/21 0358 10/07/21 0802  BP:  138/77  (!) 157/77  Pulse:  68  70  Resp:    16  Temp:   98.9 F (37.2 C) 97.8 F (36.6 C)  TempSrc:  Oral    SpO2:  98%  97%  Weight: 40.8 kg     Height:        Intake/Output Summary (Last 24 hours) at 10/07/2021 1138 Last data filed at 10/07/2021 0736 Gross per 24 hour  Intake 2646.79 ml  Output 1075 ml  Net 1571.79 ml   Filed Weights   10/05/21 0500 10/06/21 0500 10/07/21 0256  Weight: 42.7 kg 42.4 kg 40.8 kg    Examination:  General exam: Appears calm and comfortable, elderly/cachectic in appearance with muscle loss/fat wasting appreciated, NAD Respiratory system: Clear to auscultation. Respiratory effort normal.  On room air Cardiovascular system: S1 & S2 heard, RRR. No JVD, murmurs, rubs, gallops or clicks. No pedal edema. Gastrointestinal system: Abdomen is distended, soft with mild tenderness, central abdominal dressing/honeycomb noted with some dried blood on dressing,. No organomegaly or masses felt.  Faint, soft bowel sounds noted. Central nervous system: Alert and oriented. No focal neurological deficits. Extremities: Symmetric 5 x 5 power. Skin: No rashes, lesions or ulcers Psychiatry: Judgement and insight appear normal. Mood & affect appropriate.     Data Reviewed: I have personally reviewed following labs and imaging studies  CBC: Recent Labs  Lab 10/01/21 1434 10/02/21 0633 10/05/21 0554 10/06/21 0444  WBC 9.0 6.7 8.0 8.4  HGB 12.3 12.2 11.7* 11.1*  HCT 38.1 36.8 37.1 34.0*  MCV 91.8 91.3 94.2 91.6  PLT 229 205 190 762   Basic Metabolic Panel: Recent Labs  Lab 10/02/21 0633 10/04/21 0713 10/05/21 0554 10/06/21 0444 10/07/21 0356  NA 135 142 144 144 142  K 3.9 3.6 3.7 3.7 3.5  CL 101 107 114* 117* 109  CO2 23 20* 19* 22 27  GLUCOSE 80 70 97 145* 120*  BUN 32* 42* 40* 34* 31*  CREATININE 1.21* 1.63* 1.40* 1.16* 0.89  CALCIUM 8.4*  8.4* 7.8* 8.2* 8.1*  MG 1.5* 2.3 1.9 1.9 1.7  PHOS 4.0  --  4.0 2.0* 2.0*   GFR: Estimated Creatinine Clearance (by C-G formula based on SCr of 0.89 mg/dL) Female: 24.4 mL/min Female: 28.7 mL/min Liver Function Tests: Recent Labs  Lab 10/01/21 1434 10/05/21 0554 10/06/21 0444  AST 25 19  --   ALT 14 10  --   ALKPHOS 30* 28*  --   BILITOT 1.0 1.1  --   PROT 7.0 5.6*  --   ALBUMIN 3.9 2.8* 2.6*   Recent Labs  Lab 10/01/21 1434  LIPASE 34   No results for input(s): AMMONIA in the last 168 hours. Coagulation Profile: No results for input(s): INR, PROTIME in the last 168 hours. Cardiac Enzymes: No results for input(s): CKTOTAL, CKMB, CKMBINDEX, TROPONINI in the last 168 hours. BNP (last 3 results) No results for input(s): PROBNP in the last 8760 hours. HbA1C: No results for input(s): HGBA1C in the last 72 hours. CBG: Recent Labs  Lab 10/06/21 0517 10/06/21 1218 10/06/21 1803 10/07/21 0003 10/07/21 0509  GLUCAP 132* 116* 120* 145* 124*   Lipid Profile: Recent Labs    10/05/21 0554  TRIG 70   Thyroid Function Tests: No results for input(s): TSH, T4TOTAL, FREET4, T3FREE, THYROIDAB in the last 72 hours. Anemia Panel: No results for input(s): VITAMINB12, FOLATE, FERRITIN, TIBC, IRON, RETICCTPCT in the last 72 hours. Sepsis Labs: No results for input(s): PROCALCITON, LATICACIDVEN in the last 168 hours.  Recent Results (from the past 240 hour(s))  Resp Panel by RT-PCR (Flu A&B, Covid) Nasopharyngeal Swab     Status: None   Collection Time: 10/01/21  3:52 PM   Specimen: Nasopharyngeal Swab; Nasopharyngeal(NP) swabs in vial transport medium  Result Value Ref Range Status   SARS Coronavirus 2 by RT PCR NEGATIVE NEGATIVE Final    Comment: (NOTE) SARS-CoV-2 target nucleic acids are NOT DETECTED.  The SARS-CoV-2 RNA is generally detectable in upper respiratory specimens during the acute phase of infection. The lowest concentration of SARS-CoV-2 viral copies this assay  can detect is 138 copies/mL. A negative result does not preclude SARS-Cov-2 infection and should not be used as the sole basis for treatment or other patient management decisions. A negative result may occur with  improper specimen collection/handling, submission of specimen other than nasopharyngeal swab, presence of viral mutation(s) within the areas targeted by this assay, and inadequate number of viral copies(<138 copies/mL). A negative result must be combined with clinical observations, patient history, and epidemiological information. The expected result is Negative.  Fact Sheet for Patients:  EntrepreneurPulse.com.au  Fact Sheet for Healthcare Providers:  IncredibleEmployment.be  This test is no t yet approved or cleared by the [place] FDA and  has been authorized for detection and/or diagnosis of SARS-CoV-2 by FDA under an Emergency Use Authorization (EUA). This EUA will remain  in effect (meaning this test can be used) for the duration of the COVID-19 declaration under Section 564(b)(1) of the Act, 21 U.S.C.section 360bbb-3(b)(1), unless the authorization is terminated  or revoked sooner.       Influenza A by PCR NEGATIVE NEGATIVE Final   Influenza B by PCR NEGATIVE NEGATIVE Final    Comment: (NOTE) The Xpert Xpress SARS-CoV-2/FLU/RSV plus assay is intended as an aid in the diagnosis of influenza from Nasopharyngeal swab specimens and should not be used as a sole basis for treatment. Nasal washings and aspirates are unacceptable for Xpert Xpress SARS-CoV-2/FLU/RSV testing.  Fact Sheet for Patients: EntrepreneurPulse.com.au  Fact Sheet for Healthcare Providers: IncredibleEmployment.be  This test is not yet approved or cleared by the [place] FDA and has been authorized for detection and/or diagnosis of SARS-CoV-2 by FDA under an Emergency Use Authorization (EUA). This EUA will  remain in effect (meaning this test can be used) for the duration of the COVID-19 declaration under Section 564(b)(1) of the Act, 21 U.S.C. section 360bbb-3(b)(1), unless the authorization is terminated or revoked.  Performed at [facility], [address], [place], [place] [address]          Radiology Studies: DG Chest [place] 1 View  Result Date: 10/05/2021 CLINICAL DATA:  PICC line placement EXAM: PORTABLE CHEST 1 VIEW COMPARISON:  Portable exam 1629 hours compared to 06/27/2014 FINDINGS: Nasogastric tube extends into stomach. LEFT arm PICC line tip projects over SVC. Normal heart size, mediastinal contours, and pulmonary vascularity. Minimal bibasilar atelectasis and questionable tiny pleural effusions. Remaining lungs clear. No pneumothorax or acute osseous findings. IMPRESSION: Minimal bibasilar atelectasis and questionable tiny pleural effusions. Electronically Signed   By: Lavonia Dana M.D.   On: 10/05/2021 16:40        Scheduled Meds:  Chlorhexidine Gluconate Cloth  6 each Topical Daily   enoxaparin (LOVENOX) injection  30 mg Subcutaneous Q24H   insulin aspart  0-9 Units Subcutaneous Q6H   sodium chloride flush  10-40 mL Intracatheter Q12H   Continuous Infusions:  sodium chloride 25 mL/hr at 10/07/21 0342   potassium chloride 10 mEq (10/07/21 1105)   sodium phosphate  Dextrose 5%  IVPB     TPN ADULT (ION) 50 mL/hr at 10/07/21 0342   TPN ADULT (ION)       LOS: 6 days    Time spent: 38 minutes spent on chart review, discussion with nursing staff, consultants, updating family and interview/physical exam; more than 50% of that time was spent in counseling and/or coordination of care.    Millena Callins J [place] DO [facility] Available via Epic secure chat 7am-7pm After these hours, please refer to coverage provider listed on [email or web address] 10/07/2021, 11:38 AM

## 2021-10-07 NOTE — Progress Notes (Signed)
Patient ID: Kathy Villanueva, adult   DOB: [birth year], 87 y.o.   MRN: [id number]     [place] [facility] Day(s): 6.   Interval History: Patient seen and examined, no acute events or new complaints overnight. Patient reports feeling uncomfortable but no significant pain.  Denies any nausea or vomiting.  Denies passing gas or stool.  Vital signs in last 24 hours: [min-max] current  Temp:  [97.8 F (36.6 C)-98.9 F (37.2 C)] 97.8 F (36.6 C) (10/30 0802) Pulse Rate:  [68-72] 70 (10/30 0802) Resp:  [16-20] 16 (10/30 0802) BP: (138-157)/(70-77) 157/77 (10/30 0802) SpO2:  [94 %-98 %] 97 % (10/30 0802) Weight:  [40.8 kg] 40.8 kg (10/30 0256)     Height: 5\' 4"  (162.6 cm) Weight: 40.8 kg BMI (Calculated): 15.42   Physical Exam:  Constitutional: alert, cooperative and no distress  Respiratory: breathing non-labored at rest  Cardiovascular: regular rate and sinus rhythm  Gastrointestinal: soft, non-tender, and non-distended.  Wound dry and clean  Labs:  CBC Latest Ref Rng & Units 10/06/2021 10/05/2021 10/02/2021  WBC 4.0 - 10.5 K/uL 8.4 8.0 6.7  Hemoglobin 12.0 - 15.0 g/dL 11.1(L) 11.7(L) 12.2  Hematocrit 36.0 - 46.0 % 34.0(L) 37.1 36.8  Platelets 150 - 400 K/uL 156 190 205   CMP Latest Ref Rng & Units 10/07/2021 10/06/2021 10/05/2021  Glucose 70 - 99 mg/dL 120(H) 145(H) 97  BUN 8 - 23 mg/dL 31(H) 34(H) 40(H)  Creatinine 0.44 - 1.00 mg/dL 0.89 1.16(H) 1.40(H)  Sodium 135 - 145 mmol/L 142 144 144  Potassium 3.5 - 5.1 mmol/L 3.5 3.7 3.7  Chloride 98 - 111 mmol/L 109 117(H) 114(H)  CO2 22 - 32 mmol/L 27 22 19(L)  Calcium 8.9 - 10.3 mg/dL 8.1(L) 8.2(L) 7.8(L)  Total Protein 6.5 - 8.1 g/dL - - 5.6(L)  Total Bilirubin 0.3 - 1.2 mg/dL - - 1.1  Alkaline Phos 38 - 126 U/L - - 28(L)  AST 15 - 41 U/L - - 19  ALT 0 - 44 U/L - - 10    Imaging studies: No new pertinent imaging studies   Assessment/Plan:  87 y.o. adult with small bowel obstruction 3 Day Post-Op s/p exploratory  laparotomy and lysis of adhesion, complicated by pertinent comorbidities including hypertension, dyslipidemia, gout and GERD.  Patient today found stable.  There is no fever and no tachycardia.  NGT with adequate output.  Patient taking ice chips.  Some bloody output from the NGT.  I started patient on Protonix twice daily.  I recommend to continue NGT to low intermittent suction until patient has adequate bowel function.  Continue replacing electrolytes as needed.  Continue IV hydration.  Continue TPN.  Continue DVT prophylaxis.  No contraindication for PT to take patient out of bed.  Arnold Long, MD

## 2021-10-08 LAB — COMPREHENSIVE METABOLIC PANEL
ALT: 9 U/L (ref 0–44)
AST: 16 U/L (ref 15–41)
Albumin: 2.3 g/dL — ABNORMAL LOW (ref 3.5–5.0)
Alkaline Phosphatase: 36 U/L — ABNORMAL LOW (ref 38–126)
Anion gap: 6 (ref 5–15)
BUN: 39 mg/dL — ABNORMAL HIGH (ref 8–23)
CO2: 27 mmol/L (ref 22–32)
Calcium: 7.8 mg/dL — ABNORMAL LOW (ref 8.9–10.3)
Chloride: 107 mmol/L (ref 98–111)
Creatinine, Ser: 0.87 mg/dL (ref 0.44–1.00)
GFR, Estimated: >60 mL/min (ref >60)
Glucose, Bld: 112 mg/dL — ABNORMAL HIGH (ref 70–99)
Potassium: 4.2 mmol/L (ref 3.5–5.1)
Sodium: 140 mmol/L (ref 135–145)
Total Bilirubin: 0.5 mg/dL (ref 0.3–1.2)
Total Protein: 5 g/dL — ABNORMAL LOW (ref 6.5–8.1)

## 2021-10-08 LAB — TRIGLYCERIDES: Triglycerides: 66 mg/dL (ref <150)

## 2021-10-08 LAB — GLUCOSE, CAPILLARY
Glucose-Capillary: 118 mg/dL — ABNORMAL HIGH (ref 70–99)
Glucose-Capillary: 107 mg/dL — ABNORMAL HIGH (ref 70–99)

## 2021-10-08 LAB — PHOSPHORUS: Phosphorus: 3.7 mg/dL (ref 2.5–4.6)

## 2021-10-08 LAB — MAGNESIUM: Magnesium: 1.9 mg/dL (ref 1.7–2.4)

## 2021-10-08 MED ORDER — TRACE MINERALS CU-MN-SE-ZN 300-55-60-3000 MCG/ML IV SOLN
INTRAVENOUS | Status: AC
  Filled 2021-10-08: qty 496

## 2021-10-08 NOTE — Care Management Important Message (Signed)
Important Message  Patient Details  Name: Kathy Villanueva MRN: [id number] Date of Birth: 12-Dec-1923   Medicare Important Message Given:  Yes     Dannette Barbara 10/08/2021, 1:25 PM

## 2021-10-08 NOTE — Progress Notes (Signed)
Occupational Therapy Treatment Patient Details Name: Kathy Villanueva MRN: [id number] DOB: [birth year] Today's Date: 10/08/2021   History of present illness presented to ER secondary to abdominal pain, vomiting; admitted for management of SBO (likely due to adhesions).  Initial recommendation for conservative measures; monitoring daily for status and need for surgical intervention. Now, pt is s/p Lapartomy, currently POD 4 at time of re-evaluation.   OT comments  Pt seen for OT treatment on this date. Upon arrival to room, pt in semi-fowlers position in bed. Pt reporting recently returning to bed after sitting in chair and declining OOB mobility with OT, however agreeable to seated ADLs at EOB. Pt currently presents with decreased strength and activity tolerance. Pt currently requires MIN-MOD A for bed mobility and MIN-MOD A for seated UB ADLs. This date, pt tolerated unsupported sitting EOB for 15 mins while engaging in seated grooming tasks and UB dressing, before becoming fatigued. SpO2 >92% throughout session. At end of session, pt returned to bed, in semi fowler's position, with all needs within reach. Pt is making good progress toward goals. Pt continues to benefit from skilled OT services to maximize return to PLOF and minimize risk of future falls, injury, caregiver burden, and readmission. Will continue to follow POC. Discharge recommendation remains appropriate.     Recommendations for follow up therapy are one component of a multi-disciplinary discharge planning process, led by the attending physician.  Recommendations may be updated based on patient status, additional functional criteria and insurance authorization.    Follow Up Recommendations  Skilled nursing-short term rehab (<3 hours/day)    Assistance Recommended at Discharge Frequent or constant Supervision/Assistance  Equipment Recommendations  Other (comment) (defer to next venue of care)       Precautions / Restrictions  Precautions Precautions: Fall Precaution Comments: NPO/NGT Restrictions Weight Bearing Restrictions: No       Mobility Bed Mobility Overal bed mobility: Needs Assistance Bed Mobility: Supine to Sit;Sit to Supine     Supine to sit: Min assist;HOB elevated Sit to supine: Mod assist   General bed mobility comments: needs assist for sliding B LEs and initiating mobility. ONce seated at EOB, post leaning requiring constant min assist progressing to cga    Transfers Overall transfer level: Needs assistance Equipment used: Rolling walker (2 wheels) Transfers: Sit to/from Stand Sit to Stand: Mod assist           General transfer comment: pt declining to participate in OOB mobility following recently returning to bed     Balance Overall balance assessment: Needs assistance Sitting-balance support: Feet supported;Bilateral upper extremity supported Sitting balance-Leahy Scale: Fair Sitting balance - Comments: Able to maintain seated balance at EOB during seated grooming/UB dressing. After 15 mins, pt fatigued and posterior/lateral lean observed, however able to correct following verbal and tactile cues Postural control: Posterior lean;Left lateral lean Standing balance support: Bilateral upper extremity supported Standing balance-Leahy Scale: Poor                             ADL either performed or assessed with clinical judgement   ADL Overall ADL's : Needs assistance/impaired     Grooming: Oral care;Wash/dry face;Moderate assistance;Wash/dry hands;Supervision/safety;Set up;Sitting Grooming Details (indicate cue type and reason): With oral swab, pt able to bring to face however requires HHA to guide swab into mouth and MOD A ensure thoroughness of oral care.         Upper Body Dressing : Minimal assistance;Sitting Upper Body  Dressing Details (indicate cue type and reason): to don/doff hospital gown; MIN A d/t difficultly sequencing                           Cognition Arousal/Alertness: Awake/alert Behavior During Therapy: WFL for tasks assessed/performed Overall Cognitive Status: No family/caregiver present to determine baseline cognitive functioning                                 General Comments: Pt requires increased processing time and fatigues quickly          Exercises Exercises: Other exercises Other Exercises Other Exercises: supine ther-ex performed on B LE including AP, SAQ, SLRs, and hip add squeezes. 10 reps with cga           Pertinent Vitals/ Pain       Pain Assessment: No/denies pain         Frequency  Min 1X/week        Progress Toward Goals  OT Goals(current goals can now be found in the care plan section)  Progress towards OT goals: Progressing toward goals  Acute Rehab OT Goals Patient Stated Goal: none stated OT Goal Formulation: With patient Time For Goal Achievement: 10/17/21  Plan Discharge plan remains appropriate;Frequency remains appropriate       AM-PAC OT "6 Clicks" Daily Activity     Outcome Measure   Help from another person eating meals?: A Little Help from another person taking care of personal grooming?: A Lot Help from another person toileting, which includes using toliet, bedpan, or urinal?: A Lot Help from another person bathing (including washing, rinsing, drying)?: A Lot Help from another person to put on and taking off regular upper body clothing?: A Little Help from another person to put on and taking off regular lower body clothing?: A Lot 6 Click Score: 14    End of Session    OT Visit Diagnosis: Muscle weakness (generalized) (M62.81)   Activity Tolerance Patient tolerated treatment well   Patient Left in bed;with call bell/phone within reach;with bed alarm set   Nurse Communication Mobility status        Time: 1102-1130 OT Time Calculation (min): 28 min  Charges: OT General Charges $OT Visit: 1 Visit OT Treatments $Self Care/Home  Management : 23-37 mins  Fredirick Maudlin, OTR/L [place]

## 2021-10-08 NOTE — Progress Notes (Signed)
Physical Therapy Treatment Patient Details Name: Kathy Villanueva MRN: [id number] DOB: 1924/10/02 Today's Date: 10/08/2021   History of Present Illness presented to ER secondary to abdominal pain, vomiting; admitted for management of SBO (likely due to adhesions).  Initial recommendation for conservative measures; monitoring daily for status and need for surgical intervention. Now, pt is s/p Lapartomy, currently POD 4 at time of re-evaluation.    PT Comments    Pt is POD 4 from Laprotomy and is re-assessed this date. Pt willing to perform OOB mobility and reports no pain at this time. RW used and pt able to ambulate short distance in room. Still needs significant assistance with RW and demonstrates poor balance. Very high falls risk. Agreeable to sit in recliner with all needs in reach. Will continue to progress as able. Still recommending SNF placement.   Recommendations for follow up therapy are one component of a multi-disciplinary discharge planning process, led by the attending physician.  Recommendations may be updated based on patient status, additional functional criteria and insurance authorization.  Follow Up Recommendations  Skilled nursing-short term rehab (<3 hours/day)     Assistance Recommended at Discharge Frequent or constant Supervision/Assistance  Equipment Recommendations       Recommendations for Other Services       Precautions / Restrictions Precautions Precautions: Fall Precaution Comments: NPO/NGT Restrictions Weight Bearing Restrictions: No     Mobility  Bed Mobility Overal bed mobility: Needs Assistance Bed Mobility: Supine to Sit;Sit to Supine     Supine to sit: Mod assist     General bed mobility comments: needs assist for sliding B LEs and initiating mobility. ONce seated at EOB, post leaning requiring constant min assist progressing to cga    Transfers Overall transfer level: Needs assistance Equipment used: Rolling walker (2  wheels) Transfers: Sit to/from Stand Sit to Stand: Mod assist           General transfer comment: needs assist for upright posture and intiiating mobility    Ambulation/Gait Ambulation/Gait assistance: Min assist;Mod assist Gait Distance (Feet): 5 Feet Assistive device: Rolling walker (2 wheels) Gait Pattern/deviations: Step-to pattern     General Gait Details: needs constant facilitating gait pattern with poor judgement and trying to sit prior to being infront of seated surface. RW used with ability to take several small steps in room as NG disconnected from suction this session. Quick fatigue.   Stairs             Wheelchair Mobility    Modified Rankin (Stroke Patients Only)       Balance Overall balance assessment: Needs assistance Sitting-balance support: Feet supported;Bilateral upper extremity supported Sitting balance-Leahy Scale: Fair     Standing balance support: Bilateral upper extremity supported Standing balance-Leahy Scale: Poor                              Cognition Arousal/Alertness: Awake/alert Behavior During Therapy: WFL for tasks assessed/performed Overall Cognitive Status: Within Functional Limits for tasks assessed                                 General Comments: delayed processessing and HOH        Exercises Other Exercises Other Exercises: supine ther-ex performed on B LE including AP, SAQ, SLRs, and hip add squeezes. 10 reps with cga    General Comments  Pertinent Vitals/Pain Pain Assessment: No/denies pain    Home Living                          Prior Function            PT Goals (current goals can now be found in the care plan section) Acute Rehab PT Goals Patient Stated Goal: per daughter in law, to maintain strength as able PT Goal Formulation: With patient Time For Goal Achievement: 10/17/21 Potential to Achieve Goals: Good Progress towards PT goals: Progressing  toward goals    Frequency    Min 2X/week      PT Plan Current plan remains appropriate    Co-evaluation              AM-PAC PT "6 Clicks" Mobility   Outcome Measure  Help needed turning from your back to your side while in a flat bed without using bedrails?: A Little Help needed moving from lying on your back to sitting on the side of a flat bed without using bedrails?: A Lot Help needed moving to and from a bed to a chair (including a wheelchair)?: A Lot Help needed standing up from a chair using your arms (e.g., wheelchair or bedside chair)?: A Lot Help needed to walk in hospital room?: Total Help needed climbing 3-5 steps with a railing? : Total 6 Click Score: 11    End of Session   Activity Tolerance: Patient limited by fatigue Patient left: in chair;with chair alarm set Nurse Communication: Mobility status PT Visit Diagnosis: Muscle weakness (generalized) (M62.81);Difficulty in walking, not elsewhere classified (R26.2);Pain     Time: 2706-2376 PT Time Calculation (min) (ACUTE ONLY): 23 min  Charges:  $Gait Training: 8-22 mins $Therapeutic Exercise: 8-22 mins                     Kathy Villanueva, PT, DPT [phone]    Kathy Villanueva 10/08/2021, 10:36 AM

## 2021-10-08 NOTE — Progress Notes (Signed)
Made Dr. Windell Moment aware of patient complaining of abdominal cramps.  No bowel movement or passing flatus. Per MD connect NGT to low intermittent suction. No signs of distress noted at this time. Will continue to monitor.

## 2021-10-08 NOTE — Progress Notes (Signed)
NG tube clamped as per md's order.

## 2021-10-08 NOTE — Progress Notes (Signed)
PHARMACY - TOTAL PARENTERAL NUTRITION CONSULT NOTE   Indication: Small bowel obstruction  Patient Measurements: Height: 5\' 4"  (162.6 cm) Weight: 44.7 kg (98 lb 8 oz) IBW/kg (Calculated) : 54.7 TPN AdjBW (KG): 42.8 Body mass index is 16.91 kg/m.  Assessment: 87 y/o female with h/o GERD, HTN, CKD 3b, B12 deficiency, gout and COVID 19 (may 2022) who is admitted with SBO.   Glucose / Insulin: BG 106 - 112, stable, not  requiring insulin / non-diabetic Electrolytes: WNL today Renal: SCr 1.32-->0.87 Hepatic: LFT, TG wnl at baseline Intake / Output; MIVF: 0.9% saline at 25 mL/hr GI Imaging:  10/27 abdominal x-ray shows persistent small bowel dilation without improvement. GI Surgeries / Procedures:  10/27: Exploratory laparotomy with enterolysis  Central access: 10/05/21 TPN start date: 10/05/21   Nutritional Goals: Goal TPN rate is 50 mL/hr (provides 74.4 g of protein and 1518.7 kcals per day)  RD Assessment: Estimated Needs Total Energy Estimated Needs: 1300-1500kcal/day Total Protein Estimated Needs: 65-75g/day Total Fluid Estimated Needs: 1.1-1.3L/day  Current Nutrition:  NPO  Plan:  Continue TPN at goal rate of 50 mL/hr (total volume 1300 mL total including overfill) Nutritional components Amino acids (using 15% Clinisol): 74.4 grams Dextrose: 226.8 grams Lipids (using 20% SMOFlipid): 45 grams Electrolytes in TPN (standard): Na 46mEq/L, K 16mEq/L, Ca 66mEq/L, Mg 3mEq/L, and Phos 52mmol/L. Cl:Ac 1:1 Add standard MVI and trace elements to TPN Stop SSI  Stop MIVF Monitor TPN labs on Mon/Thurs, daily until stable  Dallie Piles 10/08/2021,7:02 AM

## 2021-10-08 NOTE — Progress Notes (Signed)
PROGRESS NOTE    Kathy Villanueva  NGE:[id number] DOB: 1924-08-12 DOA: 10/01/2021 PCP: Baxter Hire, MD    Brief Narrative:  Kathy Villanueva is a 87 year old female with past medical history significant for essential hypertension, dyslipidemia, gout, GERD, osteoarthritis who presented to [facility] ED on 10/24 with generalized abdominal pain.  Progressing over the last 4-5 days with associated nausea and vomiting.  Patient also with diminished bowel movements.  Denies fever/chills, no chest pain or palpitations, no diarrhea.  In the ED, BP 145/69, otherwise vital signs within normal limits.  Labs revealed a BUN of 29 with a creatinine of 1.32.  CBC unrevealing.  Influenza A/B PCR negative.  Covid-19 PCR negative.  CT abdomen/pelvis with multiple dilated fluid-filled loops of small bowel with relatively decompressed distal small bowel and colon consistent with bowel obstruction; although discrete transition point difficult to visualize.  NG tube was placed.  General surgery was consulted.  TRH consulted for further evaluation and management of small bowel obstruction.   Assessment & Plan:   Active Problems:   SBO (small bowel obstruction) (HCC)   Protein-calorie malnutrition, severe   Small bowel obstruction Patient presenting to the ED with abdominal pain, abdominal distention with associated nausea/vomiting and decreased bowel movements.  CT abdomen/pelvis notable for multiple dilated fluid-filled loops of small bowel with relatively decompressed distal small bowel and colon consistent with bowel obstruction; although discrete transition point difficult to visualize.  Patient unfortunately failed conservative measures with NG tube and underwent exploratory laparotomy by general surgery on 10/04/2021 with enterolysis. --continue TPN, NGT currently clamped, NPO except Ice chips --Further per general surgery  Hypomagnesemia:  Magnesium 1.9 this morning; will replete  Essential hypertension At  home on amlodipine and hydrochlorothiazide. --Holding oral antihypertensives given n.p.o.  --Hydralazine 10 mg IV q6h prn  CKD stage IIIb --Cr 1.32>1.21>1.63>1.40>1.16>0.89>0.87 --Continue IV fluids, TPN starting  Vitamin B12 deficiency --Continue to hold oral vitamin B12 supplement while NPO  HLD: Not currently on statin outpatient  Severe protein calorie malnutrition Body mass index is 16.91 kg/m. Nutrition Status: Nutrition Problem: Severe Malnutrition Etiology: social / environmental circumstances Signs/Symptoms: severe fat depletion, severe muscle depletion Interventions: TPN --Dietitian following, continues NPO following surgery; started on TPN  Weakness/debility/deconditioning: --PT/OT currently recommending SNF. --Continue therapy efforts while inpatient --TOC following for SNF placement once medically stable    DVT prophylaxis: Place and maintain sequential compression device Start: 10/04/21 1307 enoxaparin (LOVENOX) injection 30 mg Start: 10/02/21 0800   Code Status: Full Code Family Communication: No family present at bedside this morning  Disposition Plan:  Level of care: Med-Surg Status is: Inpatient  Remains inpatient appropriate because: Continued small bowel obstruction s/p exploratory laparotomy with enterolysis, and now with postop ileus on TPN, remains n.p.o. will eventually need SNF placement when tolerating diet and off of TPN.     Consultants:  General surgery  Procedures:  NG tube placement Exploratory laparotomy with enterolysis, general surgery 10/27  Antimicrobials:  None   Subjective: Patient seen examined bedside, resting comfortably.  No family present.  Continues with abdominal soreness.  Small bowel movement reported last night.  Continues with NG tube, now clamped per general surgery.  Remains on TPN.  No other questions or concerns at this time. Patient denies headache, no fever, no nausea/vomiting/diarrhea, no chest pain, no  shortness of breath, no palpitations.  No acute concerns overnight per nursing staff.  Objective: Vitals:   10/07/21 2010 10/08/21 0438 10/08/21 0539 10/08/21 0758  BP: (!) 149/78  131/78 (!) 146/84  Pulse: 75  83 71  Resp: 16  16 16   Temp: 98.9 F (37.2 C)  98.4 F (36.9 C) 98.3 F (36.8 C)  TempSrc: Oral  Oral Oral  SpO2: 97%  97% 99%  Weight:  44.7 kg    Height:        Intake/Output Summary (Last 24 hours) at 10/08/2021 1258 Last data filed at 10/08/2021 0445 Gross per 24 hour  Intake 1210.05 ml  Output 1001 ml  Net 209.05 ml   Filed Weights   10/06/21 0500 10/07/21 0256 10/08/21 0438  Weight: 42.4 kg 40.8 kg 44.7 kg    Examination:  General exam: Appears calm and comfortable, elderly/cachectic in appearance with muscle loss/fat wasting appreciated, NAD Respiratory system: Clear to auscultation. Respiratory effort normal.  On room air Cardiovascular system: S1 & S2 heard, RRR. No JVD, murmurs, rubs, gallops or clicks. No pedal edema. Gastrointestinal system: Abdomen is distended, soft with mild tenderness, abdominal incision noted with staples in place, No organomegaly or masses felt.  No appreciable bowel sounds this morning. Central nervous system: Alert and oriented. No focal neurological deficits. Extremities: Symmetric 5 x 5 power. Skin: No rashes, lesions or ulcers Psychiatry: Judgement and insight appear normal. Mood & affect appropriate.     Data Reviewed: I have personally reviewed following labs and imaging studies  CBC: Recent Labs  Lab 10/01/21 1434 10/02/21 0633 10/05/21 0554 10/06/21 0444  WBC 9.0 6.7 8.0 8.4  HGB 12.3 12.2 11.7* 11.1*  HCT 38.1 36.8 37.1 34.0*  MCV 91.8 91.3 94.2 91.6  PLT 229 205 190 606   Basic Metabolic Panel: Recent Labs  Lab 10/02/21 0633 10/04/21 0713 10/05/21 0554 10/06/21 0444 10/07/21 0356 10/08/21 0442  NA 135 142 144 144 142 140  K 3.9 3.6 3.7 3.7 3.5 4.2  CL 101 107 114* 117* 109 107  CO2 23 20* 19*  22 27 27   GLUCOSE 80 70 97 145* 120* 112*  BUN 32* 42* 40* 34* 31* 39*  CREATININE 1.21* 1.63* 1.40* 1.16* 0.89 0.87  CALCIUM 8.4* 8.4* 7.8* 8.2* 8.1* 7.8*  MG 1.5* 2.3 1.9 1.9 1.7 1.9  PHOS 4.0  --  4.0 2.0* 2.0* 3.7   GFR: Estimated Creatinine Clearance (by C-G formula based on SCr of 0.87 mg/dL) Female: 27.3 mL/min Female: 32.1 mL/min Liver Function Tests: Recent Labs  Lab 10/01/21 1434 10/05/21 0554 10/06/21 0444 10/08/21 0442  AST 25 19  --  16  ALT 14 10  --  9  ALKPHOS 30* 28*  --  36*  BILITOT 1.0 1.1  --  0.5  PROT 7.0 5.6*  --  5.0*  ALBUMIN 3.9 2.8* 2.6* 2.3*   Recent Labs  Lab 10/01/21 1434  LIPASE 34   No results for input(s): AMMONIA in the last 168 hours. Coagulation Profile: No results for input(s): INR, PROTIME in the last 168 hours. Cardiac Enzymes: No results for input(s): CKTOTAL, CKMB, CKMBINDEX, TROPONINI in the last 168 hours. BNP (last 3 results) No results for input(s): PROBNP in the last 8760 hours. HbA1C: No results for input(s): HGBA1C in the last 72 hours. CBG: Recent Labs  Lab 10/07/21 0509 10/07/21 1157 10/07/21 1807 10/08/21 0148 10/08/21 0537  GLUCAP 124* 106* 122* 118* 107*   Lipid Profile: Recent Labs    10/08/21 0442  TRIG 66   Thyroid Function Tests: No results for input(s): TSH, T4TOTAL, FREET4, T3FREE, THYROIDAB in the last 72 hours. Anemia Panel: No results for input(s): VITAMINB12, FOLATE, FERRITIN, TIBC, IRON,  RETICCTPCT in the last 72 hours. Sepsis Labs: No results for input(s): PROCALCITON, LATICACIDVEN in the last 168 hours.  Recent Results (from the past 240 hour(s))  Resp Panel by RT-PCR (Flu A&B, Covid) Nasopharyngeal Swab     Status: None   Collection Time: 10/01/21  3:52 PM   Specimen: Nasopharyngeal Swab; Nasopharyngeal(NP) swabs in vial transport medium  Result Value Ref Range Status   SARS Coronavirus 2 by RT PCR NEGATIVE NEGATIVE Final    Comment: (NOTE) SARS-CoV-2 target nucleic acids are NOT  DETECTED.  The SARS-CoV-2 RNA is generally detectable in upper respiratory specimens during the acute phase of infection. The lowest concentration of SARS-CoV-2 viral copies this assay can detect is 138 copies/mL. A negative result does not preclude SARS-Cov-2 infection and should not be used as the sole basis for treatment or other patient management decisions. A negative result may occur with  improper specimen collection/handling, submission of specimen other than nasopharyngeal swab, presence of viral mutation(s) within the areas targeted by this assay, and inadequate number of viral copies(<138 copies/mL). A negative result must be combined with clinical observations, patient history, and epidemiological information. The expected result is Negative.  Fact Sheet for Patients:  EntrepreneurPulse.com.au  Fact Sheet for Healthcare Providers:  IncredibleEmployment.be  This test is no t yet approved or cleared by the [place] FDA and  has been authorized for detection and/or diagnosis of SARS-CoV-2 by FDA under an Emergency Use Authorization (EUA). This EUA will remain  in effect (meaning this test can be used) for the duration of the COVID-19 declaration under Section 564(b)(1) of the Act, 21 U.S.C.section 360bbb-3(b)(1), unless the authorization is terminated  or revoked sooner.       Influenza A by PCR NEGATIVE NEGATIVE Final   Influenza B by PCR NEGATIVE NEGATIVE Final    Comment: (NOTE) The Xpert Xpress SARS-CoV-2/FLU/RSV plus assay is intended as an aid in the diagnosis of influenza from Nasopharyngeal swab specimens and should not be used as a sole basis for treatment. Nasal washings and aspirates are unacceptable for Xpert Xpress SARS-CoV-2/FLU/RSV testing.  Fact Sheet for Patients: EntrepreneurPulse.com.au  Fact Sheet for Healthcare Providers: IncredibleEmployment.be  This test is not yet  approved or cleared by the [place] FDA and has been authorized for detection and/or diagnosis of SARS-CoV-2 by FDA under an Emergency Use Authorization (EUA). This EUA will remain in effect (meaning this test can be used) for the duration of the COVID-19 declaration under Section 564(b)(1) of the Act, 21 U.S.C. section 360bbb-3(b)(1), unless the authorization is terminated or revoked.  Performed at [facility], [address], [place], [place] [address]          Radiology Studies: No results found.      Scheduled Meds:  Chlorhexidine Gluconate Cloth  6 each Topical Daily   enoxaparin (LOVENOX) injection  30 mg Subcutaneous Q24H   pantoprazole (PROTONIX) IV  40 mg Intravenous Q12H   sodium chloride flush  10-40 mL Intracatheter Q12H   Continuous Infusions:  TPN ADULT (ION) 50 mL/hr at 10/08/21 0320   TPN ADULT (ION)       LOS: 7 days    Time spent: 38 minutes spent on chart review, discussion with nursing staff, consultants, updating family and interview/physical exam; more than 50% of that time was spent in counseling and/or coordination of care.    Lexandra Rettke J [place] DO [facility] Available via Epic secure chat 7am-7pm After these hours, please refer to coverage provider listed on [email or web address]  10/08/2021, 12:58 PM

## 2021-10-08 NOTE — Progress Notes (Signed)
Nutrition Follow-up  DOCUMENTATION CODES:   Severe malnutrition in context of social or environmental circumstances  INTERVENTION:   -TPN management per pharmacy -RD will follow for diet advancement and adjust supplement regimen as appropriate  NUTRITION DIAGNOSIS:   Severe Malnutrition related to social / environmental circumstances as evidenced by severe fat depletion, severe muscle depletion.  Ongoing  GOAL:   Patient will meet greater than or equal to 90% of their needs  Met with TPN  MONITOR:   Diet advancement, Labs, Weight trends, Skin, I & O's  REASON FOR ASSESSMENT:   Other (Comment) (low BMI)    ASSESSMENT:   87 y/o female with h/o GERD, HTN, CKD 3b, B12 deficiency, gout and COVID 19 (may 2022) who is admitted with SBO.  10/27- s/p Exploratory laparotomy with enterolysis 10/28- PICC placed, TPN initiated  Reviewed I/O's: +616 ml x 24 hours and +4.1 L since admission  UOP: 1 L x 24 hours  NGT output: 250 ml x 24 hours  NGT now clamped per MD order.   Pt remains NPO.   TPN started on 10/05/21 (started delayed secondary to PICC placement). Currently receiving at 50 ml/hr, which provides 1518 kcals and 74 grams protein, meeting 100% of estimated kcal and protein needs.   Labs reviewed: K, Mg, and Phos WDL. CBGS: 106-122 (inpatient orders for glycemic control are none).    Diet Order:   Diet Order             Diet NPO time specified Except for: Ice Chips  Diet effective now                   EDUCATION NEEDS:   No education needs have been identified at this time  Skin:  Skin Assessment: Skin Integrity Issues: Skin Integrity Issues:: Incisions Incisions: closed abdomen  Last BM:  10/02/21  Height:   Ht Readings from Last 1 Encounters:  10/04/21 5' 4" (1.626 m)    Weight:   Wt Readings from Last 1 Encounters:  10/08/21 44.7 kg    Ideal Body Weight:  54.5 kg  BMI:  Body mass index is 16.91 kg/m.  Estimated Nutritional  Needs:   Kcal:  1350-1550  Protein:  65-80 grams  Fluid:  > 1.3 L    Loistine Chance, RD, LDN, [place] Registered Dietitian II Certified Diabetes Care and Education Specialist Please refer to [facility] for RD and/or RD on-call/weekend/after hours pager

## 2021-10-08 NOTE — Progress Notes (Signed)
Patient ID: Kathy Villanueva, adult   DOB: 04-25-1924, 87 y.o.   MRN: [id number]     [place] [facility] Day(s): 7.   Interval History: Patient seen and examined, no acute events or new complaints overnight. Patient reports feeling the same.  She denies any nausea or vomiting.  She denies any abdominal pain.  Flowsheet shows that she has a bowel movement.  Night shift nurse and the nurse of this morning are not aware of that episode.  I tried clamping the NG but patient started having abdominal cramps.  Patient back to suction  Vital signs in last 24 hours: [min-max] current  Temp:  [98.3 F (36.8 C)-98.9 F (37.2 C)] 98.3 F (36.8 C) (10/31 0758) Pulse Rate:  [71-83] 71 (10/31 0758) Resp:  [16] 16 (10/31 0758) BP: (131-149)/(78-84) 146/84 (10/31 0758) SpO2:  [97 %-99 %] 99 % (10/31 0758) Weight:  [44.7 kg] 44.7 kg (10/31 0438)     Height: 5\' 4"  (162.6 cm) Weight: 44.7 kg BMI (Calculated): 16.9   Physical Exam:  Constitutional: alert, cooperative and no distress  Respiratory: breathing non-labored at rest  Cardiovascular: regular rate and sinus rhythm  Gastrointestinal: soft, non-tender, and non-distended  Labs:  CBC Latest Ref Rng & Units 10/06/2021 10/05/2021 10/02/2021  WBC 4.0 - 10.5 K/uL 8.4 8.0 6.7  Hemoglobin 12.0 - 15.0 g/dL 11.1(L) 11.7(L) 12.2  Hematocrit 36.0 - 46.0 % 34.0(L) 37.1 36.8  Platelets 150 - 400 K/uL 156 190 205   CMP Latest Ref Rng & Units 10/08/2021 10/07/2021 10/06/2021  Glucose 70 - 99 mg/dL 112(H) 120(H) 145(H)  BUN 8 - 23 mg/dL 39(H) 31(H) 34(H)  Creatinine 0.44 - 1.00 mg/dL 0.87 0.89 1.16(H)  Sodium 135 - 145 mmol/L 140 142 144  Potassium 3.5 - 5.1 mmol/L 4.2 3.5 3.7  Chloride 98 - 111 mmol/L 107 109 117(H)  CO2 22 - 32 mmol/L 27 27 22   Calcium 8.9 - 10.3 mg/dL 7.8(L) 8.1(L) 8.2(L)  Total Protein 6.5 - 8.1 g/dL 5.0(L) - -  Total Bilirubin 0.3 - 1.2 mg/dL 0.5 - -  Alkaline Phos 38 - 126 U/L 36(L) - -  AST 15 - 41 U/L 16 - -  ALT 0 - 44  U/L 9 - -    Imaging studies: No new pertinent imaging studies   Assessment/Plan:  87 y.o. adult with small bowel obstruction 4 Day Post-Op s/p exploratory laparotomy and lysis of adhesion, complicated by pertinent comorbidities including hypertension, dyslipidemia, gout and GERD.  Patient today without any clinical deterioration.  She continue with stable vital signs.  No fever.  There was a report of 1 episode of bowel movement.  I tried to climb the NGT.  No new episode of bowel movements.  Patient started having some cramps so NGT was placed back to suction.  We will continue with conservative management.  We will continue with TPN.  Happy to see that patient has some mobility done today.  Arnold Long, MD

## 2021-10-09 LAB — RENAL FUNCTION PANEL
Albumin: 2.3 g/dL — ABNORMAL LOW (ref 3.5–5.0)
Anion gap: 6 (ref 5–15)
BUN: 40 mg/dL — ABNORMAL HIGH (ref 8–23)
CO2: 25 mmol/L (ref 22–32)
Calcium: 8.2 mg/dL — ABNORMAL LOW (ref 8.9–10.3)
Chloride: 109 mmol/L (ref 98–111)
Creatinine, Ser: 0.74 mg/dL (ref 0.44–1.00)
GFR, Estimated: >60 mL/min (ref >60)
Glucose, Bld: 103 mg/dL — ABNORMAL HIGH (ref 70–99)
Phosphorus: 2.9 mg/dL (ref 2.5–4.6)
Potassium: 4.4 mmol/L (ref 3.5–5.1)
Sodium: 140 mmol/L (ref 135–145)

## 2021-10-09 LAB — MAGNESIUM: Magnesium: 1.6 mg/dL — ABNORMAL LOW (ref 1.7–2.4)

## 2021-10-09 MED ORDER — TRACE MINERALS CU-MN-SE-ZN 300-55-60-3000 MCG/ML IV SOLN
INTRAVENOUS | Status: AC
  Filled 2021-10-09: qty 496

## 2021-10-09 MED ORDER — MAGNESIUM SULFATE 2 GM/50ML IV SOLN
2.0000 g | Freq: Once | INTRAVENOUS | Status: AC
  Administered 2021-10-09: 2 g via INTRAVENOUS
  Filled 2021-10-09: qty 50

## 2021-10-09 NOTE — Progress Notes (Signed)
PROGRESS NOTE    Kathy Villanueva  SAY:[id number] DOB: 20-Jul-1924 DOA: 10/01/2021 PCP: Baxter Hire, MD    Brief Narrative:  Kathy Villanueva is a 87 year old female with past medical history significant for essential hypertension, dyslipidemia, gout, GERD, osteoarthritis who [facility] with generalized abdominal pain.  Progressing over the last 4-5 days with associated nausea and vomiting.  Patient also with diminished bowel movements.  Denies fever/chills, no chest pain or palpitations, no diarrhea.  In the ED, BP 145/69, otherwise vital signs within normal limits.  Labs revealed a BUN of 29 with a creatinine of 1.32.  CBC unrevealing.  Influenza A/B PCR negative.  Covid-19 PCR negative.  CT abdomen/pelvis with multiple dilated fluid-filled loops of small bowel with relatively decompressed distal small bowel and colon consistent with bowel obstruction; although discrete transition point difficult to visualize.  NG tube was placed.  General surgery was consulted.  TRH consulted for further evaluation and management of small bowel obstruction.   Assessment & Plan:   Active Problems:   SBO (small bowel obstruction) (HCC)   Protein-calorie malnutrition, severe   Small bowel obstruction Patient presenting to the ED with abdominal pain, abdominal distention with associated nausea/vomiting and decreased bowel movements.  CT abdomen/pelvis notable for multiple dilated fluid-filled loops of small bowel with relatively decompressed distal small bowel and colon consistent with bowel obstruction; although discrete transition point difficult to visualize.  Patient unfortunately failed conservative measures with NG tube and underwent exploratory laparotomy by general surgery on 10/04/2021 with enterolysis. --continue TPN, NGT currently clamped, CLD diet today per General surgery --Further per general surgery  Hypomagnesemia:  Magnesium 1.6 this morning; will replete -- Repeat  electrolytes in the a.m.  Essential hypertension At home on amlodipine and hydrochlorothiazide. --Holding oral antihypertensives given n.p.o.  --Hydralazine 10 mg IV q6h prn  CKD stage IIIb --Cr 1.32>1.21>1.63>1.40>1.16>0.89>0.87 --Continue IV fluids, on TPN, starting clear liquid diet per general surgery today as above  Vitamin B12 deficiency --Continue to hold oral vitamin B12 supplement while NPO  HLD: Not currently on statin outpatient  Severe protein calorie malnutrition Body mass index is 17.16 kg/m. Nutrition Status: Nutrition Problem: Severe Malnutrition Etiology: social / environmental circumstances Signs/Symptoms: severe fat depletion, severe muscle depletion Interventions: TPN --Dietitian following, continues NPO following surgery; started on TPN  Weakness/debility/deconditioning: --PT/OT currently recommending SNF. --Continue therapy efforts while inpatient --TOC following for SNF placement once medically stable    DVT prophylaxis: Place and maintain sequential compression device Start: 10/04/21 1307 enoxaparin (LOVENOX) injection 30 mg Start: 10/02/21 0800   Code Status: Full Code Family Communication: No family present at bedside this morning  Disposition Plan:  Level of care: Med-Surg Status is: Inpatient  Remains inpatient appropriate because: Continued small bowel obstruction s/p exploratory laparotomy with enterolysis, and now with postop ileus on TPN, will eventually need SNF placement when tolerating diet and off of TPN.     Consultants:  General surgery  Procedures:  NG tube placement Exploratory laparotomy with enterolysis, general surgery 10/27  Antimicrobials:  None   Subjective: Patient seen examined bedside, resting comfortably.  No family present.  No specific complaints this morning.  Seen by general surgery and NG tube clamped and starting clear liquid diet.  Remains on TPN.  No other questions or concerns at this time. Patient  denies headache, no fever, no nausea/vomiting/diarrhea, no chest pain, no shortness of breath, no palpitations.  No acute concerns overnight per nursing staff.  Will eventually need SNF placement.  Objective: Vitals:   10/08/21 1738 10/08/21 1932 10/09/21 0444 10/09/21 0855  BP: 111/70 137/66 (!) 156/77 121/64  Pulse: 85 80 77 78  Resp: 15 20 18    Temp: 98.4 F (36.9 C) 98.7 F (37.1 C) 97.9 F (36.6 C) 98.2 F (36.8 C)  TempSrc: Oral Oral Oral Oral  SpO2: 97% 98% 97% 99%  Weight:   45.4 kg   Height:        Intake/Output Summary (Last 24 hours) at 10/09/2021 1326 Last data filed at 10/09/2021 0400 Gross per 24 hour  Intake 1184.98 ml  Output 125 ml  Net 1059.98 ml   Filed Weights   10/07/21 0256 10/08/21 0438 10/09/21 0444  Weight: 40.8 kg 44.7 kg 45.4 kg    Examination:  General exam: Appears calm and comfortable, elderly/cachectic in appearance with muscle loss/fat wasting appreciated, NAD Respiratory system: Clear to auscultation. Respiratory effort normal.  On room air Cardiovascular system: S1 & S2 heard, RRR. No JVD, murmurs, rubs, gallops or clicks. No pedal edema. Gastrointestinal system: Abdomen is distended, soft with mild tenderness, abdominal incision noted with staples in place, No organomegaly or masses felt.  No appreciable bowel sounds this morning. Central nervous system: Alert and oriented. No focal neurological deficits. Extremities: Symmetric 5 x 5 power. Skin: No rashes, lesions or ulcers Psychiatry: Judgement and insight appear normal. Mood & affect appropriate.     Data Reviewed: I have personally reviewed following labs and imaging studies  CBC: Recent Labs  Lab 10/05/21 0554 10/06/21 0444  WBC 8.0 8.4  HGB 11.7* 11.1*  HCT 37.1 34.0*  MCV 94.2 91.6  PLT 190 854   Basic Metabolic Panel: Recent Labs  Lab 10/05/21 0554 10/06/21 0444 10/07/21 0356 10/08/21 0442 10/09/21 0529  NA 144 144 142 140 140  K 3.7 3.7 3.5 4.2 4.4  CL 114*  117* 109 107 109  CO2 19* 22 27 27 25   GLUCOSE 97 145* 120* 112* 103*  BUN 40* 34* 31* 39* 40*  CREATININE 1.40* 1.16* 0.89 0.87 0.74  CALCIUM 7.8* 8.2* 8.1* 7.8* 8.2*  MG 1.9 1.9 1.7 1.9 1.6*  PHOS 4.0 2.0* 2.0* 3.7 2.9   GFR: Estimated Creatinine Clearance (by C-G formula based on SCr of 0.74 mg/dL) Female: 30.1 mL/min Female: 35.5 mL/min Liver Function Tests: Recent Labs  Lab 10/05/21 0554 10/06/21 0444 10/08/21 0442 10/09/21 0529  AST 19  --  16  --   ALT 10  --  9  --   ALKPHOS 28*  --  36*  --   BILITOT 1.1  --  0.5  --   PROT 5.6*  --  5.0*  --   ALBUMIN 2.8* 2.6* 2.3* 2.3*   No results for input(s): LIPASE, AMYLASE in the last 168 hours.  No results for input(s): AMMONIA in the last 168 hours. Coagulation Profile: No results for input(s): INR, PROTIME in the last 168 hours. Cardiac Enzymes: No results for input(s): CKTOTAL, CKMB, CKMBINDEX, TROPONINI in the last 168 hours. BNP (last 3 results) No results for input(s): PROBNP in the last 8760 hours. HbA1C: No results for input(s): HGBA1C in the last 72 hours. CBG: Recent Labs  Lab 10/07/21 0509 10/07/21 1157 10/07/21 1807 10/08/21 0148 10/08/21 0537  GLUCAP 124* 106* 122* 118* 107*   Lipid Profile: Recent Labs    10/08/21 0442  TRIG 66   Thyroid Function Tests: No results for input(s): TSH, T4TOTAL, FREET4, T3FREE, THYROIDAB in the last 72 hours. Anemia Panel: No results for  input(s): VITAMINB12, FOLATE, FERRITIN, TIBC, IRON, RETICCTPCT in the last 72 hours. Sepsis Labs: No results for input(s): PROCALCITON, LATICACIDVEN in the last 168 hours.  Recent Results (from the past 240 hour(s))  Resp Panel by RT-PCR (Flu A&B, Covid) Nasopharyngeal Swab     Status: None   Collection Time: 10/01/21  3:52 PM   Specimen: Nasopharyngeal Swab; Nasopharyngeal(NP) swabs in vial transport medium  Result Value Ref Range Status   SARS Coronavirus 2 by RT PCR NEGATIVE NEGATIVE Final    Comment: (NOTE) SARS-CoV-2  target nucleic acids are NOT DETECTED.  The SARS-CoV-2 RNA is generally detectable in upper respiratory specimens during the acute phase of infection. The lowest concentration of SARS-CoV-2 viral copies this assay can detect is 138 copies/mL. A negative result does not preclude SARS-Cov-2 infection and should not be used as the sole basis for treatment or other patient management decisions. A negative result may occur with  improper specimen collection/handling, submission of specimen other than nasopharyngeal swab, presence of viral mutation(s) within the areas targeted by this assay, and inadequate number of viral copies(<138 copies/mL). A negative result must be combined with clinical observations, patient history, and epidemiological information. The expected result is Negative.  Fact Sheet for Patients:  EntrepreneurPulse.com.au  Fact Sheet for Healthcare Providers:  IncredibleEmployment.be  This test is no t yet approved or cleared by the [place] FDA and  has been authorized for detection and/or diagnosis of SARS-CoV-2 by FDA under an Emergency Use Authorization (EUA). This EUA will remain  in effect (meaning this test can be used) for the duration of the COVID-19 declaration under Section 564(b)(1) of the Act, 21 U.S.C.section 360bbb-3(b)(1), unless the authorization is terminated  or revoked sooner.       Influenza A by PCR NEGATIVE NEGATIVE Final   Influenza B by PCR NEGATIVE NEGATIVE Final    Comment: (NOTE) The Xpert Xpress SARS-CoV-2/FLU/RSV plus assay is intended as an aid in the diagnosis of influenza from Nasopharyngeal swab specimens and should not be used as a sole basis for treatment. Nasal washings and aspirates are unacceptable for Xpert Xpress SARS-CoV-2/FLU/RSV testing.  Fact Sheet for Patients: EntrepreneurPulse.com.au  Fact Sheet for Healthcare  Providers: IncredibleEmployment.be  This test is not yet approved or cleared by the [place] FDA and has been authorized for detection and/or diagnosis of SARS-CoV-2 by FDA under an Emergency Use Authorization (EUA). This EUA will remain in effect (meaning this test can be used) for the duration of the COVID-19 declaration under Section 564(b)(1) of the Act, 21 U.S.C. section 360bbb-3(b)(1), unless the authorization is terminated or revoked.  Performed at [facility], [address], [place], [place] [address]          Radiology Studies: No results found.      Scheduled Meds:  Chlorhexidine Gluconate Cloth  6 each Topical Daily   enoxaparin (LOVENOX) injection  30 mg Subcutaneous Q24H   pantoprazole (PROTONIX) IV  40 mg Intravenous Q12H   sodium chloride flush  10-40 mL Intracatheter Q12H   Continuous Infusions:  TPN ADULT (ION) 50 mL/hr at 10/09/21 0321   TPN ADULT (ION)       LOS: 8 days    Time spent: 38 minutes spent on chart review, discussion with nursing staff, consultants, updating family and interview/physical exam; more than 50% of that time was spent in counseling and/or coordination of care.    Jacquelyne Quarry J [place] DO [facility] Available via Epic secure chat 7am-7pm After these hours, please refer  to coverage provider listed on [email or web address] 10/09/2021, 1:26 PM

## 2021-10-09 NOTE — Progress Notes (Signed)
Mobility Specialist - Progress Note   10/09/21 1100  Mobility  Activity Dangled on edge of bed;Sat and stood x 3  Level of Assistance Moderate assist, patient does 50-74%  Assistive Device Front wheel walker  Distance Ambulated (ft) 0 ft  Mobility Sit up in bed/chair position for meals  Mobility Response Tolerated well  Mobility performed by Mobility specialist  $Mobility charge 1 Mobility    Pt lying in bed upon arrival, utilizing RA. Initially denies any complaints, but does voice mild nausea upon sitting. ModA to achieve EOB. Pt stood to RW with min-modA for balance x2. Noted posterior lean upon standing with pt utilizing bed for LE support. Pt returned to EOB and participated in seated therex---pt states she is unable to perform seated marching this date, no difficulty with remainder of tasks. Pt returned to bed with alarm set.    Kathee Delton Mobility Specialist 10/09/21, 11:43 AM

## 2021-10-09 NOTE — Progress Notes (Signed)
PHARMACY - TOTAL PARENTERAL NUTRITION CONSULT NOTE   Indication: Small bowel obstruction  Patient Measurements: Height: 5\' 4"  (162.6 cm) Weight: 45.4 kg (100 lb) IBW/kg (Calculated) : 54.7 TPN AdjBW (KG): 42.8 Body mass index is 17.16 kg/m.  Assessment: 87 y/o female with h/o GERD, HTN, CKD 3b, B12 deficiency, gout and COVID 19 (may 2022) who is admitted with SBO.   Glucose / Insulin: serum BG 103, stable, not  requiring insulin / non-diabetic Electrolytes: WNL today Renal: SCr 1.32-->0.74 Hepatic: LFT, TG wnl at baseline Intake / Output; UOP not quantified MIVF: no MIVF GI Imaging:  10/27 abdominal x-ray shows persistent small bowel dilation without improvement. GI Surgeries / Procedures:  10/27: Exploratory laparotomy with enterolysis  Central access: 10/05/21 TPN start date: 10/05/21   Nutritional Goals: Goal TPN rate is 50 mL/hr (provides 74.4 g of protein and 1518.7 kcals per day)  RD Assessment: Estimated Needs Total Energy Estimated Needs: 1350-1550 Total Protein Estimated Needs: 65-80 grams Total Fluid Estimated Needs: 1.3 L  Current Nutrition: CLD started 11/1  Plan:  Continue TPN at goal rate of 50 mL/hr (total volume 1300 mL total including overfill) Nutritional components Amino acids (using 15% Clinisol): 74.4 grams Dextrose: 226.8 grams Lipids (using 20% SMOFlipid): 45 grams Electrolytes in TPN: Na 64mEq/L, K 47mEq/L (reduced 11/1), Ca 40mEq/L, Mg 62mEq/L, and Phos 40mmol/L. Cl:Ac 1:1 Add standard MVI and trace elements to TPN 2 grams IV magnesium sulfate x 1  Monitor TPN labs on Mon/Thurs, daily until stable  Dallie Piles 10/09/2021,7:21 AM

## 2021-10-09 NOTE — Plan of Care (Signed)
  Problem: Clinical Measurements: Goal: Ability to maintain clinical measurements within normal limits will improve Outcome: Progressing Goal: Will remain free from infection Outcome: Progressing Goal: Diagnostic test results will improve Outcome: Progressing Goal: Respiratory complications will improve Outcome: Progressing Goal: Cardiovascular complication will be avoided Outcome: Progressing   Problem: Pain Managment: Goal: General experience of comfort will improve Outcome: Progressing   Pt is involved in and agrees with the plan of care. V/S stable except Sbp-150s. Complained of cramping abdominal pain but refused pain meds. BM noted this shift. NGT in place - drained 125 ml of dark brown.

## 2021-10-09 NOTE — Progress Notes (Signed)
Patient ID: Kathy Villanueva, adult   DOB: 1924-03-02, 87 y.o.   MRN: [id number]     [facility] Day(s): 8.   Interval History: Patient seen and examined, no acute events or new complaints overnight. Patient reports feeling OK. Denies abdominal pain. Cannot recall passing gas or bowel movement.  As per chart review patient had a medium sized bowel movement.  Vital signs in last 24 hours: [min-max] current  Temp:  [97.9 F (36.6 C)-98.7 F (37.1 C)] 97.9 F (36.6 C) (11/01 0444) Pulse Rate:  [71-85] 77 (11/01 0444) Resp:  [15-20] 18 (11/01 0444) BP: (111-156)/(66-84) 156/77 (11/01 0444) SpO2:  [97 %-99 %] 97 % (11/01 0444) Weight:  [45.4 kg] 45.4 kg (11/01 0444)     Height: 5\' 4"  (162.6 cm) Weight: 45.4 kg BMI (Calculated): 17.16   Physical Exam:  Constitutional: alert, cooperative and no distress  Respiratory: breathing non-labored at rest  Cardiovascular: regular rate and sinus rhythm  Gastrointestinal: soft, non-tender, and non-distended.  Wound is dry and clean  Labs:  CBC Latest Ref Rng & Units 10/06/2021 10/05/2021 10/02/2021  WBC 4.0 - 10.5 K/uL 8.4 8.0 6.7  Hemoglobin 12.0 - 15.0 g/dL 11.1(L) 11.7(L) 12.2  Hematocrit 36.0 - 46.0 % 34.0(L) 37.1 36.8  Platelets 150 - 400 K/uL 156 190 205   CMP Latest Ref Rng & Units 10/09/2021 10/08/2021 10/07/2021  Glucose 70 - 99 mg/dL 103(H) 112(H) 120(H)  BUN 8 - 23 mg/dL 40(H) 39(H) 31(H)  Creatinine 0.44 - 1.00 mg/dL 0.74 0.87 0.89  Sodium 135 - 145 mmol/L 140 140 142  Potassium 3.5 - 5.1 mmol/L 4.4 4.2 3.5  Chloride 98 - 111 mmol/L 109 107 109  CO2 22 - 32 mmol/L 25 27 27   Calcium 8.9 - 10.3 mg/dL 8.2(L) 7.8(L) 8.1(L)  Total Protein 6.5 - 8.1 g/dL - 5.0(L) -  Total Bilirubin 0.3 - 1.2 mg/dL - 0.5 -  Alkaline Phos 38 - 126 U/L - 36(L) -  AST 15 - 41 U/L - 16 -  ALT 0 - 44 U/L - 9 -    Imaging studies: No new pertinent imaging studies   Assessment/Plan:  87 y.o. adult with small bowel obstruction 4 Day  Post-Op s/p exploratory laparotomy and lysis of adhesion, complicated by pertinent comorbidities including hypertension, dyslipidemia, gout and GERD.  Patient continued to be stable.  As per chart review patient had a medium sized bowel movement.  We will try clamp trial with clear liquids.  We will assess for toleration.  Encourage the patient to continue physical therapy to get out of bed.  No sign of complication at the moment.  I will continue to follow closely.  Arnold Long, MD

## 2021-10-10 LAB — BASIC METABOLIC PANEL
Anion gap: 6 (ref 5–15)
BUN: 43 mg/dL — ABNORMAL HIGH (ref 8–23)
CO2: 26 mmol/L (ref 22–32)
Calcium: 8 mg/dL — ABNORMAL LOW (ref 8.9–10.3)
Chloride: 103 mmol/L (ref 98–111)
Creatinine, Ser: 0.84 mg/dL (ref 0.44–1.00)
GFR, Estimated: >60 mL/min (ref >60)
Glucose, Bld: 102 mg/dL — ABNORMAL HIGH (ref 70–99)
Potassium: 4.4 mmol/L (ref 3.5–5.1)
Sodium: 135 mmol/L (ref 135–145)

## 2021-10-10 LAB — MAGNESIUM: Magnesium: 1.9 mg/dL (ref 1.7–2.4)

## 2021-10-10 MED ORDER — TRACE MINERALS CU-MN-SE-ZN 300-55-60-3000 MCG/ML IV SOLN
INTRAVENOUS | Status: AC
  Filled 2021-10-10: qty 496

## 2021-10-10 NOTE — Progress Notes (Signed)
PHARMACY - TOTAL PARENTERAL NUTRITION CONSULT NOTE   Indication: Small bowel obstruction  Patient Measurements: Height: 5\' 4"  (162.6 cm) Weight: 42.9 kg (94 lb 8 oz) IBW/kg (Calculated) : 54.7 TPN AdjBW (KG): 42.8 Body mass index is 16.22 kg/m.  Assessment: 87 y/o female with h/o GERD, HTN, CKD 3b, B12 deficiency, gout and COVID 19 (may 2022) who is admitted with SBO.   Glucose / Insulin: serum BG 102, stable, not  requiring insulin / non-diabetic Electrolytes: WNL today Renal: SCr 1.32-->0.74 Hepatic: LFT, TG wnl at baseline Intake / Output; UOP not quantified MIVF: no MIVF GI Imaging:  10/27 abdominal x-ray shows persistent small bowel dilation without improvement. GI Surgeries / Procedures:  10/27: Exploratory laparotomy with enterolysis  Central access: 10/05/21 TPN start date: 10/05/21   Nutritional Goals: Goal TPN rate is 50 mL/hr (provides 74.4 g of protein and 1518.7 kcals per day)  RD Assessment: Estimated Needs Total Energy Estimated Needs: 1350-1550 Total Protein Estimated Needs: 65-80 grams Total Fluid Estimated Needs: 1.3 L  Current Nutrition: advanced to FLD 11/2  Plan:  Continue TPN at goal rate of 50 mL/hr (total volume 1300 mL total including overfill) Nutritional components Amino acids (using 15% Clinisol): 74.4 grams Dextrose: 226.8 grams Lipids (using 20% SMOFlipid): 45 grams Electrolytes in TPN: Na 31mEq/L, K 66mEq/L (reduced 11/1), Ca 63mEq/L, Mg 59mEq/L, and Phos 97mmol/L. Cl:Ac 1:1 Add standard MVI and trace elements to TPN Monitor TPN labs on Mon/Thurs, daily until stable  Dallie Piles 10/10/2021,7:30 AM

## 2021-10-10 NOTE — TOC Progression Note (Signed)
Transition of Care [facility]) - Progression Note    Patient Details  Name: Kathy Villanueva MRN: [id number] Date of Birth: 06-Oct-1924  Transition of Care [facility]) CM/SW [place], RN Phone Number: 10/10/2021, 2:34 PM  Clinical Narrative: Informed by Attending that family decided to for patient to be discharged home with [facility] Services. Called and spoke with daughter Gwenith Daily who says patient is currently receiving [facility] 4 hours a day, however she will call to have hours increased. Daughter can not remember name of agency, will call me back with the information.     Expected Discharge Plan: [place] Barriers to Discharge: Continued Medical Work up  Expected Discharge Plan and Services Expected Discharge Plan: [place] Acute Care Choice:  (TBD) Living arrangements for the past 2 months: Apartment                                       Social Determinants of Health (SDOH) Interventions    Readmission Risk Interventions No flowsheet data found.

## 2021-10-10 NOTE — Progress Notes (Signed)
Physical Therapy Treatment Patient Details Name: Kathy Villanueva MRN: [id number] DOB: 04/16/1924 Today's Date: 10/10/2021   History of Present Illness presented to ER secondary to abdominal pain, vomiting; admitted for management of SBO (likely due to adhesions).  Initial recommendation for conservative measures; monitoring daily for status and need for surgical intervention. Now, pt is s/p Lapartomy, currently POD 4 at time of re-evaluation.    PT Comments    Pt received sitting up in bed and requesting to move to the chair. Pt completed supine exercises with verbal and tactile cues for proper form and technique. Pt requiring verbal cues for moving from supine to sitting EOB. Pt requesting to use BSC and transferred with minA throughout for safety. Attempted to transfer from [facility] to recliner chair but requiring seated rest break on edge of bed prior to transferring to recliner chair. Pt often attempts to sit unexpectedly and deferred further ambulation due to increased risk of falls. Pt continues to be a high falls risk and will benefit from further skilled PT services to improve overall activity tolerance and mobility.    Recommendations for follow up therapy are one component of a multi-disciplinary discharge planning process, led by the attending physician.  Recommendations may be updated based on patient status, additional functional criteria and insurance authorization.  Follow Up Recommendations  Skilled nursing-short term rehab (<3 hours/day)     Assistance Recommended at Discharge Frequent or constant Supervision/Assistance  Equipment Recommendations  Other (comment) (TBD next venue of care)    Recommendations for Other Services       Precautions / Restrictions Precautions Precautions: Fall Restrictions Weight Bearing Restrictions: No     Mobility  Bed Mobility Overal bed mobility: Needs Assistance Bed Mobility: Supine to Sit     Supine to sit: Min guard;HOB elevated      General bed mobility comments: Verbal cues for sequencing and hand placement to improve ability to perform bed mobility.    Transfers Overall transfer level: Needs assistance Equipment used: Rolling walker (2 wheels) Transfers: Sit to/from [facility] Sit to Stand: Min assist;From elevated surface Stand pivot transfers: Mod assist         General transfer comment: Pt requesting transfering to [facility] and requiring minA from raised surface. Pt able to turn and take steps towards [facility] however, requesting to sit unexpectedly.    Ambulation/Gait             General Gait Details: Decferred due for safety concerns   Stairs             Wheelchair Mobility    Modified Rankin (Stroke Patients Only)       Balance Overall balance assessment: Needs assistance Sitting-balance support: Feet supported;Bilateral upper extremity supported Sitting balance-Leahy Scale: Fair     Standing balance support: Bilateral upper extremity supported;During functional activity;Reliant on assistive device for balance Standing balance-Leahy Scale: Poor Standing balance comment: Pt remains in forward flexed posture and B knee flexion throughout transfers. Pt sits unexpectedly and has difficulty with maintaining standing balance without support                            Cognition Arousal/Alertness: Awake/alert Behavior During Therapy: Flat affect Overall Cognitive Status: Within Functional Limits for tasks assessed                                 General Comments: Pt  is hard of hearing and requires additional time for responding        Exercises Total Joint Exercises Ankle Circles/Pumps: AROM;Strengthening;Both;10 reps Short Arc Quad: AROM;Strengthening;Both;10 reps;Supine Hip ABduction/ADduction: AROM;Strengthening;Both;10 reps;Supine Straight Leg Raises: AROM;Strengthening;Both;10 reps;Supine Long Arc Quad: AROM;Strengthening;Both;10  reps;Seated Other Exercises Other Exercises: Pt required totalA with pericare in standing with B UE support. Pt completed stand pivot transfer from [facility] back to bed and then again from bed to recliner chair with minA both trials.    General Comments        Pertinent Vitals/Pain Pain Assessment: No/denies pain    Home Living                          Prior Function            PT Goals (current goals can now be found in the care plan section) Acute Rehab PT Goals Patient Stated Goal: per daughter in law, to maintain strength as able PT Goal Formulation: With patient Time For Goal Achievement: 10/17/21 Potential to Achieve Goals: Good Progress towards PT goals: Progressing toward goals    Frequency    Min 2X/week      PT Plan Current plan remains appropriate    Co-evaluation              AM-PAC PT "6 Clicks" Mobility   Outcome Measure  Help needed turning from your back to your side while in a flat bed without using bedrails?: A Little Help needed moving from lying on your back to sitting on the side of a flat bed without using bedrails?: A Little Help needed moving to and from a bed to a chair (including a wheelchair)?: A Lot Help needed standing up from a chair using your arms (e.g., wheelchair or bedside chair)?: A Little Help needed to walk in hospital room?: Total Help needed climbing 3-5 steps with a railing? : Total 6 Click Score: 13    End of Session Equipment Utilized During Treatment: Gait belt Activity Tolerance: Patient tolerated treatment well;Patient limited by fatigue Patient left: in chair;with call bell/phone within reach;with chair alarm set Nurse Communication: Mobility status PT Visit Diagnosis: Muscle weakness (generalized) (M62.81);Difficulty in walking, not elsewhere classified (R26.2);Pain     Time: 1561-5379 PT Time Calculation (min) (ACUTE ONLY): 23 min  Charges:  $Therapeutic Exercise: 8-22 mins $Therapeutic Activity:  8-22 mins                     [name], SPT    [name] 10/10/2021, 12:26 PM

## 2021-10-10 NOTE — Progress Notes (Signed)
PROGRESS NOTE    Kathy Villanueva  LPF:[id number] DOB: [birth year] DOA: 10/01/2021 PCP: Baxter Hire, MD    Brief Narrative:  Kathy Villanueva is a 87 year old female with past medical history significant for essential hypertension, dyslipidemia, gout, GERD, osteoarthritis who presented to [facility] ED on 10/24 with generalized abdominal pain.  Progressing over the last 4-5 days with associated nausea and vomiting.  Patient also with diminished bowel movements.  Denies fever/chills, no chest pain or palpitations, no diarrhea.   In the ED, BP 145/69, otherwise vital signs within normal limits.  Labs revealed a BUN of 29 with a creatinine of 1.32.  CBC unrevealing.  Influenza A/B PCR negative.  Covid-19 PCR negative.  CT abdomen/pelvis with multiple dilated fluid-filled loops of small bowel with relatively decompressed distal small bowel and colon consistent with bowel obstruction; although discrete transition point difficult to visualize.  NG tube was placed.  General surgery was consulted.  TRH consulted for further evaluation and management of small bowel obstruction.   Assessment & Plan:   Active Problems:   SBO (small bowel obstruction) (HCC)   Protein-calorie malnutrition, severe  Small bowel obstruction Failed conservative management.  Underwent ex lap on 10/27. Treated with TPN.  Now with return of bowel function.  On full liquid diet today. Continue mobility.  Patient is very picky eater.  Encourage eating.  May stop TPN tomorrow.   Hypomagnesemia:  Repleted and improved.   Essential hypertension At home on amlodipine and hydrochlorothiazide. --Holding oral antihypertensives given n.p.o.  --Hydralazine 10 mg IV q6h prn, blood pressure still low normal.  No indication to restart blood pressure medications today.   AKI on CKD stage IIIb --Cr 1.32>1.21>1.63>1.40>1.16>0.89>0.87 --Continue IV fluids, on TPN, advancing to full liquid diet.   Severe protein calorie malnutrition Body mass  index is 17.16 kg/m. Nutrition Status: Nutrition Problem: Severe Malnutrition Etiology: social / environmental circumstances Signs/Symptoms: severe fat depletion, severe muscle depletion Interventions: TPN --Dietitian following, continues NPO following surgery; started on TPN   Weakness/debility/deconditioning: --PT/OT currently recommending SNF. --Continue therapy efforts while inpatient --Patient and family prefer her to go home and wants to maximize home therapies.  Continue to work with therapies while in the hospital.     DVT prophylaxis: Place and maintain sequential compression device Start: 10/04/21 1307 enoxaparin (LOVENOX) injection 30 mg Start: 10/02/21 0800   Code Status: Full code Family Communication: Daughter on the phone Disposition Plan: Status is: Inpatient  Remains inpatient appropriate because: Awaiting bowel function.         Consultants:  General surgery  Procedures:  Ex lap 10/27  Antimicrobials:  None   Subjective: Patient seen and examined.  Poor historian.  Denies any complaints.  She tells me she lives alone and she can get around without much support.  She does use walker outside home.  She does not eat a lot anyway.  Does not have any appetite.  Denies any nausea vomiting or abdominal pain. Objective: Vitals:   10/09/21 2039 10/10/21 0445 10/10/21 0530 10/10/21 1005  BP: 140/71 133/90  109/63  Pulse: 75 76  74  Resp: 20 18    Temp: 98.2 F (36.8 C) 97.9 F (36.6 C)  97.7 F (36.5 C)  TempSrc: Oral Oral  Oral  SpO2: 100% 99%  98%  Weight:   42.9 kg   Height:        Intake/Output Summary (Last 24 hours) at 10/10/2021 1411 Last data filed at 10/10/2021 0900 Gross per 24 hour  Intake 1487.16 ml  Output 175 ml  Net 1312.16 ml   Filed Weights   10/08/21 0438 10/09/21 0444 10/10/21 0530  Weight: 44.7 kg 45.4 kg 42.9 kg    Examination:  General exam: Frail and debilitated.  Thin looking.  Cachectic. Respiratory system:  Clear to auscultation. Respiratory effort normal. Cardiovascular system: S1 & S2 heard, RRR. Gastrointestinal system: Soft.  Minimal incisional tenderness of the midline incision.  Bowel sounds present.  Incisions clean and dry.  Staples intact. Central nervous system: Alert and oriented. No focal neurological deficits.  Generalized weakness.    Data Reviewed: I have personally reviewed following labs and imaging studies  CBC: Recent Labs  Lab 10/05/21 0554 10/06/21 0444  WBC 8.0 8.4  HGB 11.7* 11.1*  HCT 37.1 34.0*  MCV 94.2 91.6  PLT 190 790   Basic Metabolic Panel: Recent Labs  Lab 10/05/21 0554 10/06/21 0444 10/07/21 0356 10/08/21 0442 10/09/21 0529 10/10/21 0520  NA 144 144 142 140 140 135  K 3.7 3.7 3.5 4.2 4.4 4.4  CL 114* 117* 109 107 109 103  CO2 19* 22 27 27 25 26   GLUCOSE 97 145* 120* 112* 103* 102*  BUN 40* 34* 31* 39* 40* 43*  CREATININE 1.40* 1.16* 0.89 0.87 0.74 0.84  CALCIUM 7.8* 8.2* 8.1* 7.8* 8.2* 8.0*  MG 1.9 1.9 1.7 1.9 1.6* 1.9  PHOS 4.0 2.0* 2.0* 3.7 2.9  --    GFR: Estimated Creatinine Clearance (by C-G formula based on SCr of 0.84 mg/dL) Female: 27.1 mL/min Female: 31.9 mL/min Liver Function Tests: Recent Labs  Lab 10/05/21 0554 10/06/21 0444 10/08/21 0442 10/09/21 0529  AST 19  --  16  --   ALT 10  --  9  --   ALKPHOS 28*  --  36*  --   BILITOT 1.1  --  0.5  --   PROT 5.6*  --  5.0*  --   ALBUMIN 2.8* 2.6* 2.3* 2.3*   No results for input(s): LIPASE, AMYLASE in the last 168 hours. No results for input(s): AMMONIA in the last 168 hours. Coagulation Profile: No results for input(s): INR, PROTIME in the last 168 hours. Cardiac Enzymes: No results for input(s): CKTOTAL, CKMB, CKMBINDEX, TROPONINI in the last 168 hours. BNP (last 3 results) No results for input(s): PROBNP in the last 8760 hours. HbA1C: No results for input(s): HGBA1C in the last 72 hours. CBG: Recent Labs  Lab 10/07/21 0509 10/07/21 1157 10/07/21 1807  10/08/21 0148 10/08/21 0537  GLUCAP 124* 106* 122* 118* 107*   Lipid Profile: Recent Labs    10/08/21 0442  TRIG 66   Thyroid Function Tests: No results for input(s): TSH, T4TOTAL, FREET4, T3FREE, THYROIDAB in the last 72 hours. Anemia Panel: No results for input(s): VITAMINB12, FOLATE, FERRITIN, TIBC, IRON, RETICCTPCT in the last 72 hours. Sepsis Labs: No results for input(s): PROCALCITON, LATICACIDVEN in the last 168 hours.  Recent Results (from the past 240 hour(s))  Resp Panel by RT-PCR (Flu A&B, Covid) Nasopharyngeal Swab     Status: None   Collection Time: 10/01/21  3:52 PM   Specimen: Nasopharyngeal Swab; Nasopharyngeal(NP) swabs in vial transport medium  Result Value Ref Range Status   SARS Coronavirus 2 by RT PCR NEGATIVE NEGATIVE Final    Comment: (NOTE) SARS-CoV-2 target nucleic acids are NOT DETECTED.  The SARS-CoV-2 RNA is generally detectable in upper respiratory specimens during the acute phase of infection. The lowest concentration of SARS-CoV-2 viral copies this assay can detect is 138 copies/mL. A negative  result does not preclude SARS-Cov-2 infection and should not be used as the sole basis for treatment or other patient management decisions. A negative result may occur with  improper specimen collection/handling, submission of specimen other than nasopharyngeal swab, presence of viral mutation(s) within the areas targeted by this assay, and inadequate number of viral copies(<138 copies/mL). A negative result must be combined with clinical observations, patient history, and epidemiological information. The expected result is Negative.  Fact Sheet for Patients:  EntrepreneurPulse.com.au  Fact Sheet for Healthcare Providers:  IncredibleEmployment.be  This test is no t yet approved or cleared by the [place] FDA and  has been authorized for detection and/or diagnosis of SARS-CoV-2 by FDA under an Emergency Use  Authorization (EUA). This EUA will remain  in effect (meaning this test can be used) for the duration of the COVID-19 declaration under Section 564(b)(1) of the Act, 21 U.S.C.section 360bbb-3(b)(1), unless the authorization is terminated  or revoked sooner.       Influenza A by PCR NEGATIVE NEGATIVE Final   Influenza B by PCR NEGATIVE NEGATIVE Final    Comment: (NOTE) The Xpert Xpress SARS-CoV-2/FLU/RSV plus assay is intended as an aid in the diagnosis of influenza from Nasopharyngeal swab specimens and should not be used as a sole basis for treatment. Nasal washings and aspirates are unacceptable for Xpert Xpress SARS-CoV-2/FLU/RSV testing.  Fact Sheet for Patients: EntrepreneurPulse.com.au  Fact Sheet for Healthcare Providers: IncredibleEmployment.be  This test is not yet approved or cleared by the [place] FDA and has been authorized for detection and/or diagnosis of SARS-CoV-2 by FDA under an Emergency Use Authorization (EUA). This EUA will remain in effect (meaning this test can be used) for the duration of the COVID-19 declaration under Section 564(b)(1) of the Act, 21 U.S.C. section 360bbb-3(b)(1), unless the authorization is terminated or revoked.  Performed at [facility], [address] [place], [place] [address]          Radiology Studies: No results found.      Scheduled Meds:  Chlorhexidine Gluconate Cloth  6 each Topical Daily   enoxaparin (LOVENOX) injection  30 mg Subcutaneous Q24H   pantoprazole (PROTONIX) IV  40 mg Intravenous Q12H   sodium chloride flush  10-40 mL Intracatheter Q12H   Continuous Infusions:  TPN ADULT (ION) 50 mL/hr at 10/10/21 0500   TPN ADULT (ION)       LOS: 9 days    Time spent: 32 minutes    Barb Merino, MD [facility] Pager [phone]

## 2021-10-10 NOTE — Progress Notes (Signed)
Mobility Specialist - Progress Note   10/10/21 1100  Mobility  Activity Transferred:  Chair to bed  Level of Assistance Minimal assist, patient does 75% or more  Assistive Device Front wheel walker  Distance Ambulated (ft) 2 ft  Mobility Sit up in bed/chair position for meals  Mobility Response Tolerated well  Mobility performed by Mobility specialist  $Mobility charge 1 Mobility    Pt transferred bed-chair. +1 to reposition to [facility].   Kathee Delton Mobility Specialist 10/10/21, 3:29 PM

## 2021-10-10 NOTE — Progress Notes (Signed)
Occupational Therapy Treatment Patient Details Name: Kathy Villanueva MRN: [id number] DOB: November 08, 1924 Today's Date: 10/10/2021   History of present illness presented to ER secondary to abdominal pain, vomiting; admitted for management of SBO (likely due to adhesions).  Initial recommendation for conservative measures; monitoring daily for status and need for surgical intervention. Now, pt is s/p Lapartomy, currently POD 4 at time of re-evaluation.   OT comments  Chart reviewed, pt greeted in bed, agreeable to tx session however reporting fatigue. Tx session targeted progressing strength/endurance in the setting of ADL completion to facilitate return to PLOF. Pt is progressing towards goal acquisition. Of note pt with decreased tolerance seated at EOB at this time, pt reporting sitting up in the chair previous to tx session. Pt is left as received, NAD, all needs met. RN and NT notified of pt status. OT will continue to follow while admitted.    Recommendations for follow up therapy are one component of a multi-disciplinary discharge planning process, led by the attending physician.  Recommendations may be updated based on patient status, additional functional criteria and insurance authorization.    Follow Up Recommendations  Skilled nursing-short term rehab (<3 hours/day)    Assistance Recommended at Discharge Frequent or constant Supervision/Assistance  Equipment Recommendations  BSC;Tub/shower seat    Recommendations for Other Services      Precautions / Restrictions Precautions Precautions: Fall       Mobility Bed Mobility Overal bed mobility: Needs Assistance Bed Mobility: Supine to Sit;Sit to Supine     Supine to sit: Min assist;HOB elevated Sit to supine: Min assist;HOB elevated        Transfers                   General transfer comment: declines further mobility on this date reporting fatigue     Balance Overall balance assessment: Needs  assistance Sitting-balance support: Feet supported Sitting balance-Leahy Scale: Fair Sitting balance - Comments: posterior lean after no more than 5 minutes; corrected with MIN A Postural control: Posterior lean                                 ADL either performed or assessed with clinical judgement   ADL                                         General ADL Comments: MIN A for grooming tasks at edge of bed, SUP for doffing socks, MAX A for donning socks      Cognition Arousal/Alertness: Awake/alert Behavior During Therapy: Flat affect Overall Cognitive Status: Within Functional Limits for tasks assessed                                 General Comments: HOH                     Pertinent Vitals/ Pain       Pain Assessment: No/denies pain   Progress Toward Goals  OT Goals(current goals can now be found in the care plan section)  Progress towards OT goals: Progressing toward goals     Plan Discharge plan remains appropriate;Frequency remains appropriate    Co-evaluation  AM-PAC OT "6 Clicks" Daily Activity     Outcome Measure   Help from another person eating meals?: A Lot Help from another person taking care of personal grooming?: A Lot Help from another person toileting, which includes using toliet, bedpan, or urinal?: A Lot Help from another person bathing (including washing, rinsing, drying)?: A Lot Help from another person to put on and taking off regular upper body clothing?: A Little Help from another person to put on and taking off regular lower body clothing?: A Lot 6 Click Score: 13    End of Session    OT Visit Diagnosis: Muscle weakness (generalized) (M62.81)   Activity Tolerance Patient limited by fatigue   Patient Left in bed;with call bell/phone within reach;with bed alarm set   Nurse Communication Mobility status        Time: 3749-6646 OT Time Calculation (min): 21  min  Charges: OT General Charges $OT Visit: 1 Visit OT Treatments $Self Care/Home Management : 8-22 mins  Shanon Payor, OTD OTR/L  10/10/21, 3:49 PM

## 2021-10-10 NOTE — Progress Notes (Signed)
Patient ID: Kathy Villanueva, adult   DOB: 1924-07-14, 87 y.o.   MRN: [id number]     [facility] Day(s): 9.   Interval History: Patient seen and examined, no acute events or new complaints overnight. Patient reports feeling well.  She denies any nausea or vomiting.  NGT has been clamped for 24 hours.  She has been having multiple bowel movement.  Vital signs in last 24 hours: [min-max] current  Temp:  [97.9 F (36.6 C)-98.4 F (36.9 C)] 97.9 F (36.6 C) (11/02 0445) Pulse Rate:  [52-78] 76 (11/02 0445) Resp:  [18-20] 18 (11/02 0445) BP: (121-141)/(64-90) 133/90 (11/02 0445) SpO2:  [96 %-100 %] 99 % (11/02 0445) Weight:  [42.9 kg] 42.9 kg (11/02 0530)     Height: 5\' 4"  (162.6 cm) Weight: 42.9 kg BMI (Calculated): 16.21   Physical Exam:  Constitutional: alert, cooperative and no distress  Respiratory: breathing non-labored at rest  Cardiovascular: regular rate and sinus rhythm  Gastrointestinal: soft, non-tender, and non-distended  Labs:  CBC Latest Ref Rng & Units 10/06/2021 10/05/2021 10/02/2021  WBC 4.0 - 10.5 K/uL 8.4 8.0 6.7  Hemoglobin 12.0 - 15.0 g/dL 11.1(L) 11.7(L) 12.2  Hematocrit 36.0 - 46.0 % 34.0(L) 37.1 36.8  Platelets 150 - 400 K/uL 156 190 205   CMP Latest Ref Rng & Units 10/10/2021 10/09/2021 10/08/2021  Glucose 70 - 99 mg/dL 102(H) 103(H) 112(H)  BUN 8 - 23 mg/dL 43(H) 40(H) 39(H)  Creatinine 0.44 - 1.00 mg/dL 0.84 0.74 0.87  Sodium 135 - 145 mmol/L 135 140 140  Potassium 3.5 - 5.1 mmol/L 4.4 4.4 4.2  Chloride 98 - 111 mmol/L 103 109 107  CO2 22 - 32 mmol/L 26 25 27   Calcium 8.9 - 10.3 mg/dL 8.0(L) 8.2(L) 7.8(L)  Total Protein 6.5 - 8.1 g/dL - - 5.0(L)  Total Bilirubin 0.3 - 1.2 mg/dL - - 0.5  Alkaline Phos 38 - 126 U/L - - 36(L)  AST 15 - 41 U/L - - 16  ALT 0 - 44 U/L - - 9    Imaging studies: No new pertinent imaging studies   Assessment/Plan:  87 y.o. adult with small bowel obstruction 5 Day Post-Op s/p exploratory laparotomy and  lysis of adhesion, complicated by pertinent comorbidities including hypertension, dyslipidemia, gout and GERD.  Patient evaluated today and found very comfortable.  She had multiple bowel movement yesterday during the night.  She did not drink any of the clear liquids even with help of the nurse.  Not sure why but we will try to advance to full liquid to see if this is more appetizing.  We will continue TPN until patient has adequate oral caloric intake.  Wounds are healing well.  We will continue patient to continue PT.  I will continue to follow closely.  Arnold Long, MD

## 2021-10-11 LAB — COMPREHENSIVE METABOLIC PANEL
ALT: 11 U/L (ref 0–44)
AST: 21 U/L (ref 15–41)
Albumin: 2.4 g/dL — ABNORMAL LOW (ref 3.5–5.0)
Alkaline Phosphatase: 42 U/L (ref 38–126)
Anion gap: 7 (ref 5–15)
BUN: 45 mg/dL — ABNORMAL HIGH (ref 8–23)
CO2: 24 mmol/L (ref 22–32)
Calcium: 8.1 mg/dL — ABNORMAL LOW (ref 8.9–10.3)
Chloride: 103 mmol/L (ref 98–111)
Creatinine, Ser: 0.92 mg/dL (ref 0.44–1.00)
GFR, Estimated: 57 mL/min — ABNORMAL LOW (ref >60)
Glucose, Bld: 111 mg/dL — ABNORMAL HIGH (ref 70–99)
Potassium: 4.4 mmol/L (ref 3.5–5.1)
Sodium: 134 mmol/L — ABNORMAL LOW (ref 135–145)
Total Bilirubin: 0.4 mg/dL (ref 0.3–1.2)
Total Protein: 5.1 g/dL — ABNORMAL LOW (ref 6.5–8.1)

## 2021-10-11 LAB — MAGNESIUM: Magnesium: 1.8 mg/dL (ref 1.7–2.4)

## 2021-10-11 LAB — PHOSPHORUS: Phosphorus: 3.6 mg/dL (ref 2.5–4.6)

## 2021-10-11 MED ORDER — TRACE MINERALS CU-MN-SE-ZN 300-55-60-3000 MCG/ML IV SOLN
INTRAVENOUS | Status: AC
  Filled 2021-10-11: qty 496

## 2021-10-11 NOTE — Progress Notes (Signed)
Physical Therapy Treatment Patient Details Name: Kathy Villanueva MRN: [id number] DOB: [birth year] Today's Date: 10/11/2021   History of Present Illness presented to ER secondary to abdominal pain, vomiting; admitted for management of SBO (likely due to adhesions).  Initial recommendation for conservative measures; monitoring daily for status and need for surgical intervention. Now, pt is s/p Lapartomy, currently POD 4 at time of re-evaluation.    PT Comments    Pt was sitting in recliner with TPN running upon arriving. She was requesting to return to bed. Barely touch lunch tray in front of her. Author encouraged intake however pt unwilling. She is alert but disoriented. Knew she was in hospital but thought she was at [place] to [facility] that impacts her ability to process desired task without increased processing time. She was able to stand and ambulate with RW ~ 8 ft however struggles with LLE advancement. Pt fatigued quickly with SOB noted. Very fatigued form minimal activity. Highly recommend DC to SNF to address weakness and poor activity tolerance. Will need extensive PT going forward to maximize independence with ADLs.     Recommendations for follow up therapy are one component of a multi-disciplinary discharge planning process, led by the attending physician.  Recommendations may be updated based on patient status, additional functional criteria and insurance authorization.  Follow Up Recommendations  Skilled nursing-short term rehab (<3 hours/day)     Assistance Recommended at Discharge Frequent or constant Supervision/Assistance  Equipment Recommendations  Other (comment) (defer to next level of care)       Precautions / Restrictions Precautions Precautions: Fall Precaution Comments: TPN Restrictions Weight Bearing Restrictions: No     Mobility  Bed Mobility Overal bed mobility: Needs Assistance Bed Mobility: Sit to Supine       Sit to supine: Min guard    General bed mobility comments: CGA for safety to progress from short sit EOB to long sitting in bed.    Transfers Overall transfer level: Needs assistance Equipment used: Rolling walker (2 wheels) Transfers: Sit to/from Stand Sit to Stand: Min assist           General transfer comment: Min assist to stand from recliner with vcs for imporved technique and sequencing. pt very eager for returning to bed and is HOH.    Ambulation/Gait Ambulation/Gait assistance: Min assist;Mod assist Gait Distance (Feet): 8 Feet Assistive device: Rolling walker (2 wheels) Gait Pattern/deviations: Step-to pattern Gait velocity: decreased   General Gait Details: Pt was able to ambulate around bed ~ 8 ft however fatyigued extremely quickly requesting to return to bed . pt struggles with advancement of LLE. Mod assist for safety once fatigued     Balance Overall balance assessment: Needs assistance Sitting-balance support: Feet supported Sitting balance-Leahy Scale: Fair Sitting balance - Comments: once fatigued balance becomes poor   Standing balance support: Bilateral upper extremity supported;During functional activity;Reliant on assistive device for balance Standing balance-Leahy Scale: Poor Standing balance comment: requires constajt assistance to prevent fall with dynamic standing activity        Cognition Arousal/Alertness: Awake/alert Behavior During Therapy: Flat affect Overall Cognitive Status: History of cognitive impairments - at baseline          General Comments: Pt is A but disoriented. She knows she is in hospital but thinks she is at [facility]. unaware of day but did know year. She is slightly SOPB even at rest prior to standing and returning to bed.  Pertinent Vitals/Pain Pain Assessment: No/denies pain Faces Pain Scale: No hurt     PT Goals (current goals can now be found in the care plan section) Acute Rehab PT Goals Patient Stated Goal: To go  home Progress towards PT goals: Progressing toward goals (limited by fatigue)    Frequency    Min 2X/week      PT Plan Current plan remains appropriate          End of Session   Activity Tolerance: Patient limited by fatigue Patient left: in bed;with call bell/phone within reach;with bed alarm set Nurse Communication: Mobility status PT Visit Diagnosis: Muscle weakness (generalized) (M62.81);Difficulty in walking, not elsewhere classified (R26.2);Pain Pain - Right/Left: Left     Time: 8592-9244 PT Time Calculation (min) (ACUTE ONLY): 10 min  Charges:  $Therapeutic Activity: 8-22 mins                    Julaine Fusi PTA 10/11/21, 2:00 PM

## 2021-10-11 NOTE — TOC Progression Note (Signed)
Transition of Care [facility]) - Progression Note    Patient Details  Name: Kathy Villanueva MRN: [id number] Date of Birth: 12-Dec-1923  Transition of Care [facility]) CM/SW [place], LCSW Phone Number: 10/11/2021, 4:09 PM  Clinical Narrative:   Spoke with daughter. She is trying to get patient more PCS hours. Patient currently active with [facility] for PCS. Spoke with Manfred Arch. Will fill out required form and have MD sign tomorrow. Patient will have to be assessed to determine if she meets criteria to increase hours. Daughter agreeable to home health. First preference is [place]. Sent referral information to [employer]. Patient currently has a rollator and 3-in-1 at home. Sent secure chat to PT and OT to see if she may need any other DME at home.  Expected Discharge Plan: [place] Barriers to Discharge: Continued Medical Work up  Expected Discharge Plan and Services Expected Discharge Plan: [place] Acute Care Choice:  (TBD) Living arrangements for the past 2 months: Apartment                                       Social Determinants of Health (SDOH) Interventions    Readmission Risk Interventions No flowsheet data found.

## 2021-10-11 NOTE — Progress Notes (Signed)
PROGRESS NOTE    Kathy Villanueva  CZY:[id number] DOB: 25-Apr-1924 DOA: 10/01/2021 PCP: Baxter Hire, MD   Chief Complain: Abdominal pain  Brief Narrative: Patient is a 87 year old female with history of hypertension, dyslipidemia, gout, GERD, osteoarthritis who presented here with generalized abdominal pain, severe nausea and vomiting.  CT abdomen/pelvis showed multiple dilated fluid filled loops of small bowel consistent with SBO.  Initially NG tube was placed, general surgery consulted.  She underwent expiratory laparotomy and 10/27 with lysis of additions.  Started on TPN.  She started having bowel movements.  Currently on soft diet  Assessment & Plan:   Active Problems:   SBO (small bowel obstruction) (HCC)   Protein-calorie malnutrition, severe   SBO: Failed conservative management.  Underwent expiratory laparotomy on 10/27.  Also on TPN, being planned to stop. General surgery following  Hypomagnesemia: Currently being monitored and supplemented as needed  Hypertension: On amlodipine and hydrochlorothiazide at home.  Oral antihypertensives on hold.  Continue pain medications for severe hypertension  AKI on CKD stage IIIb: Currently stable.  Deconditioning/debility: PT/OT, discussed with team discharge.  Patient and family prefer to go home with home therapy    Nutrition Problem: Severe Malnutrition Etiology: social / environmental circumstances      DVT prophylaxis:Lovenox Code Status: Full Family Communication: Discussed with son at the bedside Status is: Inpatient  Remains inpatient appropriate because: Patient on TPN, needs general surgery clearance    Consultants: General surgery  Procedures: Exploratory laparotomy  Antimicrobials:  Anti-infectives (From admission, onward)    Start     Dose/Rate Route Frequency Ordered Stop   10/04/21 1400  ceFAZolin (ANCEF) powder 1 g        1 g Other To Surgery 10/04/21 1306 10/05/21 1400        Subjective:  Patient seen and examined at the bedside this morning.  Hemodynamically stable.  Son at the bedside.  Denies any nausea or abdominal pain or vomiting.  Currently she is on soft diet   Objective: Vitals:   10/10/21 1005 10/10/21 2012 10/11/21 0500 10/11/21 0905  BP: 109/63 130/78  (!) 144/75  Pulse: 74 70  64  Resp:  18  16  Temp: 97.7 F (36.5 C) 97.7 F (36.5 C)  97.9 F (36.6 C)  TempSrc: Oral Oral  Oral  SpO2: 98% 99%  96%  Weight:   43.5 kg   Height:        Intake/Output Summary (Last 24 hours) at 10/11/2021 1144 Last data filed at 10/11/2021 0800 Gross per 24 hour  Intake 870.24 ml  Output 200 ml  Net 670.24 ml   Filed Weights   10/09/21 0444 10/10/21 0530 10/11/21 0500  Weight: 45.4 kg 42.9 kg 43.5 kg    Examination:  General exam: Overall comfortable, not in distress HEENT: PERRL Respiratory system:  no wheezes or crackles  Cardiovascular system: S1 & S2 heard, RRR.  Gastrointestinal system: Abdomen is nondistended, soft and nontender.Midline Sutures Central nervous system: Alert and oriented Extremities: No edema, no clubbing ,no cyanosis Skin: No rashes, no ulcers,no icterus       Data Reviewed: I have personally reviewed following labs and imaging studies  CBC: Recent Labs  Lab 10/05/21 0554 10/06/21 0444  WBC 8.0 8.4  HGB 11.7* 11.1*  HCT 37.1 34.0*  MCV 94.2 91.6  PLT 190 093   Basic Metabolic Panel: Recent Labs  Lab 10/06/21 0444 10/07/21 0356 10/08/21 0442 10/09/21 0529 10/10/21 0520 10/11/21 0726  NA 144 142 140 140  135 134*  K 3.7 3.5 4.2 4.4 4.4 4.4  CL 117* 109 107 109 103 103  CO2 22 27 27 25 26 24   GLUCOSE 145* 120* 112* 103* 102* 111*  BUN 34* 31* 39* 40* 43* 45*  CREATININE 1.16* 0.89 0.87 0.74 0.84 0.92  CALCIUM 8.2* 8.1* 7.8* 8.2* 8.0* 8.1*  MG 1.9 1.7 1.9 1.6* 1.9 1.8  PHOS 2.0* 2.0* 3.7 2.9  --  3.6   GFR: Estimated Creatinine Clearance (by C-G formula based on SCr of 0.92 mg/dL) Female: 25.1  mL/min Female: 29.6 mL/min Liver Function Tests: Recent Labs  Lab 10/05/21 0554 10/06/21 0444 10/08/21 0442 10/09/21 0529 10/11/21 0726  AST 19  --  16  --  21  ALT 10  --  9  --  11  ALKPHOS 28*  --  36*  --  42  BILITOT 1.1  --  0.5  --  0.4  PROT 5.6*  --  5.0*  --  5.1*  ALBUMIN 2.8* 2.6* 2.3* 2.3* 2.4*   No results for input(s): LIPASE, AMYLASE in the last 168 hours. No results for input(s): AMMONIA in the last 168 hours. Coagulation Profile: No results for input(s): INR, PROTIME in the last 168 hours. Cardiac Enzymes: No results for input(s): CKTOTAL, CKMB, CKMBINDEX, TROPONINI in the last 168 hours. BNP (last 3 results) No results for input(s): PROBNP in the last 8760 hours. HbA1C: No results for input(s): HGBA1C in the last 72 hours. CBG: Recent Labs  Lab 10/07/21 0509 10/07/21 1157 10/07/21 1807 10/08/21 0148 10/08/21 0537  GLUCAP 124* 106* 122* 118* 107*   Lipid Profile: No results for input(s): CHOL, HDL, LDLCALC, TRIG, CHOLHDL, LDLDIRECT in the last 72 hours. Thyroid Function Tests: No results for input(s): TSH, T4TOTAL, FREET4, T3FREE, THYROIDAB in the last 72 hours. Anemia Panel: No results for input(s): VITAMINB12, FOLATE, FERRITIN, TIBC, IRON, RETICCTPCT in the last 72 hours. Sepsis Labs: No results for input(s): PROCALCITON, LATICACIDVEN in the last 168 hours.  Recent Results (from the past 240 hour(s))  Resp Panel by RT-PCR (Flu A&B, Covid) Nasopharyngeal Swab     Status: None   Collection Time: 10/01/21  3:52 PM   Specimen: Nasopharyngeal Swab; Nasopharyngeal(NP) swabs in vial transport medium  Result Value Ref Range Status   SARS Coronavirus 2 by RT PCR NEGATIVE NEGATIVE Final    Comment: (NOTE) SARS-CoV-2 target nucleic acids are NOT DETECTED.  The SARS-CoV-2 RNA is generally detectable in upper respiratory specimens during the acute phase of infection. The lowest concentration of SARS-CoV-2 viral copies this assay can detect is 138  copies/mL. A negative result does not preclude SARS-Cov-2 infection and should not be used as the sole basis for treatment or other patient management decisions. A negative result may occur with  improper specimen collection/handling, submission of specimen other than nasopharyngeal swab, presence of viral mutation(s) within the areas targeted by this assay, and inadequate number of viral copies(<138 copies/mL). A negative result must be combined with clinical observations, patient history, and epidemiological information. The expected result is Negative.  Fact Sheet for Patients:  EntrepreneurPulse.com.au  Fact Sheet for Healthcare Providers:  IncredibleEmployment.be  This test is no t yet approved or cleared by the [place] FDA and  has been authorized for detection and/or diagnosis of SARS-CoV-2 by FDA under an Emergency Use Authorization (EUA). This EUA will remain  in effect (meaning this test can be used) for the duration of the COVID-19 declaration under Section 564(b)(1) of the Act, 21 U.S.C.section  360bbb-3(b)(1), unless the authorization is terminated  or revoked sooner.       Influenza A by PCR NEGATIVE NEGATIVE Final   Influenza B by PCR NEGATIVE NEGATIVE Final    Comment: (NOTE) The Xpert Xpress SARS-CoV-2/FLU/RSV plus assay is intended as an aid in the diagnosis of influenza from Nasopharyngeal swab specimens and should not be used as a sole basis for treatment. Nasal washings and aspirates are unacceptable for Xpert Xpress SARS-CoV-2/FLU/RSV testing.  Fact Sheet for Patients: EntrepreneurPulse.com.au  Fact Sheet for Healthcare Providers: IncredibleEmployment.be  This test is not yet approved or cleared by the [place] FDA and has been authorized for detection and/or diagnosis of SARS-CoV-2 by FDA under an Emergency Use Authorization (EUA). This EUA will remain in effect (meaning  this test can be used) for the duration of the COVID-19 declaration under Section 564(b)(1) of the Act, 21 U.S.C. section 360bbb-3(b)(1), unless the authorization is terminated or revoked.  Performed at [facility], [address] [place], [place] [address]          Radiology Studies: No results found.      Scheduled Meds:  Chlorhexidine Gluconate Cloth  6 each Topical Daily   enoxaparin (LOVENOX) injection  30 mg Subcutaneous Q24H   pantoprazole (PROTONIX) IV  40 mg Intravenous Q12H   sodium chloride flush  10-40 mL Intracatheter Q12H   Continuous Infusions:  TPN ADULT (ION) 50 mL/hr at 10/11/21 0114   TPN ADULT (ION)       LOS: 10 days    Time spent: 25 mins,More than 50% of that time was spent in counseling and/or coordination of care.      Shelly Coss, MD [facility] P11/02/2021, 11:44 AM

## 2021-10-11 NOTE — Progress Notes (Signed)
Patient ID: Kathy Villanueva, adult   DOB: [birth year], 87 y.o.   MRN: [id number]     [facility] Day(s): 10.   Interval History: Patient seen and examined, no acute events or new complaints overnight. Patient reports feeling well.  She denies abdominal pain.  I asked the patient why she wants to eat but she did not mention anything specific.  Denies any nausea or vomiting.  Vital signs in last 24 hours: [min-max] current  Temp:  [97.7 F (36.5 C)] 97.7 F (36.5 C) (11/02 2012) Pulse Rate:  [70-74] 70 (11/02 2012) Resp:  [18] 18 (11/02 2012) BP: (109-130)/(63-78) 130/78 (11/02 2012) SpO2:  [98 %-99 %] 99 % (11/02 2012) Weight:  [43.5 kg] 43.5 kg (11/03 0500)     Height: 5\' 4"  (162.6 cm) Weight: 43.5 kg BMI (Calculated): 16.45   Physical Exam:  Constitutional: alert, cooperative and no distress  Respiratory: breathing non-labored at rest  Cardiovascular: regular rate and sinus rhythm  Gastrointestinal: soft, non-tender, and non-distended  Labs:  CBC Latest Ref Rng & Units 10/06/2021 10/05/2021 10/02/2021  WBC 4.0 - 10.5 K/uL 8.4 8.0 6.7  Hemoglobin 12.0 - 15.0 g/dL 11.1(L) 11.7(L) 12.2  Hematocrit 36.0 - 46.0 % 34.0(L) 37.1 36.8  Platelets 150 - 400 K/uL 156 190 205   CMP Latest Ref Rng & Units 10/11/2021 10/10/2021 10/09/2021  Glucose 70 - 99 mg/dL 111(H) 102(H) 103(H)  BUN 8 - 23 mg/dL 45(H) 43(H) 40(H)  Creatinine 0.44 - 1.00 mg/dL 0.92 0.84 0.74  Sodium 135 - 145 mmol/L 134(L) 135 140  Potassium 3.5 - 5.1 mmol/L 4.4 4.4 4.4  Chloride 98 - 111 mmol/L 103 103 109  CO2 22 - 32 mmol/L 24 26 25   Calcium 8.9 - 10.3 mg/dL 8.1(L) 8.0(L) 8.2(L)  Total Protein 6.5 - 8.1 g/dL 5.1(L) - -  Total Bilirubin 0.3 - 1.2 mg/dL 0.4 - -  Alkaline Phos 38 - 126 U/L 42 - -  AST 15 - 41 U/L 21 - -  ALT 0 - 44 U/L 11 - -    Imaging studies: No new pertinent imaging studies   Assessment/Plan:  87 y.o. adult with small bowel obstruction 7 Day Post-Op s/p exploratory laparotomy  and lysis of adhesion, complicated by pertinent comorbidities including hypertension, dyslipidemia, gout and GERD.  She will resolving ileus.  She continues to be stable.  No nausea or vomiting.  The only problem is that she does not want to eat anything at this moment.  I try to ask her if she wanted to see effect but she did not mention anything.  I encouraged her that for her to get better she needs to eat.  Also would like to discontinue TPN at some point but she is not getting any nutrition orally in the TPN.  Hopefully with the soft diet which is solid 8 will be more appetizing for her to be able to eat.  Otherwise at her normal state still for her to eat.  I also encouraged her to ambulate and continue physical therapy.  Arnold Long, MD

## 2021-10-11 NOTE — Progress Notes (Signed)
PHARMACY - TOTAL PARENTERAL NUTRITION CONSULT NOTE   Indication: Small bowel obstruction  Patient Measurements: Height: 5\' 4"  (162.6 cm) Weight: 43.5 kg (95 lb 14.4 oz) IBW/kg (Calculated) : 54.7 TPN AdjBW (KG): 42.8 Body mass index is 16.46 kg/m.  Assessment: 87 y/o female with h/o GERD, HTN, CKD 3b, B12 deficiency, gout and COVID 19 (may 2022) who is admitted with SBO.   Glucose / Insulin: serum BG 111, stable, not  requiring insulin / non-diabetic Electrolytes: WNL today Renal: SCr 1.32-->0.92 Hepatic: LFT, TG wnl at baseline Intake / Output; UOP not quantified MIVF: no MIVF GI Imaging:  10/27 abdominal x-ray shows persistent small bowel dilation without improvement. GI Surgeries / Procedures:  10/27: Exploratory laparotomy with enterolysis  Central access: 10/05/21 TPN start date: 10/05/21   Nutritional Goals: Goal TPN rate is 50 mL/hr (provides 74.4 g of protein and 1518.7 kcals per day)  RD Assessment: Estimated Needs Total Energy Estimated Needs: 1350-1550 Total Protein Estimated Needs: 65-80 grams Total Fluid Estimated Needs: 1.3 L  Current Nutrition: advanced to soft diet 11/3  Plan:  Continue TPN at goal rate of 50 mL/hr (total volume 1300 mL total including overfill) Nutritional components Amino acids (using 15% Clinisol): 74.4 grams Dextrose: 226.8 grams Lipids (using 20% SMOFlipid): 45 grams Electrolytes in TPN: Na 30mEq/L, K 75mEq/L (reduced 11/1), Ca 53mEq/L, Mg 80mEq/L(increased 11/3), and Phos 77mmol/L. Cl:Ac 1:1 Add standard MVI and trace elements to TPN Monitor TPN labs on Mon/Thurs, daily until stable  Dallie Piles 10/11/2021,6:59 AM

## 2021-10-12 LAB — BASIC METABOLIC PANEL
Anion gap: 7 (ref 5–15)
BUN: 50 mg/dL — ABNORMAL HIGH (ref 8–23)
CO2: 24 mmol/L (ref 22–32)
Calcium: 8.2 mg/dL — ABNORMAL LOW (ref 8.9–10.3)
Chloride: 104 mmol/L (ref 98–111)
Creatinine, Ser: 1.04 mg/dL — ABNORMAL HIGH (ref 0.44–1.00)
GFR, Estimated: 49 mL/min — ABNORMAL LOW (ref >60)
Glucose, Bld: 89 mg/dL (ref 70–99)
Potassium: 4.5 mmol/L (ref 3.5–5.1)
Sodium: 135 mmol/L (ref 135–145)

## 2021-10-12 LAB — MAGNESIUM: Magnesium: 1.9 mg/dL (ref 1.7–2.4)

## 2021-10-12 MED ORDER — TRACE MINERALS CU-MN-SE-ZN 300-55-60-3000 MCG/ML IV SOLN
INTRAVENOUS | Status: AC
  Filled 2021-10-12: qty 496

## 2021-10-12 MED ORDER — ENSURE ENLIVE PO LIQD
237.0000 mL | Freq: Two times a day (BID) | ORAL | Status: DC
  Administered 2021-10-13 – 2021-10-14 (×3): 237 mL via ORAL

## 2021-10-12 NOTE — Progress Notes (Signed)
Patient ID: Kathy Villanueva, adult   DOB: 17-Jun-1924, 87 y.o.   MRN: [id number]     [place] [facility] Day(s): 11.   Interval History: Patient seen and examined, no acute events or new complaints overnight. Patient reports feeling well.  Denies abdominal pain.  Denies any nausea or vomiting.  Vital signs in last 24 hours: [min-max] current  Temp:  [97.5 F (36.4 C)-99 F (37.2 C)] 98.4 F (36.9 C) (11/04 1517) Pulse Rate:  [71-105] 80 (11/04 1517) Resp:  [15-21] 15 (11/04 1517) BP: (109-124)/(59-71) 109/63 (11/04 1517) SpO2:  [99 %-100 %] 99 % (11/04 1517) Weight:  [40.6 kg] 40.6 kg (11/04 0417)     Height: 5\' 4"  (162.6 cm) Weight: 40.6 kg BMI (Calculated): 15.36   Physical Exam:  Constitutional: alert, cooperative and no distress  Respiratory: breathing non-labored at rest  Cardiovascular: regular rate and sinus rhythm  Gastrointestinal: soft, non-tender, and non-distended  Labs:  CBC Latest Ref Rng & Units 10/06/2021 10/05/2021 10/02/2021  WBC 4.0 - 10.5 K/uL 8.4 8.0 6.7  Hemoglobin 12.0 - 15.0 g/dL 11.1(L) 11.7(L) 12.2  Hematocrit 36.0 - 46.0 % 34.0(L) 37.1 36.8  Platelets 150 - 400 K/uL 156 190 205   CMP Latest Ref Rng & Units 10/12/2021 10/11/2021 10/10/2021  Glucose 70 - 99 mg/dL 89 111(H) 102(H)  BUN 8 - 23 mg/dL 50(H) 45(H) 43(H)  Creatinine 0.44 - 1.00 mg/dL 1.04(H) 0.92 0.84  Sodium 135 - 145 mmol/L 135 134(L) 135  Potassium 3.5 - 5.1 mmol/L 4.5 4.4 4.4  Chloride 98 - 111 mmol/L 104 103 103  CO2 22 - 32 mmol/L 24 24 26   Calcium 8.9 - 10.3 mg/dL 8.2(L) 8.1(L) 8.0(L)  Total Protein 6.5 - 8.1 g/dL - 5.1(L) -  Total Bilirubin 0.3 - 1.2 mg/dL - 0.4 -  Alkaline Phos 38 - 126 U/L - 42 -  AST 15 - 41 U/L - 21 -  ALT 0 - 44 U/L - 11 -    Imaging studies: No new pertinent imaging studies   Assessment/Plan:  87 y.o. adult with small bowel obstruction 8 Day Post-Op s/p exploratory laparotomy and lysis of adhesion, complicated by pertinent comorbidities  including hypertension, dyslipidemia, gout and GERD.  Patient without any clinical deterioration.  Stable vital signs.  No sign of recurrent ileus.  She does have very poor appetite.  Today at least she ate 25% of the breakfast.  Not sure how to improve her oral intake.  Otherwise she has been recovering well from surgery.  I will continue to follow closely.  Arnold Long, MD

## 2021-10-12 NOTE — Progress Notes (Signed)
PROGRESS NOTE    Kathy Villanueva  EXB:[id number] DOB: 1924-03-22 DOA: 10/01/2021 PCP: Baxter Hire, MD   Chief Complain: Abdominal pain  Brief Narrative: Patient is a 87 year old female with history of hypertension, dyslipidemia, gout, GERD, osteoarthritis who presented here with generalized abdominal pain, severe nausea and vomiting.  CT abdomen/pelvis showed multiple dilated fluid filled loops of small bowel consistent with SBO.  Initially NG tube was placed, general surgery consulted.  She underwent expiratory laparotomy and 10/27 with lysis of adhesions  Started on TPN.  She started having bowel movements.  Currently on soft diet  Assessment & Plan:   Active Problems:   SBO (small bowel obstruction) (HCC)   Protein-calorie malnutrition, severe   SBO: Failed conservative management.  Underwent expiratory laparotomy on 10/27.  Also on TPN, being tapered.  Denies any abdomen pain, nausea or vomiting, tolerating soft diet General surgery following  Hypomagnesemia: Currently being monitored and supplemented as needed  Hypertension: On amlodipine and hydrochlorothiazide at home.  Oral antihypertensives on hold. BP stable  AKI on CKD stage IIIb: Currently stable.  Deconditioning/debility: PT/OT, recommended SNF on discharge.  Patient and family prefer to go home with home therapy.  Education officer, museum following    Nutrition Problem: Severe Malnutrition Etiology: social / environmental circumstances      DVT prophylaxis:Lovenox Code Status: Full Family Communication: Discussed with daughters  at the bedside Status is: Inpatient  Remains inpatient appropriate because: Patient on TPN, needs general surgery clearance    Consultants: General surgery  Procedures: Exploratory laparotomy  Antimicrobials:  Anti-infectives (From admission, onward)    Start     Dose/Rate Route Frequency Ordered Stop   10/04/21 1400  ceFAZolin (ANCEF) powder 1 g        1 g Other To Surgery  10/04/21 1306 10/05/21 1400       Subjective:  Patient seen and examined at the bedside this morning.  Hemodynamically stable.  Comfortable.  Denies any new complaints today.  No bowel movement today  Objective: Vitals:   10/11/21 1543 10/11/21 1952 10/12/21 0404 10/12/21 0417  BP: 125/69 118/71 110/66 (!) 124/59  Pulse: 73 71 72 (!) 105  Resp: 16 20 (!) 21 16  Temp: 98.8 F (37.1 C) 98 F (36.7 C) 98.9 F (37.2 C) (!) 97.5 F (36.4 C)  TempSrc: Oral Oral Oral Oral  SpO2:  100% 99% 100%  Weight:    40.6 kg  Height:        Intake/Output Summary (Last 24 hours) at 10/12/2021 0826 Last data filed at 10/11/2021 2039 Gross per 24 hour  Intake 10 ml  Output --  Net 10 ml   Filed Weights   10/10/21 0530 10/11/21 0500 10/12/21 0417  Weight: 42.9 kg 43.5 kg 40.6 kg    Examination:  General exam: Overall comfortable, not in distress HEENT: PERRL Respiratory system:  no wheezes or crackles  Cardiovascular system: S1 & S2 heard, RRR.  Gastrointestinal system: Abdomen is nondistended, soft and nontender.linear surgical wound with sutures, bowel sounds present. Central nervous system: Alert and oriented Extremities: No edema, no clubbing ,no cyanosis Skin: No rashes, no ulcers,no icterus         Data Reviewed: I have personally reviewed following labs and imaging studies  CBC: Recent Labs  Lab 10/06/21 0444  WBC 8.4  HGB 11.1*  HCT 34.0*  MCV 91.6  PLT 102   Basic Metabolic Panel: Recent Labs  Lab 10/06/21 0444 10/07/21 0356 10/08/21 0442 10/09/21 0529 10/10/21 0520 10/11/21 7253  10/12/21 0603  NA 144 142 140 140 135 134* 135  K 3.7 3.5 4.2 4.4 4.4 4.4 4.5  CL 117* 109 107 109 103 103 104  CO2 22 27 27 25 26 24 24   GLUCOSE 145* 120* 112* 103* 102* 111* 89  BUN 34* 31* 39* 40* 43* 45* 50*  CREATININE 1.16* 0.89 0.87 0.74 0.84 0.92 1.04*  CALCIUM 8.2* 8.1* 7.8* 8.2* 8.0* 8.1* 8.2*  MG 1.9 1.7 1.9 1.6* 1.9 1.8 1.9  PHOS 2.0* 2.0* 3.7 2.9  --  3.6  --     GFR: Estimated Creatinine Clearance (by C-G formula based on SCr of 1.04 mg/dL (H)) Female: 20.7 mL/min (A) Female: 24.4 mL/min (A) Liver Function Tests: Recent Labs  Lab 10/06/21 0444 10/08/21 0442 10/09/21 0529 10/11/21 0726  AST  --  16  --  21  ALT  --  9  --  11  ALKPHOS  --  36*  --  42  BILITOT  --  0.5  --  0.4  PROT  --  5.0*  --  5.1*  ALBUMIN 2.6* 2.3* 2.3* 2.4*   No results for input(s): LIPASE, AMYLASE in the last 168 hours. No results for input(s): AMMONIA in the last 168 hours. Coagulation Profile: No results for input(s): INR, PROTIME in the last 168 hours. Cardiac Enzymes: No results for input(s): CKTOTAL, CKMB, CKMBINDEX, TROPONINI in the last 168 hours. BNP (last 3 results) No results for input(s): PROBNP in the last 8760 hours. HbA1C: No results for input(s): HGBA1C in the last 72 hours. CBG: Recent Labs  Lab 10/07/21 0509 10/07/21 1157 10/07/21 1807 10/08/21 0148 10/08/21 0537  GLUCAP 124* 106* 122* 118* 107*   Lipid Profile: No results for input(s): CHOL, HDL, LDLCALC, TRIG, CHOLHDL, LDLDIRECT in the last 72 hours. Thyroid Function Tests: No results for input(s): TSH, T4TOTAL, FREET4, T3FREE, THYROIDAB in the last 72 hours. Anemia Panel: No results for input(s): VITAMINB12, FOLATE, FERRITIN, TIBC, IRON, RETICCTPCT in the last 72 hours. Sepsis Labs: No results for input(s): PROCALCITON, LATICACIDVEN in the last 168 hours.  No results found for this or any previous visit (from the past 240 hour(s)).        Radiology Studies: No results found.      Scheduled Meds:  Chlorhexidine Gluconate Cloth  6 each Topical Daily   enoxaparin (LOVENOX) injection  30 mg Subcutaneous Q24H   pantoprazole (PROTONIX) IV  40 mg Intravenous Q12H   sodium chloride flush  10-40 mL Intracatheter Q12H   Continuous Infusions:  TPN ADULT (ION) 50 mL/hr at 10/11/21 1823     LOS: 11 days    Time spent: 25 mins,More than 50% of that time was spent in  counseling and/or coordination of care.      Shelly Coss, MD [facility] P11/03/2021, 8:26 AM

## 2021-10-12 NOTE — Progress Notes (Addendum)
Mobility Specialist - Progress Note   10/12/21 1100  Mobility  Activity Transferred:  Bed to chair  Level of Assistance Minimal assist, patient does 75% or more  Assistive Device Front wheel walker  Distance Ambulated (ft) 4 ft  Mobility Ambulated with assistance in room;Out of bed to chair with meals  Mobility Response Tolerated well  Mobility performed by Mobility specialist  $Mobility charge 1 Mobility    Pt ambulated bed-chair with minA and extra time. Mild posterior lean. Extra time to advance LLE. Fatigued. Pt left in chair with alarm set, daughter at bedside.    Kathee Delton Mobility Specialist 10/12/21, 11:14 AM

## 2021-10-12 NOTE — TOC Progression Note (Addendum)
Transition of Care [facility]) - Progression Note    Patient Details  Name: Kathy Villanueva MRN: [id number] Date of Birth: Apr 18, 1924  Transition of [facility]) CM/SW [place], LCSW Phone Number: 10/12/2021, 10:16 AM  Clinical Narrative:  CSW and MD filled out [place] form for [employer]. Faxed for review so patient can be assessed for more PCS services. Kathy Villanueva with [facility] is aware.   1:51 pm: Kathy Villanueva is able to accept referral for PT, OT, RN. Updated daughter. She is agreeable to DME recommendations.  2:57 pm: Asked MD to order 3-in-1 and lightweight wheelchair.  Expected Discharge Plan: [place] Barriers to Discharge: Continued Medical Work up  Expected Discharge Plan and Services Expected Discharge Plan: [place] Acute Care Choice:  (TBD) Living arrangements for the past 2 months: Apartment                                       Social Determinants of Health (SDOH) Interventions    Readmission Risk Interventions No flowsheet data found.

## 2021-10-12 NOTE — Plan of Care (Signed)

## 2021-10-12 NOTE — Progress Notes (Signed)
Occupational Therapy Treatment Patient Details Name: Kathy Villanueva MRN: [id number] DOB: 11/23/1924 Today's Date: 10/12/2021   History of present illness presented to ER secondary to abdominal pain, vomiting; admitted for management of SBO (likely due to adhesions).  Initial recommendation for conservative measures; monitoring daily for status and need for surgical intervention. Now, pt is s/p Lapartomy, currently POD 4 at time of re-evaluation.   OT comments  Pt seen for OT tx this date. Pt received in bed, alert, and agreeable to session. Pt sat EOB with supv/CGA and extra time to get feet positioned squarely on the floor. Pt tolerated sitting EOB with fair static and dynamic sitting balance to perform seated sponge bath, requiring MIN A to wash her back. Pt declined washing feet and peri bathing. She was able to wash her legs from sitting with set up and supervision. Pt endorsed fatigue afterwards, MINA for BLE mgt back to bed. Pt progressing. Family wants to take pt home. Recommend HHOT services and frequent+ supervision/assist available to maximize safety.   Recommendations for follow up therapy are one component of a multi-disciplinary discharge planning process, led by the attending physician.  Recommendations may be updated based on patient status, additional functional criteria and insurance authorization.    Follow Up Recommendations  Home health OT    Assistance Recommended at Discharge Frequent or constant Supervision/Assistance  Equipment Recommendations  BSC;Tub/shower seat    Recommendations for Other Services      Precautions / Restrictions Precautions Precautions: Fall Precaution Comments: TPN Restrictions Weight Bearing Restrictions: No       Mobility Bed Mobility Overal bed mobility: Needs Assistance Bed Mobility: Supine to Sit;Sit to Supine     Supine to sit: Supervision;Min guard;HOB elevated Sit to supine: Min assist   General bed mobility comments: Min A  BLE mgt    Transfers                         Balance Overall balance assessment: Needs assistance Sitting-balance support: Feet supported Sitting balance-Leahy Scale: Fair                                     ADL either performed or assessed with clinical judgement   ADL Overall ADL's : Needs assistance/impaired     Grooming: Sitting;Wash/dry hands;Wash/dry face;Set up;Supervision/safety   Upper Body Bathing: Sitting;Minimal assistance Upper Body Bathing Details (indicate cue type and reason): Min A to wash back, pt able to wash arms and chest with set up/supv Lower Body Bathing: Sitting/lateral leans;Set up;Supervison/ safety Lower Body Bathing Details (indicate cue type and reason): Pt washed her BLE with set up and supv, declined feet and peri bathing.                             Vision       Perception     Praxis      Cognition Arousal/Alertness: Awake/alert Behavior During Therapy: WFL for tasks assessed/performed Overall Cognitive Status: History of cognitive impairments - at baseline                                 General Comments: sweet, agreeable, follows commands well, just [facility]          Exercises     Shoulder Instructions  General Comments      Pertinent Vitals/ Pain       Pain Assessment: No/denies pain  Home Living                                          Prior Functioning/Environment              Frequency  Min 1X/week        Progress Toward Goals  OT Goals(current goals can now be found in the care plan section)  Progress towards OT goals: Progressing toward goals  Acute Rehab OT Goals Patient Stated Goal: none stated OT Goal Formulation: With patient Time For Goal Achievement: 10/17/21 Potential to Achieve Goals: [place] Frequency remains appropriate;Discharge plan needs to be updated    Co-evaluation                 AM-PAC OT "6  Clicks" Daily Activity     Outcome Measure   Help from another person eating meals?: A Little Help from another person taking care of personal grooming?: A Little Help from another person toileting, which includes using toliet, bedpan, or urinal?: A Lot Help from another person bathing (including washing, rinsing, drying)?: A Little Help from another person to put on and taking off regular upper body clothing?: A Little Help from another person to put on and taking off regular lower body clothing?: A Little 6 Click Score: 17    End of Session    OT Visit Diagnosis: Muscle weakness (generalized) (M62.81)   Activity Tolerance Patient tolerated treatment well   Patient Left in bed;with call bell/phone within reach;with bed alarm set   Nurse Communication          Time: 3785-8850 OT Time Calculation (min): 18 min  Charges: OT General Charges $OT Visit: 1 Visit OT Treatments $Self Care/Home Management : 8-22 mins  Ardeth Perfect., MPH, MS, OTR/L ascom [phone] 10/12/21, 2:54 PM

## 2021-10-12 NOTE — Progress Notes (Signed)
Mobility Specialist - Progress Note   10/12/21 1300  Mobility  Activity Transferred:  Chair to bed  Level of Assistance Minimal assist, patient does 75% or more  Assistive Device Front wheel walker  Distance Ambulated (ft) 4 ft  Mobility Ambulated with assistance in room;Sit up in bed/chair position for meals  Mobility Response Tolerated well  Mobility performed by Mobility specialist  $Mobility charge 1 Mobility    Pt transferred chair-bed with minA and extra time. Lunch arrival during session, tray set up with pt eating a little. Alarm set.   Kathee Delton Mobility Specialist 10/12/21, 1:21 PM

## 2021-10-12 NOTE — Progress Notes (Signed)
Nutrition Follow Up Note   DOCUMENTATION CODES:   Severe malnutrition in context of social or environmental circumstances  INTERVENTION:   TPN per pharmacy   Ensure Enlive po BID, each supplement provides 350 kcal and 20 grams of protein  Magic cup TID with meals, each supplement provides 290 kcal and 9 grams of protein  NUTRITION DIAGNOSIS:   Severe Malnutrition related to social / environmental circumstances as evidenced by severe fat depletion, severe muscle depletion.  GOAL:   Patient will meet greater than or equal to 90% of their needs -met with TPN   MONITOR:   PO intake, Supplement acceptance, Labs, Weight trends, Skin, I & O's (TPN), TPN  ASSESSMENT:   87 y/o female with h/o GERD, HTN, CKD 3b, B12 deficiency, gout and COVID 19 (may 2022) who is admitted with SBO.  Pt s/p ex lap with LOA 10/27  Pt tolerating TPN at goal rate. Refeed labs stable. Pt advanced to a soft diet 11/2. Pt with poor appetite and oral intake in hospital; pt eating <25% of meals. Pt did eat 25% of her breakfast this morning. RD will add supplements to help pt meet her estimated needs. Per chart, pt appears fairly weight stable since admit.    Medications reviewed and include: lovenox, protonix  Labs reviewed: K 4.5 wnl, BUN 50(H), creat 1.04(H), P 3.6 wnl, Mg 1.9 wnl Triglycerides- 66- 10/31  Diet Order:   Diet Order             DIET SOFT Room service appropriate? Yes; Fluid consistency: Thin  Diet effective now                  EDUCATION NEEDS:   No education needs have been identified at this time  Skin:  Skin Assessment: Skin Integrity Issues: Skin Integrity Issues:: Incisions Incisions: closed abdomen  Last BM:  11/3- type 7  Height:   Ht Readings from Last 1 Encounters:  10/04/21 5' 4"  (1.626 m)    Weight:   Wt Readings from Last 1 Encounters:  10/12/21 40.6 kg    Ideal Body Weight:  54.5 kg  BMI:  Body mass index is 15.36 kg/m.  Estimated Nutritional  Needs:   Kcal:  1300-1500kcal/day  Protein:  65-75g/day  Fluid:  1.1-1.3L/day  Koleen Distance MS, RD, LDN Please refer to [facility] for RD and/or RD on-call/weekend/after hours pager

## 2021-10-12 NOTE — Progress Notes (Signed)
PHARMACY - TOTAL PARENTERAL NUTRITION CONSULT NOTE   Indication: Small bowel obstruction  Patient Measurements: Height: 5\' 4"  (162.6 cm) Weight: 40.6 kg (89 lb 8.1 oz) IBW/kg (Calculated) : 54.7 TPN AdjBW (KG): 42.8 Body mass index is 15.36 kg/m.  Assessment: 87 y/o female with h/o GERD, HTN, CKD 3b, B12 deficiency, gout and COVID 19 (may 2022) who is admitted with SBO.   Glucose / Insulin: serum BG 89, stable, not  requiring insulin / non-diabetic Electrolytes: WNL  Renal: SCr 1.32-->0.74-->1.04 Hepatic: LFT, TG wnl at baseline Intake / Output; UOP not quantified MIVF: no MIVF GI Imaging:  10/27 abdominal x-ray shows persistent small bowel dilation without improvement. GI Surgeries / Procedures:  10/27: Exploratory laparotomy with enterolysis  Central access: 10/05/21 TPN start date: 10/05/21   Nutritional Goals: Goal TPN rate is 50 mL/hr (provides 74.4 g of protein and 1518.7 kcals per day)  RD Assessment: Estimated Needs Total Energy Estimated Needs: 1350-1550 Total Protein Estimated Needs: 65-80 grams Total Fluid Estimated Needs: 1.3 L  Current Nutrition: advanced to soft diet 11/3  Plan:  Continue TPN at goal rate of 50 mL/hr (total volume 1300 mL total including overfill) Nutritional components Amino acids (using 15% Clinisol): 74.4 grams Dextrose: 226.8 grams Lipids (using 20% SMOFlipid): 45 grams Electrolytes in TPN: Na 66mEq/L, K 56mEq/L (reduced 11/1), Ca 55mEq/L, Mg 38mEq/L(increased 11/3), and Phos 14mmol/L. Cl:Ac 1:1 Add standard MVI and trace elements to TPN Monitor TPN labs on Mon/Thurs, daily until stable  Dallie Piles 10/12/2021,7:50 AM

## 2021-10-12 NOTE — Care Management Important Message (Signed)
Important Message  Patient Details  Name: Elanore Talcott MRN: [id number] Date of Birth: 03-Jun-1924   Medicare Important Message Given:  Yes     Dannette Barbara 10/12/2021, 10:47 AM

## 2021-10-13 LAB — BASIC METABOLIC PANEL
Anion gap: 6 (ref 5–15)
BUN: 52 mg/dL — ABNORMAL HIGH (ref 8–23)
CO2: 24 mmol/L (ref 22–32)
Calcium: 8.1 mg/dL — ABNORMAL LOW (ref 8.9–10.3)
Chloride: 103 mmol/L (ref 98–111)
Creatinine, Ser: 1.16 mg/dL — ABNORMAL HIGH (ref 0.44–1.00)
GFR, Estimated: 43 mL/min — ABNORMAL LOW (ref >60)
Glucose, Bld: 102 mg/dL — ABNORMAL HIGH (ref 70–99)
Potassium: 4.4 mmol/L (ref 3.5–5.1)
Sodium: 133 mmol/L — ABNORMAL LOW (ref 135–145)

## 2021-10-13 LAB — MAGNESIUM: Magnesium: 2 mg/dL (ref 1.7–2.4)

## 2021-10-13 MED ORDER — POTASSIUM CHLORIDE 2 MEQ/ML IV SOLN
INTRAVENOUS | Status: DC
  Filled 2021-10-13: qty 496

## 2021-10-13 NOTE — Progress Notes (Signed)
PROGRESS NOTE    Kathy Villanueva  QMG:[id number] DOB: 10-15-1924 DOA: 10/01/2021 PCP: Baxter Hire, MD   Chief Complain: Abdominal pain  Brief Narrative: Patient is a 87 year old female with history of hypertension, dyslipidemia, gout, GERD, osteoarthritis who presented here with generalized abdominal pain, severe nausea and vomiting.  CT abdomen/pelvis showed multiple dilated fluid filled loops of small bowel consistent with SBO.  Initially NG tube was placed, general surgery consulted.  She underwent expiratory laparotomy and 10/27 with lysis of adhesions  Started on TPN.  She started having bowel movements.  Currently on soft diet  Assessment & Plan:   Active Problems:   SBO (small bowel obstruction) (HCC)   Protein-calorie malnutrition, severe   SBO: Failed conservative management.  Underwent expiratory laparotomy on 10/27.  Also on TPN, being tapered.  Denies any abdomen pain, nausea or vomiting, tolerating soft diet. General surgery following  Hypomagnesemia: Currently being monitored and supplemented as needed  Hypertension: On amlodipine and hydrochlorothiazide at home.  Oral antihypertensives on hold. BP stable  AKI on CKD stage IIIb: Currently stable.  Deconditioning/debility: PT/OT, recommended SNF on discharge.  Patient and family prefer to go home with home therapy.  Social worker following    Nutrition Problem: Severe Malnutrition Etiology: social / environmental circumstances      DVT prophylaxis:Lovenox Code Status: Full Family Communication: Discussed with daughters  at the bedside on 10/12/2021. Status is: Inpatient  Remains inpatient appropriate because: Patient on TPN   Consultants: General surgery  Procedures: Exploratory laparotomy  Antimicrobials:  Anti-infectives (From admission, onward)    Start     Dose/Rate Route Frequency Ordered Stop   10/04/21 1400  ceFAZolin (ANCEF) powder 1 g        1 g Other To Surgery 10/04/21 1306 10/05/21  1400       Subjective:  Patient seen and examined at the bedside this morning.  Hemodynamically  stable.  Denies any abdominal pain, nausea or vomiting.  Last bowel movement was 2 days ago.  Tolerating soft diet  Objective: Vitals:   10/12/21 2254 10/13/21 0500 10/13/21 0536 10/13/21 0730  BP: 132/75  (!) 152/69 132/73  Pulse: 72  76 73  Resp: 18  20 12   Temp: 98.6 F (37 C)  97.7 F (36.5 C) 98.8 F (37.1 C)  TempSrc: Oral  Oral   SpO2: 100%  100% 99%  Weight:  42.1 kg    Height:        Intake/Output Summary (Last 24 hours) at 10/13/2021 1130 Last data filed at 10/13/2021 3267 Gross per 24 hour  Intake 427.24 ml  Output 400 ml  Net 27.24 ml   Filed Weights   10/11/21 0500 10/12/21 0417 10/13/21 0500  Weight: 43.5 kg 40.6 kg 42.1 kg    Examination:  General exam: Overall comfortable, not in distress, pleasant elderly female HEENT: PERRL Respiratory system:  no wheezes or crackles  Cardiovascular system: S1 & S2 heard, RRR.  Gastrointestinal system: Abdomen is nondistended, soft and nontender.linear surgical wound on the abdomen Central nervous system: Alert and oriented, obeys commands Extremities: No edema, no clubbing ,no cyanosis Skin: No rashes, no ulcers,no icterus           Data Reviewed: I have personally reviewed following labs and imaging studies  CBC: No results for input(s): WBC, NEUTROABS, HGB, HCT, MCV, PLT in the last 168 hours.  Basic Metabolic Panel: Recent Labs  Lab 10/07/21 0356 10/08/21 0442 10/09/21 0529 10/10/21 0520 10/11/21 0726 10/12/21 0603 10/13/21 0500  NA 142 140 140 135 134* 135 133*  K 3.5 4.2 4.4 4.4 4.4 4.5 4.4  CL 109 107 109 103 103 104 103  CO2 27 27 25 26 24 24 24   GLUCOSE 120* 112* 103* 102* 111* 89 102*  BUN 31* 39* 40* 43* 45* 50* 52*  CREATININE 0.89 0.87 0.74 0.84 0.92 1.04* 1.16*  CALCIUM 8.1* 7.8* 8.2* 8.0* 8.1* 8.2* 8.1*  MG 1.7 1.9 1.6* 1.9 1.8 1.9 2.0  PHOS 2.0* 3.7 2.9  --  3.6  --   --     GFR: Estimated Creatinine Clearance (by C-G formula based on SCr of 1.16 mg/dL (H)) Female: 19.3 mL/min (A) Female: 22.7 mL/min (A) Liver Function Tests: Recent Labs  Lab 10/08/21 0442 10/09/21 0529 10/11/21 0726  AST 16  --  21  ALT 9  --  11  ALKPHOS 36*  --  42  BILITOT 0.5  --  0.4  PROT 5.0*  --  5.1*  ALBUMIN 2.3* 2.3* 2.4*   No results for input(s): LIPASE, AMYLASE in the last 168 hours. No results for input(s): AMMONIA in the last 168 hours. Coagulation Profile: No results for input(s): INR, PROTIME in the last 168 hours. Cardiac Enzymes: No results for input(s): CKTOTAL, CKMB, CKMBINDEX, TROPONINI in the last 168 hours. BNP (last 3 results) No results for input(s): PROBNP in the last 8760 hours. HbA1C: No results for input(s): HGBA1C in the last 72 hours. CBG: Recent Labs  Lab 10/07/21 0509 10/07/21 1157 10/07/21 1807 10/08/21 0148 10/08/21 0537  GLUCAP 124* 106* 122* 118* 107*   Lipid Profile: No results for input(s): CHOL, HDL, LDLCALC, TRIG, CHOLHDL, LDLDIRECT in the last 72 hours. Thyroid Function Tests: No results for input(s): TSH, T4TOTAL, FREET4, T3FREE, THYROIDAB in the last 72 hours. Anemia Panel: No results for input(s): VITAMINB12, FOLATE, FERRITIN, TIBC, IRON, RETICCTPCT in the last 72 hours. Sepsis Labs: No results for input(s): PROCALCITON, LATICACIDVEN in the last 168 hours.  No results found for this or any previous visit (from the past 240 hour(s)).        Radiology Studies: No results found.      Scheduled Meds:  Chlorhexidine Gluconate Cloth  6 each Topical Daily   enoxaparin (LOVENOX) injection  30 mg Subcutaneous Q24H   feeding supplement  237 mL Oral BID BM   pantoprazole (PROTONIX) IV  40 mg Intravenous Q12H   sodium chloride flush  10-40 mL Intracatheter Q12H   Continuous Infusions:  TPN ADULT (ION) 50 mL/hr at 10/13/21 0343   TPN ADULT (ION)       LOS: 12 days    Time spent: 25 mins,More than 50% of that  time was spent in counseling and/or coordination of care.      Shelly Coss, MD [facility] P11/04/2021, 11:30 AM

## 2021-10-13 NOTE — Progress Notes (Cosign Needed)
    Durable Medical Equipment  (From admission, onward)           Start     Ordered   10/13/21 1130  For home use only DME lightweight manual wheelchair with seat cushion  Once       Comments: Patient suffers from balance disorder which impairs their ability to perform daily activities like toileting in the home.  A walker will not resolve  issue with performing activities of daily living. A wheelchair will allow patient to safely perform daily activities. Patient is not able to propel themselves in the home using a standard weight wheelchair due to general weakness. Patient can self propel in the lightweight wheelchair. Length of need Lifetime. Accessories: elevating leg rests (ELRs), wheel locks, extensions and anti-tippers.   10/13/21 1129   10/13/21 1130  For home use only DME 3 n 1  Once        10/13/21 1129

## 2021-10-13 NOTE — Progress Notes (Signed)
PHARMACY - TOTAL PARENTERAL NUTRITION CONSULT NOTE   Indication: Small bowel obstruction  Patient Measurements: Height: 5\' 4"  (162.6 cm) Weight: 42.1 kg (92 lb 12.8 oz) IBW/kg (Calculated) : 54.7 TPN AdjBW (KG): 42.8 Body mass index is 15.93 kg/m.  Assessment: 87 y/o female with h/o GERD, HTN, CKD 3b, B12 deficiency, gout and COVID 19 (may 2022) who is admitted with SBO.   Glucose / Insulin: Normoglycemic, not requiring insulin / non-diabetic Electrolytes: Within normal limits  Renal: SCr & BUN trending up steadily Hepatic: LFT, TG wnl at baseline Intake / Output; UOP not quantified MIVF: N/A GI Imaging:  10/27 Abdominal x-ray shows persistent small bowel dilation without improvement. GI Surgeries / Procedures:  10/27: Exploratory laparotomy with enterolysis  Central access: 10/05/21 TPN start date: 10/05/21   Nutritional Goals: Goal TPN rate is 50 mL/hr (provides 74.4 g of protein and 1518.7 kcals per day)  RD Assessment: Estimated Needs Total Energy Estimated Needs: 1350-1550 Total Protein Estimated Needs: 65-80 grams Total Fluid Estimated Needs: 1.3 L  Current Nutrition: Advanced to soft diet 11/3  Plan:  Continue TPN at goal rate of 50 mL/hr (total volume 1300 mL total including overfill) Nutritional components Amino acids (using 15% Clinisol): 74.4 grams Dextrose: 226.8 grams Lipids (using 20% SMOFlipid): 45 grams Electrolytes in TPN: Na 52mEq/L, K 88mEq/L (reduced 11/1), Ca 9mEq/L, Mg 65mEq/L (increased 11/3), and Phos 43mmol/L. Cl:Ac 1:1 Add standard MVI and trace elements to TPN Monitor TPN labs on Mon/Thurs, daily until stable  Kathy Villanueva [place] 10/13/2021,7:45 AM

## 2021-10-13 NOTE — TOC Progression Note (Addendum)
Transition of Care [facility]) - Progression Note    Patient Details  Name: Kathy Villanueva MRN: [id number] Date of Birth: 11-Sep-1924  Transition of Care [facility]) CM/SW [place], LCSW Phone Number: 10/13/2021, 9:39 AM  Clinical Narrative:   Requested DME orders as per [facility] handoff.  11:40- DME ordered through [employer] - lightweight w/c and 3 in 1. Per Thedore Mins, RW order needs to be placed tomorrow.      Expected Discharge Plan: [place] Barriers to Discharge: Continued Medical Work up  Expected Discharge Plan and Services Expected Discharge Plan: [place] Acute Care Choice:  (TBD) Living arrangements for the past 2 months: Apartment                                       Social Determinants of Health (SDOH) Interventions    Readmission Risk Interventions No flowsheet data found.

## 2021-10-13 NOTE — Progress Notes (Signed)
Patient ID: Kathy Villanueva, adult   DOB: 1924-05-11, 87 y.o.   MRN: [id number]     [place] Hospital Day(s): 12.   Interval History: Patient seen and examined, no acute events or new complaints overnight. Patient reports feeling well.  Denies abdominal pain.  Patient says that she ate this morning.  I did not see the tray so I was unable to confirm this.  No nausea or vomiting.  Vital signs in last 24 hours: [min-max] current  Temp:  [97.7 F (36.5 C)-98.8 F (37.1 C)] 98.8 F (37.1 C) (11/05 0730) Pulse Rate:  [72-80] 73 (11/05 0730) Resp:  [12-20] 12 (11/05 0730) BP: (109-152)/(63-75) 132/73 (11/05 0730) SpO2:  [99 %-100 %] 99 % (11/05 0730) Weight:  [42.1 kg] 42.1 kg (11/05 0500)     Height: 5\' 4"  (162.6 cm) Weight: 42.1 kg BMI (Calculated): 15.92   Physical Exam:  Constitutional: alert, cooperative and no distress  Respiratory: breathing non-labored at rest  Cardiovascular: regular rate and sinus rhythm  Gastrointestinal: soft, non-tender, and non-distended  Labs:  CBC Latest Ref Rng & Units 10/06/2021 10/05/2021 10/02/2021  WBC 4.0 - 10.5 K/uL 8.4 8.0 6.7  Hemoglobin 12.0 - 15.0 g/dL 11.1(L) 11.7(L) 12.2  Hematocrit 36.0 - 46.0 % 34.0(L) 37.1 36.8  Platelets 150 - 400 K/uL 156 190 205   CMP Latest Ref Rng & Units 10/13/2021 10/12/2021 10/11/2021  Glucose 70 - 99 mg/dL 102(H) 89 111(H)  BUN 8 - 23 mg/dL 52(H) 50(H) 45(H)  Creatinine 0.44 - 1.00 mg/dL 1.16(H) 1.04(H) 0.92  Sodium 135 - 145 mmol/L 133(L) 135 134(L)  Potassium 3.5 - 5.1 mmol/L 4.4 4.5 4.4  Chloride 98 - 111 mmol/L 103 104 103  CO2 22 - 32 mmol/L 24 24 24   Calcium 8.9 - 10.3 mg/dL 8.1(L) 8.2(L) 8.1(L)  Total Protein 6.5 - 8.1 g/dL - - 5.1(L)  Total Bilirubin 0.3 - 1.2 mg/dL - - 0.4  Alkaline Phos 38 - 126 U/L - - 42  AST 15 - 41 U/L - - 21  ALT 0 - 44 U/L - - 11    Imaging studies: No new pertinent imaging studies   Assessment/Plan:  87 y.o. adult with small bowel obstruction 9 Day Post-Op  s/p exploratory laparotomy and lysis of adhesion, complicated by pertinent comorbidities including hypertension, dyslipidemia, gout and GERD.  Patient seems to be doing well.  Patient was very alert and looks comfortable.  Her only issue is that she has not wanted to eat.  May be this is her baseline.  She was BMI before admission.  She has not lost any weight.  I agree that it is reasonable to wean down and discontinue TPN.  Continue encouragement the patient and family to eat as much as she can.  She seems to be recovered from surgery.  I will continue to follow as well.  Continue physical therapy.  Arnold Long, MD

## 2021-10-14 MED ORDER — POLYETHYLENE GLYCOL 3350 17 G PO PACK: 17.0000 g | PACK | Freq: Every day | ORAL | 0 refills | Status: AC

## 2021-10-14 MED ORDER — PANTOPRAZOLE SODIUM 40 MG PO TBEC: 40.0000 mg | DELAYED_RELEASE_TABLET | Freq: Every day | ORAL | 0 refills | Status: DC

## 2021-10-14 NOTE — Progress Notes (Signed)
Patient ID: Kathy Villanueva, adult   DOB: 1924-01-11, 87 y.o.   MRN: [id number]     [place] Hospital Day(s): 13.   Interval History: Patient seen and examined, no acute events or new complaints overnight. Patient reports feeling well.  She seemed very awake and very comfortable.  She endorses that she ate.  As per family she eats very small amounts.  No nausea or vomiting.  Vital signs in last 24 hours: [min-max] current  Temp:  [97.9 F (36.6 C)-98.9 F (37.2 C)] 98.9 F (37.2 C) (11/06 0739) Pulse Rate:  [73-82] 73 (11/06 0739) Resp:  [12-18] 12 (11/06 0739) BP: (107-129)/(66-74) 117/66 (11/06 0739) SpO2:  [98 %-100 %] 99 % (11/06 0739) Weight:  [41 kg] 41 kg (11/06 0458)     Height: 5\' 4"  (162.6 cm) Weight: 41 kg BMI (Calculated): 15.49   Physical Exam:  Constitutional: alert, cooperative and no distress  Respiratory: breathing non-labored at rest  Cardiovascular: regular rate and sinus rhythm  Gastrointestinal: soft, non-tender, and non-distended.  Wound is dry and clean  Labs:  CBC Latest Ref Rng & Units 10/06/2021 10/05/2021 10/02/2021  WBC 4.0 - 10.5 K/uL 8.4 8.0 6.7  Hemoglobin 12.0 - 15.0 g/dL 11.1(L) 11.7(L) 12.2  Hematocrit 36.0 - 46.0 % 34.0(L) 37.1 36.8  Platelets 150 - 400 K/uL 156 190 205   CMP Latest Ref Rng & Units 10/13/2021 10/12/2021 10/11/2021  Glucose 70 - 99 mg/dL 102(H) 89 111(H)  BUN 8 - 23 mg/dL 52(H) 50(H) 45(H)  Creatinine 0.44 - 1.00 mg/dL 1.16(H) 1.04(H) 0.92  Sodium 135 - 145 mmol/L 133(L) 135 134(L)  Potassium 3.5 - 5.1 mmol/L 4.4 4.5 4.4  Chloride 98 - 111 mmol/L 103 104 103  CO2 22 - 32 mmol/L 24 24 24   Calcium 8.9 - 10.3 mg/dL 8.1(L) 8.2(L) 8.1(L)  Total Protein 6.5 - 8.1 g/dL - - 5.1(L)  Total Bilirubin 0.3 - 1.2 mg/dL - - 0.4  Alkaline Phos 38 - 126 U/L - - 42  AST 15 - 41 U/L - - 21  ALT 0 - 44 U/L - - 11    Imaging studies: No new pertinent imaging studies   Assessment/Plan:  87 y.o. adult with small bowel obstruction  10 Day Post-Op s/p exploratory laparotomy and lysis of adhesion, complicated by pertinent comorbidities including hypertension, dyslipidemia, gout and GERD.  Patient doing well.  She has been tolerating diet even though a small amount I think that that that is her baseline.  Wound is healing well.  No other indication for discharge when medically stable.  Agree to discontinue TPN.  If patient is discharged I will see her in my office next week to remove staples.  Arnold Long, MD

## 2021-10-14 NOTE — TOC Transition Note (Signed)
Transition of Care [facility]) - CM/SW Discharge Note   Patient Details  Name: Betheny Suchecki MRN: [id number] Date of Birth: 25-Aug-1924  [facility]) CM/SW Contact:  Magnus Ivan, LCSW Phone Number: 10/14/2021, 2:14 PM   Clinical Narrative:   Patient is being DC home todaay. CSW notified [facility] with [facility]. 3 in 1 and light weight w/c were delivered to bedside already. CSW ordered RW through [employer].   CSW was then notified by RN that patient has already left the hospital. Asked Jasmine with [employer] to drop ship RW to the home, she agreed.    Final next level of care: Home w Home Health Services Barriers to Discharge: Barriers Resolved   Patient Goals and CMS Choice   CMS Medicare.gov Compare Post Acute Care list provided to:: Patient Represenative (must comment) (Daughter at bedside)    Discharge Placement                       Discharge Plan and Services     Post Acute Care Choice:  (TBD)            DME Agency: [employer] Date DME Agency Contacted: 10/14/21   Representative spoke with at DME Agency: [place]: PT, OT, RN [place] Agency: [place] Date [place]: 10/14/21   Representative spoke with at [employer]: [place] ([place]) Interventions     Readmission Risk Interventions No flowsheet data found.

## 2021-10-14 NOTE — Progress Notes (Signed)
PHARMACY - TOTAL PARENTERAL NUTRITION CONSULT NOTE   Indication: Small bowel obstruction  Patient Measurements: Height: 5\' 4"  (162.6 cm) Weight: 41 kg (90 lb 4.8 oz) IBW/kg (Calculated) : 54.7 TPN AdjBW (KG): 42.8 Body mass index is 15.5 kg/m.  Assessment: 87 y/o female with h/o GERD, HTN, CKD 3b, B12 deficiency, gout and COVID 19 (may 2022) who is admitted with SBO.   Glucose / Insulin: Normoglycemic, not requiring insulin / non-diabetic Electrolytes: Within normal limits  Renal: SCr & BUN trending up steadily Hepatic: LFT, TG wnl at baseline Intake / Output; UOP not quantified MIVF: N/A GI Imaging:  10/27 Abdominal x-ray shows persistent small bowel dilation without improvement. GI Surgeries / Procedures:  10/27: Exploratory laparotomy with enterolysis  Central access: 10/05/21 TPN start date: 10/05/21   Nutritional Goals: Goal TPN rate is 50 mL/hr (provides 74.4 g of protein and 1518.7 kcals per day)  RD Assessment: Estimated Needs Total Energy Estimated Needs: 1350-1550 Total Protein Estimated Needs: 65-80 grams Total Fluid Estimated Needs: 1.3 L  Current Nutrition: Advanced to soft diet 11/3  Plan:  Continue TPN until bag runs out at 1800 or until discharge per MD. No further TPN to continue past that point  Kathy Villanueva 10/14/2021,8:04 AM

## 2021-10-14 NOTE — Progress Notes (Signed)
Pt left after the daughter came to pick up the pt. Wheelchair and 3-in-1 BSC was sent with them. AVS was given to daughter. PICC line was removed and secured with coban, not bleeding when pt left.

## 2021-10-14 NOTE — Discharge Summary (Signed)
Physician Discharge Summary  Kathy Villanueva VQQ:[id number] DOB: Feb 26, 1924 DOA: 10/01/2021  PCP: Baxter Hire, MD  Admit date: 10/01/2021 Discharge date: 10/14/2021  Admitted From: Home Disposition:  Home  Discharge Condition:Stable CODE STATUS:FULL Diet recommendation: soft diet for next 2-3 days   Brief/Interim Summary:  Patient is a 87 year old female with history of hypertension, dyslipidemia, gout, GERD, osteoarthritis who presented here with generalized abdominal pain, severe nausea and vomiting.  CT abdomen/pelvis showed multiple dilated fluid filled loops of small bowel consistent with SBO.  Initially NG tube was placed, general surgery consulted.  She underwent expiratory laparotomy and 10/27 with lysis of adhesions  Started on TPN.  She started having bowel movements.  Currently on soft diet.  General surgery cleared for discharge.  She will follow-up with general surgery in a week.  PT/OT recommended skilled nursing facility on discharge but patient wants to go home with home health  Following problems were addressed during hospitalization:  SBO: Failed conservative management.  Underwent expiratory laparotomy and lysis of adhesions on 10/27. Started having bowel movements. Also on TPN, now stopped.  Denies any abdomen pain, nausea or vomiting, tolerating soft diet. General surgery recommended to follow-up as an outpatient in a week   Hypertension: On amlodipine and hydrochlorothiazide at home. BP stable without any medications.  Antihypertensives discontinued   AKI on CKD stage IIIb: Currently stable.   Deconditioning/debility: PT/OT, recommended SNF on discharge.  Patient and family prefer to go home with home therapy.  Social worker was following       Discharge Diagnoses:  Active Problems:   SBO (small bowel obstruction) (HCC)   Protein-calorie malnutrition, severe    Discharge Instructions  Discharge Instructions     Diet general   Complete by: As  directed    Take soft diet for next 2-3 days   Discharge instructions   Complete by: As directed    1)Please take prescribed medications as instructed 2)Follow up with general surgery in a week to remove staples 3)Follow up with your PCP in 1 to 2 weeks   Increase activity slowly   Complete by: As directed    No wound care   Complete by: As directed       Allergies as of 10/14/2021   No Known Allergies      Medication List     STOP taking these medications    amLODipine 5 MG tablet Commonly known as: NORVASC   hydrochlorothiazide 12.5 MG tablet Commonly known as: HYDRODIURIL       TAKE these medications    Cyanocobalamin 1000 MCG Tbcr Take by mouth. What changed: Another medication with the same name was removed. Continue taking this medication, and follow the directions you see here.   molnupiravir EUA 200 MG Caps capsule Commonly known as: LAGEVRIO Take 4 capsules by mouth 2 (two) times daily.   pantoprazole 40 MG tablet Commonly known as: Protonix Take 1 tablet (40 mg total) by mouth daily.   polyethylene glycol 17 g packet Commonly known as: MiraLax Take 17 g by mouth daily.               Durable Medical Equipment  (From admission, onward)           Start     Ordered   10/14/21 0924  For home use only DME Walker rolling  Once       Question Answer Comment  Walker: With 5 Inch Wheels   Patient needs a walker to treat with the  following condition Balance disorder      10/14/21 0923   10/13/21 1130  For home use only DME lightweight manual wheelchair with seat cushion  Once       Comments: Patient suffers from balance disorder which impairs their ability to perform daily activities like toileting in the home.  A walker will not resolve  issue with performing activities of daily living. A wheelchair will allow patient to safely perform daily activities. Patient is not able to propel themselves in the home using a standard weight wheelchair  due to general weakness. Patient can self propel in the lightweight wheelchair. Length of need Lifetime. Accessories: elevating leg rests (ELRs), wheel locks, extensions and anti-tippers.   10/13/21 1129   10/13/21 1130  For home use only DME 3 n 1  Once        10/13/21 1129            Follow-up Information     [facility] Follow up.   Specialty: Home Health Services Why: They will follow up with you for your home health needs: Physical therapy, occupational therapy, and nursing. Contact information: [address] [place] [address] [phone]         Herbert Pun, MD Follow up in 1 week(s).   Specialty: General Surgery Why: For suture removal Contact information: [place] [address] [phone]                No Known Allergies  Consultations: Surgery   Procedures/Studies: DG Abdomen 1 View  Result Date: 10/01/2021 CLINICAL DATA:  NG tube placement EXAM: ABDOMEN - 1 VIEW COMPARISON:  Abdomen and pelvis CT, dated October 01, 2021 FINDINGS: Nasogastric tube is seen with its distal tip overlying the lateral aspect of the left upper quadrant. Versus likely within the body of the stomach. Multiple dilated small bowel loops are seen throughout the upper abdomen. Radiopaque surgical clips are seen within the right upper quadrant. No radio-opaque calculi or other significant radiographic abnormality are seen. IMPRESSION: 1. Nasogastric tube positioning, as described above. 2. Findings consistent with a small bowel obstruction. This correlates to findings noted on the prior abdomen pelvis CT. Electronically Signed   By: Virgina Norfolk M.D.   On: 10/01/2021 21:13   CT ABDOMEN PELVIS W CONTRAST  Result Date: 10/01/2021 CLINICAL DATA:  Abdomen pain and vomiting EXAM: CT ABDOMEN AND PELVIS WITH CONTRAST TECHNIQUE: Multidetector CT imaging of the abdomen and pelvis was performed using the standard protocol  following bolus administration of intravenous contrast. CONTRAST:  28mL OMNIPAQUE IOHEXOL 350 MG/ML SOLN COMPARISON:  CT 04/06/2014 FINDINGS: Lower chest: Lung bases demonstrate no acute consolidation or effusion. Borderline cardiac size. Hepatobiliary: No focal liver abnormality is seen. Status post cholecystectomy. No biliary dilatation. Pancreas: Unremarkable. No pancreatic ductal dilatation or surrounding inflammatory changes. Spleen: Normal in size without focal abnormality. Adrenals/Urinary Tract: Adrenal glands are within normal limits. Cortical atrophy within the kidneys. Coarse calcification at the upper pole right kidney. Dilated left greater than right renal pelvises without hydroureter or obstructing stone. The bladder is unremarkable. 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 Stomach/Bowel: The stomach is nonenlarged. Fluid-filled dilated mid small bowel measuring up to 3.3 cm. Relatively decompressed distal small bowel and colon, transition point not well-defined. No acute bowel wall thickening. Negative appendix. Vascular/Lymphatic: Moderate aortic atherosclerosis. No aneurysm. No suspicious nodes. Reproductive: Status post hysterectomy. No adnexal masses. Other: No free air.  Small to moderate free fluid in the pelvis  Musculoskeletal: Scoliosis and degenerative changes. No acute osseous abnormality. IMPRESSION: 1. Multiple dilated fluid-filled loops of small bowel with relatively decompressed distal small bowel and colon, findings are felt consistent with bowel obstruction though discrete transition point difficult to visualize. 2. Small amount of abdominopelvic ascites 3. Atrophic kidneys with probable scarring on the right. Electronically Signed   By: Donavan Foil M.D.   On: 10/01/2021 19:04   DG Chest Port 1 View  Result Date: 10/05/2021 CLINICAL DATA:  PICC line placement EXAM: PORTABLE CHEST 1 VIEW COMPARISON:  Portable exam 1629 hours compared to 06/27/2014 FINDINGS: Nasogastric  tube extends into stomach. LEFT arm PICC line tip projects over SVC. Normal heart size, mediastinal contours, and pulmonary vascularity. Minimal bibasilar atelectasis and questionable tiny pleural effusions. Remaining lungs clear. No pneumothorax or acute osseous findings. IMPRESSION: Minimal bibasilar atelectasis and questionable tiny pleural effusions. Electronically Signed   By: Lavonia Dana M.D.   On: 10/05/2021 16:40   DG Abd 2 Views  Result Date: 10/04/2021 CLINICAL DATA:  Small bowel obstruction EXAM: ABDOMEN - 2 VIEW COMPARISON:  Earlier films of the same day FINDINGS: Gastric tube extends into the decompressed stomach as before. Visualized lungs are clear. Tortuous ectatic thoracic aorta noted. No free air. Multiple dilated small bowel loops largely stable in number and degree of dilatation. There is some distal oral contrast material in dilated small bowel loops and some muscle seen in the decompressed colon as before. Lumbar dextroscoliosis with multilevel spondylitic change. Cholecystectomy clips. IMPRESSION: Persistent partial distal small-bowel obstruction. Electronically Signed   By: Lucrezia Europe M.D.   On: 10/04/2021 10:39   DG Abd 2 Views  Result Date: 10/03/2021 CLINICAL DATA:  Small bowel obstruction. EXAM: ABDOMEN - 2 VIEW COMPARISON:  October 02, 2021. FINDINGS: Nasogastric tube tip is seen in expected position of the stomach. Dilated small bowel loops are noted concerning for distal small bowel obstruction. No definite colonic dilatation is noted. Contrast is noted in the distal colon. IMPRESSION: Dilated small bowel loops are noted concerning for distal small bowel obstruction. Electronically Signed   By: Marijo Conception M.D.   On: 10/03/2021 13:57   DG Abd 2 Views  Result Date: 10/02/2021 CLINICAL DATA:  87 year old female with history of small-bowel obstruction. EXAM: ABDOMEN - 2 VIEW COMPARISON:  Abdominal radiograph 10/01/2021. FINDINGS: Nasogastric tube extends into the mid  body of the stomach. Multiple dilated loops of gas-filled small bowel are noted measuring up to 4.3 cm in diameter. There is a small amount of colonic gas and stool. No pneumoperitoneum. Surgical clips project over the right upper quadrant of the abdomen, from prior cholecystectomy. Iodinated contrast material within the lumen of the urinary bladder. IMPRESSION: 1. Support apparatus and postoperative changes, as above. 2. Persistent small bowel obstruction. Electronically Signed   By: Vinnie Langton M.D.   On: 10/02/2021 07:50   DG Abd Portable 1V-Small Bowel Obstruction Protocol-initial, 8 hr delay  Result Date: 10/02/2021 CLINICAL DATA:  Small bowel obstruction EXAM: PORTABLE ABDOMEN - 1 VIEW COMPARISON:  08/01/2021 FINDINGS: NG tube is in the stomach. Dilated gas and fluid/contrast filled small bowel loops are noted. Probable contrast in the right colon. No free air organomegaly. IMPRESSION: Small bowel obstruction pattern, unchanged. It appears that contrast has passed into the right colon. Electronically Signed   By: Rolm Baptise M.D.   On: 10/02/2021 18:30   DG Abd Portable 2V  Result Date: 10/04/2021 CLINICAL DATA:  Small bowel obstruction. EXAM: PORTABLE ABDOMEN - 2 VIEW  COMPARISON:  October 03, 2021. FINDINGS: Nasogastric tube tip is seen in stomach. Small bowel dilatation is noted concerning for distal small bowel obstruction. No free air is noted. IMPRESSION: Stable small bowel dilatation is noted concerning for distal small bowel obstruction. Electronically Signed   By: Marijo Conception M.D.   On: 10/04/2021 07:58   [place] EKG SITE RITE  Result Date: 10/05/2021 If Site Rite image not attached, placement could not be confirmed due to current cardiac rhythm.     Subjective: Patient seen and examined the bedside this morning.  Hemodynamically stable for discharge today.  I called the daughter and discussed about the discharge planning  Discharge Exam: Vitals:   10/14/21 0457 10/14/21  0739  BP: 107/66 117/66  Pulse: 73 73  Resp: 16 12  Temp: 98.1 F (36.7 C) 98.9 F (37.2 C)  SpO2: 98% 99%   Vitals:   10/13/21 2032 10/14/21 0457 10/14/21 0458 10/14/21 0739  BP: 123/74 107/66  117/66  Pulse: 78 73  73  Resp: 18 16  12   Temp: 97.9 F (36.6 C) 98.1 F (36.7 C)  98.9 F (37.2 C)  TempSrc: Axillary Oral    SpO2: 100% 98%  99%  Weight:   41 kg   Height:        General: Pt is alert, awake, not in acute distress Cardiovascular: RRR, S1/S2 +, no rubs, no gallops Respiratory: CTA bilaterally, no wheezing, no rhonchi Abdominal: Soft, NT, ND, bowel sounds +, linear abdominal surgical wound with staples Extremities: no edema, no cyanosis    The results of significant diagnostics from this hospitalization (including imaging, microbiology, ancillary and laboratory) are listed below for reference.     Microbiology: No results found for this or any previous visit (from the past 240 hour(s)).   Labs: BNP (last 3 results) No results for input(s): BNP in the last 8760 hours. Basic Metabolic Panel: Recent Labs  Lab 10/08/21 0442 10/09/21 0529 10/10/21 0520 10/11/21 0726 10/12/21 0603 10/13/21 0500  NA 140 140 135 134* 135 133*  K 4.2 4.4 4.4 4.4 4.5 4.4  CL 107 109 103 103 104 103  CO2 27 25 26 24 24 24   GLUCOSE 112* 103* 102* 111* 89 102*  BUN 39* 40* 43* 45* 50* 52*  CREATININE 0.87 0.74 0.84 0.92 1.04* 1.16*  CALCIUM 7.8* 8.2* 8.0* 8.1* 8.2* 8.1*  MG 1.9 1.6* 1.9 1.8 1.9 2.0  PHOS 3.7 2.9  --  3.6  --   --    Liver Function Tests: Recent Labs  Lab 10/08/21 0442 10/09/21 0529 10/11/21 0726  AST 16  --  21  ALT 9  --  11  ALKPHOS 36*  --  42  BILITOT 0.5  --  0.4  PROT 5.0*  --  5.1*  ALBUMIN 2.3* 2.3* 2.4*   No results for input(s): LIPASE, AMYLASE in the last 168 hours. No results for input(s): AMMONIA in the last 168 hours. CBC: No results for input(s): WBC, NEUTROABS, HGB, HCT, MCV, PLT in the last 168 hours. Cardiac Enzymes: No  results for input(s): CKTOTAL, CKMB, CKMBINDEX, TROPONINI in the last 168 hours. BNP: Invalid input(s): POCBNP CBG: Recent Labs  Lab 10/07/21 1157 10/07/21 1807 10/08/21 0148 10/08/21 0537  GLUCAP 106* 122* 118* 107*   D-Dimer No results for input(s): DDIMER in the last 72 hours. Hgb A1c No results for input(s): HGBA1C in the last 72 hours. Lipid Profile No results for input(s): CHOL, HDL, LDLCALC, TRIG, CHOLHDL, LDLDIRECT in  the last 72 hours. Thyroid function studies No results for input(s): TSH, T4TOTAL, T3FREE, THYROIDAB in the last 72 hours.  Invalid input(s): FREET3 Anemia work up No results for input(s): VITAMINB12, FOLATE, FERRITIN, TIBC, IRON, RETICCTPCT in the last 72 hours. Urinalysis    Component Value Date/Time   COLORURINE YELLOW (A) 10/02/2021 0227   APPEARANCEUR CLEAR (A) 10/02/2021 0227   APPEARANCEUR Clear 11/10/2015 1022   LABSPEC 1.036 (H) 10/02/2021 0227   PHURINE 5.0 10/02/2021 0227   GLUCOSEU NEGATIVE 10/02/2021 0227   HGBUR NEGATIVE 10/02/2021 0227   BILIRUBINUR NEGATIVE 10/02/2021 0227   BILIRUBINUR Negative 11/10/2015 1022   KETONESUR 5 (A) 10/02/2021 0227   PROTEINUR 30 (A) 10/02/2021 0227   NITRITE NEGATIVE 10/02/2021 0227   LEUKOCYTESUR SMALL (A) 10/02/2021 0227   Sepsis Labs Invalid input(s): PROCALCITONIN,  WBC,  LACTICIDVEN Microbiology No results found for this or any previous visit (from the past 240 hour(s)).  Please note: You were cared for by a hospitalist during your hospital stay. Once you are discharged, your primary care physician will handle any further medical issues. Please note that NO REFILLS for any discharge medications will be authorized once you are discharged, as it is imperative that you return to your primary care physician (or establish a relationship with a primary care physician if you do not have one) for your post hospital discharge needs so that they can reassess your need for medications and monitor your lab  values.    Time coordinating discharge: 40 minutes  SIGNED:   Shelly Coss, MD  [facility] 10/14/2021, 9:48 AM Pager [phone]  If 7PM-7AM, please contact night-coverage [email or web address] Password [email or web address]

## 2021-10-19 DIAGNOSIS — Z09 Encounter for follow-up examination after completed treatment for conditions other than malignant neoplasm: Secondary | ICD-10-CM | POA: Diagnosis not present

## 2021-10-19 DIAGNOSIS — K56609 Unspecified intestinal obstruction, unspecified as to partial versus complete obstruction: Secondary | ICD-10-CM | POA: Diagnosis not present

## 2021-10-19 DIAGNOSIS — N1832 Chronic kidney disease, stage 3b: Secondary | ICD-10-CM | POA: Diagnosis not present

## 2021-11-03 ENCOUNTER — Emergency Department
Admission: EM | Admit: 2021-11-03 | Discharge: 2021-11-03 | Disposition: A | Discharge status: Home / Self Care | Payer: Medicare HMO | Attending: Emergency Medicine | Admitting: Emergency Medicine

## 2021-11-03 ENCOUNTER — Encounter: Payer: Self-pay | Admitting: Emergency Medicine

## 2021-11-03 ENCOUNTER — Emergency Department: Payer: Medicare HMO

## 2021-11-03 ENCOUNTER — Other Ambulatory Visit: Payer: Self-pay

## 2021-11-03 DIAGNOSIS — K5641 Fecal impaction: Secondary | ICD-10-CM | POA: Diagnosis not present

## 2021-11-03 DIAGNOSIS — R0902 Hypoxemia: Secondary | ICD-10-CM | POA: Diagnosis not present

## 2021-11-03 DIAGNOSIS — K5900 Constipation, unspecified: Secondary | ICD-10-CM | POA: Diagnosis not present

## 2021-11-03 DIAGNOSIS — I10 Essential (primary) hypertension: Secondary | ICD-10-CM | POA: Diagnosis not present

## 2021-11-03 DIAGNOSIS — R001 Bradycardia, unspecified: Secondary | ICD-10-CM | POA: Diagnosis not present

## 2021-11-03 DIAGNOSIS — R5381 Other malaise: Secondary | ICD-10-CM | POA: Diagnosis not present

## 2021-11-03 MED ORDER — ACETAMINOPHEN 325 MG PO TABS
650.0000 mg | ORAL_TABLET | Freq: Once | ORAL | Status: AC
  Administered 2021-11-03: 650 mg via ORAL
  Filled 2021-11-03: qty 2

## 2021-11-03 MED ORDER — LIDOCAINE HCL URETHRAL/MUCOSAL 2 % EX GEL
1.0000 "application " | Freq: Once | CUTANEOUS | Status: AC
  Administered 2021-11-03: 1
  Filled 2021-11-03: qty 6

## 2021-11-03 NOTE — ED Triage Notes (Signed)
Pt to ED via [facility] from home for fecal impaction. Pt daughter states that pt had bowel surgery about 1 month ago. Pt has been taking Miralax every morning but it is not helping, pt daughter states that you can see the stool at the rectal opening. Pt is in NAD at this time.

## 2021-11-03 NOTE — ED Triage Notes (Signed)
Pt in via EMS from home with c/o lsideways stool. Pt had surgery on her bowels around a month ago. Pt feels like she is impacted and needs help. 185/97

## 2021-11-03 NOTE — ED Notes (Signed)
Patient c/o impaction. Last BM yesterday

## 2021-11-03 NOTE — ED Provider Notes (Signed)
[facility] Emergency Department Provider Note ____________________________________________   Event Date/Time   First MD Initiated Contact with Patient 11/03/21 1005     (approximate)  I have reviewed the triage vital signs and the nursing notes.   HISTORY  Chief Complaint Fecal Impaction  HPI Kathy Villanueva is a 87 y.o. adult with history of small bowel obstruction presents to the emergency department for treatment and evaluation of retained stool that is visible at rectal opening. Daughter reports last bowel movement was yesterday. Appetite is good. No fever.    Past Medical History:  Diagnosis Date   Arthritis    hand   GERD (gastroesophageal reflux disease)    Gout    hand   Gross hematuria    Hematoma of kidney    History of kidney stones    HTN (hypertension)    controlled on meds   Hydronephrosis    Hypercholesteremia    Kidney stones    Nausea    Transfusion history    Ureteral stone     Patient Active Problem List   Diagnosis Date Noted   Protein-calorie malnutrition, severe 10/03/2021   SBO (small bowel obstruction) ([place]) 10/01/2021   Hematoma of kidney 11/12/2015   History of nephrolithiasis 11/12/2015    Past Surgical History:  Procedure Laterality Date   ABDOMINAL HYSTERECTOMY     CATARACT EXTRACTION W/PHACO Left 10/20/2019   Procedure: CATARACT EXTRACTION PHACO AND INTRAOCULAR LENS PLACEMENT ([place]) LEFT MALYUGIN 01:25.9,  13.6%, 11.71;  Surgeon: Leandrew Koyanagi, MD;  Location: [facility];  Service: Ophthalmology;  Laterality: Left;   COLONOSCOPY     KIDNEY STONE SURGERY     LAPAROTOMY N/A 10/04/2021   Procedure: EXPLORATORY LAPAROTOMY;  Surgeon: Herbert Pun, MD;  Location: [facility];  Service: General;  Laterality: N/A;   LYSIS OF ADHESION N/A 10/04/2021   Procedure: LYSIS OF ADHESION;  Surgeon: Herbert Pun, MD;  Location: [facility];  Service: General;  Laterality: N/A;    Prior to  Admission medications   Medication Sig Start Date End Date Taking? Authorizing Provider  Cyanocobalamin 1000 MCG TBCR Take by mouth.    [provider]  molnupiravir EUA (LAGEVRIO) 200 MG CAPS capsule Take 4 capsules by mouth 2 (two) times daily. Patient not taking: No sig reported 04/19/21   [provider]  pantoprazole (PROTONIX) 40 MG tablet Take 1 tablet (40 mg total) by mouth daily. 10/14/21 10/14/22  Shelly Coss, MD  polyethylene glycol (MIRALAX) 17 g packet Take 17 g by mouth daily. 10/14/21   Shelly Coss, MD    Allergies Patient has no known allergies.  Family History  Problem Relation Age of Onset   Kidney disease Neg Hx    Bladder Cancer Neg Hx     Social History Social History   Tobacco Use   Smoking status: Never   Smokeless tobacco: Never  Vaping Use   Vaping Use: Never used  Substance Use Topics   Alcohol use: No    Alcohol/week: 0.0 standard drinks   Drug use: No    Review of Systems  Constitutional: No fever/chills Eyes: No visual changes. ENT: No sore throat. Cardiovascular: Denies chest pain. Respiratory: Denies shortness of breath. Gastrointestinal: No abdominal pain.  No nausea, no vomiting.  No diarrhea.  Positive for constipation. Genitourinary: Negative for dysuria. Musculoskeletal: Negative for back pain. Skin: Negative for rash. Neurological: Negative for headaches, focal weakness or numbness. ____________________________________________   PHYSICAL EXAM:  VITAL SIGNS: ED Triage Vitals  Enc  Vitals Group     BP 11/03/21 0824 (!) 154/90     Pulse Rate 11/03/21 0824 (!) 57     Resp 11/03/21 0824 16     Temp 11/03/21 0824 (!) 97.4 F (36.3 C)     Temp Source 11/03/21 0824 Oral     SpO2 11/03/21 0824 97 %     Weight 11/03/21 0822 100 lb (45.4 kg)     Height 11/03/21 0822 5\' 3"  (1.6 m)     Head Circumference --      Peak Flow --      Pain Score 11/03/21 0821 0     Pain Loc --      Pain Edu? --      Excl. in [place]?  --     Constitutional: Alert. Well appearing and in no acute distress. Eyes: Conjunctivae are normal Head: Atraumatic. Nose: No congestion/rhinnorhea. Mouth/Throat: Mucous membranes are moist.  Oropharynx non-erythematous. Neck: No stridor.   Hematological/Lymphatic/Immunilogical: No cervical lymphadenopathy. Cardiovascular: Normal rate, regular rhythm. Grossly normal heart sounds.  Good peripheral circulation. Respiratory: Normal respiratory effort.  No retractions. Lungs CTAB. Gastrointestinal: Soft No distention. No abdominal bruits. Soft stool ball at rectal opening. Genitourinary:  Musculoskeletal: No lower extremity tenderness nor edema.  No joint effusions. Neurologic:  Normal speech and language. No gross focal neurologic deficits are appreciated.. Skin:  Skin is warm, dry and intact. Abdominal surgical site appears well healed. Psychiatric: Mood and affect are normal. Speech and behavior are normal.  ____________________________________________   LABS (all labs ordered are listed, but only abnormal results are displayed)  Labs Reviewed - No data to display ____________________________________________  EKG  Not indicated. ____________________________________________  RADIOLOGY  ED MD interpretation:    Not indicated.  I, Sherrie George, personally viewed and evaluated these images (plain radiographs) as part of my medical decision making, as well as reviewing the written report by the radiologist.  Official radiology report(s): No results found.  ____________________________________________   PROCEDURES  Procedure(s) performed (including Critical Care):  Procedures  ____________________________________________   INITIAL IMPRESSION / ASSESSMENT AND PLAN     87 year old female presents to the emergency department for treatment and evaluation of constipation. On exam, she does have a soft stool ball at the rectal opening which was easily removed.  DRE  revealed additional soft stool but no hardened impaction.  DIFFERENTIAL DIAGNOSIS  Constipation, small bowel obstruction  ED COURSE  Stool removed from lower rectum. DG abdomen without concern for large stool burden or new bowel obstruction. Daughter advised to continue the MiraLax as prescribed and follow up with primary care or surgery for concerns.      As part of my medical decision making, I reviewed the following data within the [place] History obtained from family, Old chart reviewed, and Notes from prior ED visits  ___________________________________________   FINAL CLINICAL IMPRESSION(S) / ED DIAGNOSES  Final diagnoses:  Fecal impaction in rectum [facility])     ED Discharge Orders     None        Kathy Villanueva was evaluated in Emergency Department on 11/05/2021 for the symptoms described in the history of present illness. He was evaluated in the context of the global COVID-19 pandemic, which necessitated consideration that the patient might be at risk for infection with the SARS-CoV-2 virus that causes COVID-19. Institutional protocols and algorithms that pertain to the evaluation of patients at risk for COVID-19 are in a state of rapid change based on information released  by regulatory bodies including the CDC and federal and state organizations. These policies and algorithms were followed during the patient's care in the ED.   Note:  This document was prepared using Dragon voice recognition software and may include unintentional dictation errors.    Victorino Dike, FNP 11/05/21 3825    Vladimir Crofts, MD 11/05/21 1150

## 2021-11-03 NOTE — Discharge Instructions (Addendum)
Please follow up with primary care or surgeon for concerns.  Return to the ER for symptoms that change or worsen if unable to schedule an appointment.

## 2021-11-08 DIAGNOSIS — N179 Acute kidney failure, unspecified: Secondary | ICD-10-CM | POA: Diagnosis not present

## 2021-11-08 DIAGNOSIS — N184 Chronic kidney disease, stage 4 (severe): Secondary | ICD-10-CM | POA: Diagnosis not present

## 2021-11-08 DIAGNOSIS — M1030 Gout due to renal impairment, unspecified site: Secondary | ICD-10-CM | POA: Diagnosis not present

## 2021-11-08 DIAGNOSIS — E7800 Pure hypercholesterolemia, unspecified: Secondary | ICD-10-CM | POA: Diagnosis not present

## 2021-11-08 DIAGNOSIS — Z48815 Encounter for surgical aftercare following surgery on the digestive system: Secondary | ICD-10-CM | POA: Diagnosis not present

## 2021-11-08 DIAGNOSIS — E43 Unspecified severe protein-calorie malnutrition: Secondary | ICD-10-CM | POA: Diagnosis not present

## 2021-11-08 DIAGNOSIS — I129 Hypertensive chronic kidney disease with stage 1 through stage 4 chronic kidney disease, or unspecified chronic kidney disease: Secondary | ICD-10-CM | POA: Diagnosis not present

## 2021-11-09 DIAGNOSIS — M1030 Gout due to renal impairment, unspecified site: Secondary | ICD-10-CM | POA: Diagnosis not present

## 2021-11-09 DIAGNOSIS — E43 Unspecified severe protein-calorie malnutrition: Secondary | ICD-10-CM | POA: Diagnosis not present

## 2021-11-09 DIAGNOSIS — N184 Chronic kidney disease, stage 4 (severe): Secondary | ICD-10-CM | POA: Diagnosis not present

## 2021-11-09 DIAGNOSIS — Z48815 Encounter for surgical aftercare following surgery on the digestive system: Secondary | ICD-10-CM | POA: Diagnosis not present

## 2021-11-09 DIAGNOSIS — E7800 Pure hypercholesterolemia, unspecified: Secondary | ICD-10-CM | POA: Diagnosis not present

## 2021-11-09 DIAGNOSIS — I129 Hypertensive chronic kidney disease with stage 1 through stage 4 chronic kidney disease, or unspecified chronic kidney disease: Secondary | ICD-10-CM | POA: Diagnosis not present

## 2021-11-09 DIAGNOSIS — N179 Acute kidney failure, unspecified: Secondary | ICD-10-CM | POA: Diagnosis not present

## 2021-11-13 DIAGNOSIS — K5900 Constipation, unspecified: Secondary | ICD-10-CM | POA: Diagnosis not present

## 2021-11-29 DIAGNOSIS — I499 Cardiac arrhythmia, unspecified: Secondary | ICD-10-CM | POA: Diagnosis not present

## 2021-11-29 DIAGNOSIS — R0602 Shortness of breath: Secondary | ICD-10-CM | POA: Diagnosis not present

## 2021-11-29 DIAGNOSIS — Z7189 Other specified counseling: Secondary | ICD-10-CM | POA: Diagnosis not present

## 2021-11-29 DIAGNOSIS — R062 Wheezing: Secondary | ICD-10-CM | POA: Diagnosis not present

## 2021-11-29 DIAGNOSIS — U071 COVID-19: Secondary | ICD-10-CM | POA: Diagnosis not present

## 2021-11-29 DIAGNOSIS — Z03818 Encounter for observation for suspected exposure to other biological agents ruled out: Secondary | ICD-10-CM | POA: Diagnosis not present

## 2021-12-19 DIAGNOSIS — U071 COVID-19: Secondary | ICD-10-CM | POA: Diagnosis not present

## 2021-12-19 DIAGNOSIS — N1832 Chronic kidney disease, stage 3b: Secondary | ICD-10-CM | POA: Diagnosis not present

## 2022-03-07 DIAGNOSIS — E43 Unspecified severe protein-calorie malnutrition: Secondary | ICD-10-CM | POA: Diagnosis not present

## 2022-03-07 DIAGNOSIS — R5382 Chronic fatigue, unspecified: Secondary | ICD-10-CM | POA: Diagnosis not present

## 2022-03-08 DIAGNOSIS — R634 Abnormal weight loss: Secondary | ICD-10-CM | POA: Diagnosis not present

## 2022-03-08 DIAGNOSIS — R5382 Chronic fatigue, unspecified: Secondary | ICD-10-CM | POA: Diagnosis not present

## 2022-03-08 DIAGNOSIS — E43 Unspecified severe protein-calorie malnutrition: Secondary | ICD-10-CM | POA: Diagnosis not present

## 2022-03-08 DIAGNOSIS — N1832 Chronic kidney disease, stage 3b: Secondary | ICD-10-CM | POA: Diagnosis not present

## 2022-04-04 DIAGNOSIS — R634 Abnormal weight loss: Secondary | ICD-10-CM | POA: Diagnosis not present

## 2022-04-04 DIAGNOSIS — N1832 Chronic kidney disease, stage 3b: Secondary | ICD-10-CM | POA: Diagnosis not present

## 2022-04-04 DIAGNOSIS — R5382 Chronic fatigue, unspecified: Secondary | ICD-10-CM | POA: Diagnosis not present

## 2022-04-04 DIAGNOSIS — E43 Unspecified severe protein-calorie malnutrition: Secondary | ICD-10-CM | POA: Diagnosis not present

## 2022-04-05 DIAGNOSIS — D519 Vitamin B12 deficiency anemia, unspecified: Secondary | ICD-10-CM | POA: Diagnosis not present

## 2022-04-05 DIAGNOSIS — N183 Chronic kidney disease, stage 3 unspecified: Secondary | ICD-10-CM | POA: Diagnosis not present

## 2022-04-05 DIAGNOSIS — R5382 Chronic fatigue, unspecified: Secondary | ICD-10-CM | POA: Diagnosis not present

## 2022-04-05 DIAGNOSIS — D631 Anemia in chronic kidney disease: Secondary | ICD-10-CM | POA: Diagnosis not present

## 2022-04-05 DIAGNOSIS — E43 Unspecified severe protein-calorie malnutrition: Secondary | ICD-10-CM | POA: Diagnosis not present

## 2022-04-05 DIAGNOSIS — Z9049 Acquired absence of other specified parts of digestive tract: Secondary | ICD-10-CM | POA: Diagnosis not present

## 2022-04-05 DIAGNOSIS — I129 Hypertensive chronic kidney disease with stage 1 through stage 4 chronic kidney disease, or unspecified chronic kidney disease: Secondary | ICD-10-CM | POA: Diagnosis not present

## 2022-04-12 DIAGNOSIS — R399 Unspecified symptoms and signs involving the genitourinary system: Secondary | ICD-10-CM | POA: Diagnosis not present

## 2022-05-15 DIAGNOSIS — N1832 Chronic kidney disease, stage 3b: Secondary | ICD-10-CM | POA: Diagnosis not present

## 2022-05-15 DIAGNOSIS — Z0000 Encounter for general adult medical examination without abnormal findings: Secondary | ICD-10-CM | POA: Diagnosis not present

## 2022-05-15 DIAGNOSIS — Z0001 Encounter for general adult medical examination with abnormal findings: Secondary | ICD-10-CM | POA: Diagnosis not present

## 2022-05-15 DIAGNOSIS — Z1389 Encounter for screening for other disorder: Secondary | ICD-10-CM | POA: Diagnosis not present

## 2022-05-15 DIAGNOSIS — I129 Hypertensive chronic kidney disease with stage 1 through stage 4 chronic kidney disease, or unspecified chronic kidney disease: Secondary | ICD-10-CM | POA: Diagnosis not present

## 2022-05-15 DIAGNOSIS — E43 Unspecified severe protein-calorie malnutrition: Secondary | ICD-10-CM | POA: Diagnosis not present

## 2022-05-15 DIAGNOSIS — Z23 Encounter for immunization: Secondary | ICD-10-CM | POA: Diagnosis not present

## 2022-05-15 DIAGNOSIS — E538 Deficiency of other specified B group vitamins: Secondary | ICD-10-CM | POA: Diagnosis not present

## 2022-05-24 DIAGNOSIS — N1832 Chronic kidney disease, stage 3b: Secondary | ICD-10-CM | POA: Diagnosis not present

## 2022-05-24 DIAGNOSIS — E43 Unspecified severe protein-calorie malnutrition: Secondary | ICD-10-CM | POA: Diagnosis not present

## 2022-05-24 DIAGNOSIS — E538 Deficiency of other specified B group vitamins: Secondary | ICD-10-CM | POA: Diagnosis not present

## 2022-05-24 DIAGNOSIS — I10 Essential (primary) hypertension: Secondary | ICD-10-CM | POA: Diagnosis not present

## 2022-05-31 DIAGNOSIS — D631 Anemia in chronic kidney disease: Secondary | ICD-10-CM | POA: Diagnosis not present

## 2022-05-31 DIAGNOSIS — D519 Vitamin B12 deficiency anemia, unspecified: Secondary | ICD-10-CM | POA: Diagnosis not present

## 2022-05-31 DIAGNOSIS — E43 Unspecified severe protein-calorie malnutrition: Secondary | ICD-10-CM | POA: Diagnosis not present

## 2022-05-31 DIAGNOSIS — I129 Hypertensive chronic kidney disease with stage 1 through stage 4 chronic kidney disease, or unspecified chronic kidney disease: Secondary | ICD-10-CM | POA: Diagnosis not present

## 2022-05-31 DIAGNOSIS — N289 Disorder of kidney and ureter, unspecified: Secondary | ICD-10-CM | POA: Diagnosis not present

## 2022-05-31 DIAGNOSIS — Z9049 Acquired absence of other specified parts of digestive tract: Secondary | ICD-10-CM | POA: Diagnosis not present

## 2022-05-31 DIAGNOSIS — N183 Chronic kidney disease, stage 3 unspecified: Secondary | ICD-10-CM | POA: Diagnosis not present

## 2022-06-13 DIAGNOSIS — Z9049 Acquired absence of other specified parts of digestive tract: Secondary | ICD-10-CM | POA: Diagnosis not present

## 2022-06-13 DIAGNOSIS — D631 Anemia in chronic kidney disease: Secondary | ICD-10-CM | POA: Diagnosis not present

## 2022-06-13 DIAGNOSIS — N183 Chronic kidney disease, stage 3 unspecified: Secondary | ICD-10-CM | POA: Diagnosis not present

## 2022-06-13 DIAGNOSIS — D519 Vitamin B12 deficiency anemia, unspecified: Secondary | ICD-10-CM | POA: Diagnosis not present

## 2022-06-13 DIAGNOSIS — N289 Disorder of kidney and ureter, unspecified: Secondary | ICD-10-CM | POA: Diagnosis not present

## 2022-06-13 DIAGNOSIS — I129 Hypertensive chronic kidney disease with stage 1 through stage 4 chronic kidney disease, or unspecified chronic kidney disease: Secondary | ICD-10-CM | POA: Diagnosis not present

## 2022-06-13 DIAGNOSIS — E43 Unspecified severe protein-calorie malnutrition: Secondary | ICD-10-CM | POA: Diagnosis not present

## 2022-07-17 DIAGNOSIS — E43 Unspecified severe protein-calorie malnutrition: Secondary | ICD-10-CM | POA: Diagnosis not present

## 2022-07-17 DIAGNOSIS — E538 Deficiency of other specified B group vitamins: Secondary | ICD-10-CM | POA: Diagnosis not present

## 2022-07-17 DIAGNOSIS — I10 Essential (primary) hypertension: Secondary | ICD-10-CM | POA: Diagnosis not present

## 2022-07-17 DIAGNOSIS — N1832 Chronic kidney disease, stage 3b: Secondary | ICD-10-CM | POA: Diagnosis not present

## 2022-07-25 DIAGNOSIS — M7989 Other specified soft tissue disorders: Secondary | ICD-10-CM | POA: Diagnosis not present

## 2022-07-25 DIAGNOSIS — M25431 Effusion, right wrist: Secondary | ICD-10-CM | POA: Diagnosis not present

## 2022-07-25 DIAGNOSIS — M79641 Pain in right hand: Secondary | ICD-10-CM | POA: Diagnosis not present

## 2022-07-26 DIAGNOSIS — M79641 Pain in right hand: Secondary | ICD-10-CM | POA: Diagnosis not present

## 2022-07-26 DIAGNOSIS — M7989 Other specified soft tissue disorders: Secondary | ICD-10-CM | POA: Diagnosis not present

## 2022-08-16 DIAGNOSIS — E43 Unspecified severe protein-calorie malnutrition: Secondary | ICD-10-CM | POA: Diagnosis not present

## 2022-08-16 DIAGNOSIS — I129 Hypertensive chronic kidney disease with stage 1 through stage 4 chronic kidney disease, or unspecified chronic kidney disease: Secondary | ICD-10-CM | POA: Diagnosis not present

## 2022-08-16 DIAGNOSIS — M1120 Other chondrocalcinosis, unspecified site: Secondary | ICD-10-CM | POA: Diagnosis not present

## 2022-08-16 DIAGNOSIS — N1832 Chronic kidney disease, stage 3b: Secondary | ICD-10-CM | POA: Diagnosis not present

## 2022-08-20 DIAGNOSIS — R29898 Other symptoms and signs involving the musculoskeletal system: Secondary | ICD-10-CM | POA: Diagnosis not present

## 2022-08-20 DIAGNOSIS — Y92009 Unspecified place in unspecified non-institutional (private) residence as the place of occurrence of the external cause: Secondary | ICD-10-CM | POA: Diagnosis not present

## 2022-08-20 DIAGNOSIS — W010XXA Fall on same level from slipping, tripping and stumbling without subsequent striking against object, initial encounter: Secondary | ICD-10-CM | POA: Diagnosis not present

## 2022-08-22 DIAGNOSIS — I10 Essential (primary) hypertension: Secondary | ICD-10-CM | POA: Diagnosis not present

## 2022-08-22 DIAGNOSIS — N1832 Chronic kidney disease, stage 3b: Secondary | ICD-10-CM | POA: Diagnosis not present

## 2022-08-22 DIAGNOSIS — E538 Deficiency of other specified B group vitamins: Secondary | ICD-10-CM | POA: Diagnosis not present

## 2022-08-22 DIAGNOSIS — E43 Unspecified severe protein-calorie malnutrition: Secondary | ICD-10-CM | POA: Diagnosis not present

## 2022-09-07 ENCOUNTER — Encounter: Payer: Self-pay | Admitting: Intensive Care

## 2022-09-07 ENCOUNTER — Emergency Department
Admission: EM | Admit: 2022-09-07 | Discharge: 2022-09-07 | Disposition: A | Discharge status: Home / Self Care | Payer: Medicare HMO | Attending: Emergency Medicine | Admitting: Emergency Medicine

## 2022-09-07 ENCOUNTER — Other Ambulatory Visit: Payer: Self-pay

## 2022-09-07 DIAGNOSIS — I10 Essential (primary) hypertension: Secondary | ICD-10-CM | POA: Diagnosis not present

## 2022-09-07 DIAGNOSIS — R102 Pelvic and perineal pain: Secondary | ICD-10-CM | POA: Insufficient documentation

## 2022-09-07 LAB — COMPREHENSIVE METABOLIC PANEL
ALT: 10 U/L (ref 0–44)
AST: 22 U/L (ref 15–41)
Albumin: 2.8 g/dL — ABNORMAL LOW (ref 3.5–5.0)
Alkaline Phosphatase: 59 U/L (ref 38–126)
Anion gap: 9 (ref 5–15)
BUN: 26 mg/dL — ABNORMAL HIGH (ref 8–23)
CO2: 22 mmol/L (ref 22–32)
Calcium: 8.4 mg/dL — ABNORMAL LOW (ref 8.9–10.3)
Chloride: 110 mmol/L (ref 98–111)
Creatinine, Ser: 1.26 mg/dL — ABNORMAL HIGH (ref 0.44–1.00)
GFR, Estimated: 39 mL/min — ABNORMAL LOW (ref >60)
Glucose, Bld: 134 mg/dL — ABNORMAL HIGH (ref 70–99)
Potassium: 3.6 mmol/L (ref 3.5–5.1)
Sodium: 141 mmol/L (ref 135–145)
Total Bilirubin: 0.6 mg/dL (ref 0.3–1.2)
Total Protein: 5.7 g/dL — ABNORMAL LOW (ref 6.5–8.1)

## 2022-09-07 LAB — CBC WITH DIFFERENTIAL/PLATELET
Abs Immature Granulocytes: 0.03 10*3/uL (ref 0.00–0.07)
Basophils Absolute: 0 10*3/uL (ref 0.0–0.1)
Basophils Relative: 0 %
Eosinophils Absolute: 0.3 10*3/uL (ref 0.0–0.5)
Eosinophils Relative: 3 %
HCT: 36.5 % (ref 36.0–46.0)
Hemoglobin: 11 g/dL — ABNORMAL LOW (ref 12.0–15.0)
Immature Granulocytes: 0 %
Lymphocytes Relative: 12 %
Lymphs Abs: 1.1 10*3/uL (ref 0.7–4.0)
MCH: 29.2 pg (ref 26.0–34.0)
MCHC: 30.1 g/dL (ref 30.0–36.0)
MCV: 96.8 fL (ref 80.0–100.0)
Monocytes Absolute: 0.6 10*3/uL (ref 0.1–1.0)
Monocytes Relative: 6 %
Neutro Abs: 7.5 10*3/uL (ref 1.7–7.7)
Neutrophils Relative %: 79 %
Platelets: 128 10*3/uL — ABNORMAL LOW (ref 150–400)
RBC: 3.77 MIL/uL — ABNORMAL LOW (ref 3.87–5.11)
RDW: 15.3 % (ref 11.5–15.5)
WBC: 9.5 10*3/uL (ref 4.0–10.5)
nRBC: 0 % (ref 0.0–0.2)

## 2022-09-07 LAB — URINALYSIS, ROUTINE W REFLEX MICROSCOPIC
Bacteria, UA: NONE SEEN
Bilirubin Urine: NEGATIVE
Glucose, UA: NEGATIVE mg/dL
Ketones, ur: NEGATIVE mg/dL
Leukocytes,Ua: NEGATIVE
Nitrite: NEGATIVE
Protein, ur: 100 mg/dL — AB
Specific Gravity, Urine: 1.017 (ref 1.005–1.030)
pH: 5 (ref 5.0–8.0)

## 2022-09-07 NOTE — ED Triage Notes (Addendum)
Patient reports after being cleaned this AM, her family used wipes and lotion on her vagina and she has been burning ever since.   Patients family reports social work needs to be involved with patient for 24/7 care. Patient has her own apartment and two of her daughters go to her apartment periodically. Family reports patient is left for hours in chair with brief and has to wait until someone shows up to clean her. Patient is left in recliner during day all day and bed all night by herself. Patient is non ambulatory.

## 2022-09-07 NOTE — ED Provider Notes (Signed)
[facility] Provider Note    Event Date/Time   First MD Initiated Contact with Patient 09/07/22 1710     (approximate)   History   Chief Complaint: Vaginal Pain   HPI  Kathy Villanueva is a 88 y.o. adult with a past history of kidney stones, GERD, hypertension who is brought to the ED due to vaginal pain.  Patient reports she still having some pain although it is much better.  Pain started after having her perineum cleaned with some sort of cleaning wipe which caused immediate burning sensation.  Denies any other trauma.  Denies dysuria.  No falls or vomiting or fever.  Patient's daughter-in-law at bedside notes that she does not feel the patient is adequately cared for.  She describes that the patient is nonambulatory at home, and his left alone for hours at a time, having to stay in a chair or in bed without the ability to get up or care for herself in any way.  She is often sitting in soiled adult diapers for prolonged period of time.  She indicates that the patient's children will not allow her to be placed in a nursing facility.      Physical Exam   Triage Vital Signs: ED Triage Vitals  Enc Vitals Group     BP 09/07/22 1636 (!) 153/70     Pulse Rate 09/07/22 1636 74     Resp 09/07/22 1636 16     Temp 09/07/22 1636 98.2 F (36.8 C)     Temp Source 09/07/22 1636 Oral     SpO2 09/07/22 1636 94 %     Weight 09/07/22 1638 105 lb (47.6 kg)     Height 09/07/22 1638 5\' 8"  (1.727 m)     Head Circumference --      Peak Flow --      Pain Score 09/07/22 1637 9     Pain Loc --      Pain Edu? --      Excl. in [place]? --     Most recent vital signs: Vitals:   09/07/22 1900 09/07/22 1930  BP: (!) 178/86 (!) 180/85  Pulse: 68 67  Resp: 18 17  Temp:    SpO2: 94% 95%    General: Awake, no distress.  CV:  Good peripheral perfusion.  Regular rate Resp:  Normal effort.  Clear to auscultation bilaterally Abd:  No distention.  Soft with mild suprapubic  tenderness Other:  No lower extremity edema.  Moist oral mucosa.  Perineum examined with nurse Melanie at bedside, appears normal.  There was stool in the patient's diaper which was cleaned.   ED Results / Procedures / Treatments   Labs (all labs ordered are listed, but only abnormal results are displayed) Labs Reviewed  CBC WITH DIFFERENTIAL/PLATELET - Abnormal; Notable for the following components:      Result Value   RBC 3.77 (*)    Hemoglobin 11.0 (*)    Platelets 128 (*)    All other components within normal limits  COMPREHENSIVE METABOLIC PANEL - Abnormal; Notable for the following components:   Glucose, Bld 134 (*)    BUN 26 (*)    Creatinine, Ser 1.26 (*)    Calcium 8.4 (*)    Total Protein 5.7 (*)    Albumin 2.8 (*)    GFR, Estimated 39 (*)    All other components within normal limits  URINALYSIS, ROUTINE W REFLEX MICROSCOPIC - Abnormal; Notable for the following components:  Color, Urine YELLOW (*)    APPearance HAZY (*)    Hgb urine dipstick SMALL (*)    Protein, ur 100 (*)    All other components within normal limits  URINE CULTURE     EKG    RADIOLOGY    PROCEDURES:  Procedures   MEDICATIONS ORDERED IN ED: Medications - No data to display   IMPRESSION / MDM / [place] / ED COURSE  I reviewed the triage vital signs and the nursing notes.                              Differential diagnosis includes, but is not limited to, AKI, anemia, electrolyte abnormality, UTI  Patient's presentation is most consistent with acute presentation with potential threat to life or bodily function.  Patient brought to the ED due to vaginal pain which seems to be due to contact irritation from cleaning wipe.  On exam, perineum is normal without bleeding, trauma, or inflammatory changes.  Nontender.  Patient's urinalysis and serum labs are all unremarkable.  GFR is at or better than baseline in the context of CKD.  We will initiate Adult Protective  Services referral for home evaluation.  ----------------------------------------- 7:58 PM on 09/07/2022 ----------------------------------------- I spoke with DSS Representative Earl Lagos who initiated referral for APS evaluation.  Patient is medically stable for discharge home at this time       FINAL CLINICAL IMPRESSION(S) / ED DIAGNOSES   Final diagnoses:  Vaginal pain     Rx / DC Orders   ED Discharge Orders     None        Note:  This document was prepared using Dragon voice recognition software and may include unintentional dictation errors.   Carrie Mew, MD 09/07/22 [phone]

## 2022-09-09 LAB — URINE CULTURE: Culture: NO GROWTH

## 2022-09-10 DIAGNOSIS — E43 Unspecified severe protein-calorie malnutrition: Secondary | ICD-10-CM | POA: Diagnosis not present

## 2022-09-10 DIAGNOSIS — R82998 Other abnormal findings in urine: Secondary | ICD-10-CM | POA: Diagnosis not present

## 2022-09-10 DIAGNOSIS — R300 Dysuria: Secondary | ICD-10-CM | POA: Diagnosis not present

## 2022-09-10 DIAGNOSIS — R531 Weakness: Secondary | ICD-10-CM | POA: Diagnosis not present

## 2022-09-10 DIAGNOSIS — R8290 Unspecified abnormal findings in urine: Secondary | ICD-10-CM | POA: Diagnosis not present

## 2022-09-10 DIAGNOSIS — R102 Pelvic and perineal pain: Secondary | ICD-10-CM | POA: Diagnosis not present

## 2022-09-12 ENCOUNTER — Telehealth: Payer: Self-pay

## 2022-09-12 NOTE — Telephone Encounter (Signed)
     Patient  visit on 9/30  at [facility]  Have you been able to follow up with your primary care physician? yes  The patient was or was not able to obtain any needed medicine or equipment. yes  Are there diet recommendations that you are having difficulty following? na  Patient expresses understanding of discharge instructions and education provided has no other needs at this time.  yes     Kathy Villanueva [facility] Guide, [place], Care Management  [phone] [address], [place], [place] [address] Phone: [phone] Email: [email or web address]    

## 2022-10-14 DIAGNOSIS — N1832 Chronic kidney disease, stage 3b: Secondary | ICD-10-CM | POA: Diagnosis not present

## 2022-10-14 DIAGNOSIS — R29898 Other symptoms and signs involving the musculoskeletal system: Secondary | ICD-10-CM | POA: Diagnosis not present

## 2022-10-21 DIAGNOSIS — E538 Deficiency of other specified B group vitamins: Secondary | ICD-10-CM | POA: Diagnosis not present

## 2022-10-21 DIAGNOSIS — E43 Unspecified severe protein-calorie malnutrition: Secondary | ICD-10-CM | POA: Diagnosis not present

## 2022-10-21 DIAGNOSIS — N183 Chronic kidney disease, stage 3 unspecified: Secondary | ICD-10-CM | POA: Diagnosis not present

## 2022-10-21 DIAGNOSIS — D631 Anemia in chronic kidney disease: Secondary | ICD-10-CM | POA: Diagnosis not present

## 2022-10-21 DIAGNOSIS — I129 Hypertensive chronic kidney disease with stage 1 through stage 4 chronic kidney disease, or unspecified chronic kidney disease: Secondary | ICD-10-CM | POA: Diagnosis not present

## 2022-10-21 DIAGNOSIS — Z9181 History of falling: Secondary | ICD-10-CM | POA: Diagnosis not present

## 2022-10-25 DIAGNOSIS — I129 Hypertensive chronic kidney disease with stage 1 through stage 4 chronic kidney disease, or unspecified chronic kidney disease: Secondary | ICD-10-CM | POA: Diagnosis not present

## 2022-10-25 DIAGNOSIS — D631 Anemia in chronic kidney disease: Secondary | ICD-10-CM | POA: Diagnosis not present

## 2022-10-25 DIAGNOSIS — E538 Deficiency of other specified B group vitamins: Secondary | ICD-10-CM | POA: Diagnosis not present

## 2022-10-25 DIAGNOSIS — E43 Unspecified severe protein-calorie malnutrition: Secondary | ICD-10-CM | POA: Diagnosis not present

## 2022-10-25 DIAGNOSIS — Z9181 History of falling: Secondary | ICD-10-CM | POA: Diagnosis not present

## 2022-10-25 DIAGNOSIS — N183 Chronic kidney disease, stage 3 unspecified: Secondary | ICD-10-CM | POA: Diagnosis not present

## 2022-10-28 DIAGNOSIS — N1832 Chronic kidney disease, stage 3b: Secondary | ICD-10-CM | POA: Diagnosis not present

## 2022-10-28 DIAGNOSIS — I10 Essential (primary) hypertension: Secondary | ICD-10-CM | POA: Diagnosis not present

## 2022-11-26 DIAGNOSIS — Z20822 Contact with and (suspected) exposure to covid-19: Secondary | ICD-10-CM | POA: Diagnosis not present

## 2022-11-26 DIAGNOSIS — R0602 Shortness of breath: Secondary | ICD-10-CM | POA: Diagnosis not present

## 2022-11-26 DIAGNOSIS — I130 Hypertensive heart and chronic kidney disease with heart failure and stage 1 through stage 4 chronic kidney disease, or unspecified chronic kidney disease: Secondary | ICD-10-CM | POA: Diagnosis not present

## 2022-11-26 DIAGNOSIS — R079 Chest pain, unspecified: Secondary | ICD-10-CM | POA: Diagnosis not present

## 2022-11-26 DIAGNOSIS — R2681 Unsteadiness on feet: Secondary | ICD-10-CM | POA: Diagnosis not present

## 2022-11-26 DIAGNOSIS — Z7409 Other reduced mobility: Secondary | ICD-10-CM | POA: Diagnosis not present

## 2022-11-26 DIAGNOSIS — E43 Unspecified severe protein-calorie malnutrition: Secondary | ICD-10-CM | POA: Diagnosis not present

## 2022-11-26 DIAGNOSIS — I77810 Thoracic aortic ectasia: Secondary | ICD-10-CM | POA: Diagnosis not present

## 2022-11-26 DIAGNOSIS — R609 Edema, unspecified: Secondary | ICD-10-CM | POA: Diagnosis not present

## 2022-11-26 DIAGNOSIS — I771 Stricture of artery: Secondary | ICD-10-CM | POA: Diagnosis not present

## 2022-11-26 DIAGNOSIS — J9859 Other diseases of mediastinum, not elsewhere classified: Secondary | ICD-10-CM | POA: Diagnosis not present

## 2022-11-26 DIAGNOSIS — J439 Emphysema, unspecified: Secondary | ICD-10-CM | POA: Diagnosis not present

## 2022-11-26 DIAGNOSIS — I5020 Unspecified systolic (congestive) heart failure: Secondary | ICD-10-CM | POA: Diagnosis not present

## 2022-11-26 DIAGNOSIS — N3000 Acute cystitis without hematuria: Secondary | ICD-10-CM | POA: Diagnosis not present

## 2022-11-26 DIAGNOSIS — N183 Chronic kidney disease, stage 3 unspecified: Secondary | ICD-10-CM | POA: Diagnosis not present

## 2022-11-26 DIAGNOSIS — R1013 Epigastric pain: Secondary | ICD-10-CM | POA: Diagnosis not present

## 2022-11-26 DIAGNOSIS — R410 Disorientation, unspecified: Secondary | ICD-10-CM | POA: Diagnosis not present

## 2022-11-26 DIAGNOSIS — R0789 Other chest pain: Secondary | ICD-10-CM | POA: Diagnosis not present

## 2022-11-27 DIAGNOSIS — E46 Unspecified protein-calorie malnutrition: Secondary | ICD-10-CM | POA: Diagnosis not present

## 2022-11-27 DIAGNOSIS — G809 Cerebral palsy, unspecified: Secondary | ICD-10-CM | POA: Diagnosis not present

## 2022-11-27 DIAGNOSIS — D519 Vitamin B12 deficiency anemia, unspecified: Secondary | ICD-10-CM | POA: Diagnosis not present

## 2022-11-27 DIAGNOSIS — K6389 Other specified diseases of intestine: Secondary | ICD-10-CM | POA: Diagnosis not present

## 2022-11-27 DIAGNOSIS — R29818 Other symptoms and signs involving the nervous system: Secondary | ICD-10-CM | POA: Diagnosis not present

## 2022-11-27 DIAGNOSIS — R809 Proteinuria, unspecified: Secondary | ICD-10-CM | POA: Diagnosis not present

## 2022-11-27 DIAGNOSIS — J329 Chronic sinusitis, unspecified: Secondary | ICD-10-CM | POA: Diagnosis not present

## 2022-11-27 DIAGNOSIS — I509 Heart failure, unspecified: Secondary | ICD-10-CM | POA: Diagnosis not present

## 2022-11-27 DIAGNOSIS — J439 Emphysema, unspecified: Secondary | ICD-10-CM | POA: Diagnosis not present

## 2022-11-27 DIAGNOSIS — H5702 Anisocoria: Secondary | ICD-10-CM | POA: Diagnosis not present

## 2022-11-27 DIAGNOSIS — E8770 Fluid overload, unspecified: Secondary | ICD-10-CM | POA: Diagnosis not present

## 2022-11-27 DIAGNOSIS — R4182 Altered mental status, unspecified: Secondary | ICD-10-CM | POA: Diagnosis not present

## 2022-11-27 DIAGNOSIS — R109 Unspecified abdominal pain: Secondary | ICD-10-CM | POA: Diagnosis not present

## 2022-11-27 DIAGNOSIS — R778 Other specified abnormalities of plasma proteins: Secondary | ICD-10-CM | POA: Diagnosis not present

## 2022-11-27 DIAGNOSIS — I491 Atrial premature depolarization: Secondary | ICD-10-CM | POA: Diagnosis not present

## 2022-11-28 DIAGNOSIS — K219 Gastro-esophageal reflux disease without esophagitis: Secondary | ICD-10-CM | POA: Diagnosis not present

## 2022-11-28 DIAGNOSIS — E46 Unspecified protein-calorie malnutrition: Secondary | ICD-10-CM | POA: Diagnosis not present

## 2022-11-28 DIAGNOSIS — N179 Acute kidney failure, unspecified: Secondary | ICD-10-CM | POA: Diagnosis not present

## 2022-11-28 DIAGNOSIS — Z79899 Other long term (current) drug therapy: Secondary | ICD-10-CM | POA: Diagnosis not present

## 2022-11-28 DIAGNOSIS — K5900 Constipation, unspecified: Secondary | ICD-10-CM | POA: Diagnosis not present

## 2022-11-28 DIAGNOSIS — G928 Other toxic encephalopathy: Secondary | ICD-10-CM | POA: Diagnosis not present

## 2022-11-28 DIAGNOSIS — G809 Cerebral palsy, unspecified: Secondary | ICD-10-CM | POA: Diagnosis not present

## 2022-11-28 DIAGNOSIS — D519 Vitamin B12 deficiency anemia, unspecified: Secondary | ICD-10-CM | POA: Diagnosis not present

## 2022-11-28 DIAGNOSIS — I5020 Unspecified systolic (congestive) heart failure: Secondary | ICD-10-CM | POA: Diagnosis not present

## 2022-11-29 DIAGNOSIS — D649 Anemia, unspecified: Secondary | ICD-10-CM | POA: Diagnosis not present

## 2022-11-29 DIAGNOSIS — N3000 Acute cystitis without hematuria: Secondary | ICD-10-CM | POA: Diagnosis not present

## 2022-11-29 DIAGNOSIS — E43 Unspecified severe protein-calorie malnutrition: Secondary | ICD-10-CM | POA: Diagnosis not present

## 2022-11-29 DIAGNOSIS — R778 Other specified abnormalities of plasma proteins: Secondary | ICD-10-CM | POA: Diagnosis not present

## 2022-11-29 DIAGNOSIS — N183 Chronic kidney disease, stage 3 unspecified: Secondary | ICD-10-CM | POA: Diagnosis not present

## 2022-11-29 DIAGNOSIS — I130 Hypertensive heart and chronic kidney disease with heart failure and stage 1 through stage 4 chronic kidney disease, or unspecified chronic kidney disease: Secondary | ICD-10-CM | POA: Diagnosis not present

## 2022-11-29 DIAGNOSIS — R601 Generalized edema: Secondary | ICD-10-CM | POA: Diagnosis not present

## 2022-11-29 DIAGNOSIS — R410 Disorientation, unspecified: Secondary | ICD-10-CM | POA: Diagnosis not present

## 2022-11-29 DIAGNOSIS — R0789 Other chest pain: Secondary | ICD-10-CM | POA: Diagnosis not present

## 2022-12-02 ENCOUNTER — Inpatient Hospital Stay: Payer: Medicare HMO

## 2022-12-02 ENCOUNTER — Inpatient Hospital Stay
Admission: EM | Admit: 2022-12-02 | Discharge: 2022-12-07 | DRG: 193 | Disposition: A | Discharge status: Home Health | Payer: Medicare HMO | Attending: Internal Medicine | Admitting: Internal Medicine

## 2022-12-02 ENCOUNTER — Emergency Department: Payer: Medicare HMO

## 2022-12-02 ENCOUNTER — Other Ambulatory Visit: Payer: Self-pay

## 2022-12-02 ENCOUNTER — Encounter: Payer: Self-pay | Admitting: Internal Medicine

## 2022-12-02 DIAGNOSIS — I5031 Acute diastolic (congestive) heart failure: Secondary | ICD-10-CM | POA: Diagnosis not present

## 2022-12-02 DIAGNOSIS — B9620 Unspecified Escherichia coli [E. coli] as the cause of diseases classified elsewhere: Secondary | ICD-10-CM | POA: Diagnosis present

## 2022-12-02 DIAGNOSIS — R4182 Altered mental status, unspecified: Secondary | ICD-10-CM | POA: Diagnosis not present

## 2022-12-02 DIAGNOSIS — R0602 Shortness of breath: Secondary | ICD-10-CM | POA: Diagnosis not present

## 2022-12-02 DIAGNOSIS — Z7401 Bed confinement status: Secondary | ICD-10-CM

## 2022-12-02 DIAGNOSIS — J9601 Acute respiratory failure with hypoxia: Secondary | ICD-10-CM | POA: Diagnosis not present

## 2022-12-02 DIAGNOSIS — I160 Hypertensive urgency: Secondary | ICD-10-CM | POA: Diagnosis not present

## 2022-12-02 DIAGNOSIS — R0603 Acute respiratory distress: Secondary | ICD-10-CM | POA: Diagnosis not present

## 2022-12-02 DIAGNOSIS — G9341 Metabolic encephalopathy: Secondary | ICD-10-CM | POA: Diagnosis present

## 2022-12-02 DIAGNOSIS — D649 Anemia, unspecified: Secondary | ICD-10-CM | POA: Diagnosis present

## 2022-12-02 DIAGNOSIS — E7800 Pure hypercholesterolemia, unspecified: Secondary | ICD-10-CM | POA: Diagnosis present

## 2022-12-02 DIAGNOSIS — A419 Sepsis, unspecified organism: Secondary | ICD-10-CM | POA: Diagnosis not present

## 2022-12-02 DIAGNOSIS — M109 Gout, unspecified: Secondary | ICD-10-CM | POA: Diagnosis present

## 2022-12-02 DIAGNOSIS — F0390 Unspecified dementia without behavioral disturbance: Secondary | ICD-10-CM | POA: Diagnosis present

## 2022-12-02 DIAGNOSIS — E43 Unspecified severe protein-calorie malnutrition: Secondary | ICD-10-CM | POA: Diagnosis not present

## 2022-12-02 DIAGNOSIS — N1832 Chronic kidney disease, stage 3b: Secondary | ICD-10-CM | POA: Diagnosis present

## 2022-12-02 DIAGNOSIS — I5022 Chronic systolic (congestive) heart failure: Secondary | ICD-10-CM | POA: Diagnosis not present

## 2022-12-02 DIAGNOSIS — Z87442 Personal history of urinary calculi: Secondary | ICD-10-CM | POA: Diagnosis not present

## 2022-12-02 DIAGNOSIS — F03918 Unspecified dementia, unspecified severity, with other behavioral disturbance: Secondary | ICD-10-CM | POA: Diagnosis not present

## 2022-12-02 DIAGNOSIS — I130 Hypertensive heart and chronic kidney disease with heart failure and stage 1 through stage 4 chronic kidney disease, or unspecified chronic kidney disease: Secondary | ICD-10-CM | POA: Diagnosis present

## 2022-12-02 DIAGNOSIS — Z66 Do not resuscitate: Secondary | ICD-10-CM | POA: Diagnosis present

## 2022-12-02 DIAGNOSIS — Z90710 Acquired absence of both cervix and uterus: Secondary | ICD-10-CM | POA: Diagnosis not present

## 2022-12-02 DIAGNOSIS — I2699 Other pulmonary embolism without acute cor pulmonale: Secondary | ICD-10-CM | POA: Diagnosis present

## 2022-12-02 DIAGNOSIS — I10 Essential (primary) hypertension: Secondary | ICD-10-CM | POA: Diagnosis present

## 2022-12-02 DIAGNOSIS — N179 Acute kidney failure, unspecified: Secondary | ICD-10-CM | POA: Diagnosis not present

## 2022-12-02 DIAGNOSIS — R109 Unspecified abdominal pain: Secondary | ICD-10-CM | POA: Diagnosis not present

## 2022-12-02 DIAGNOSIS — J101 Influenza due to other identified influenza virus with other respiratory manifestations: Principal | ICD-10-CM | POA: Diagnosis present

## 2022-12-02 DIAGNOSIS — R7989 Other specified abnormal findings of blood chemistry: Secondary | ICD-10-CM | POA: Diagnosis not present

## 2022-12-02 DIAGNOSIS — I712 Thoracic aortic aneurysm, without rupture, unspecified: Secondary | ICD-10-CM | POA: Diagnosis present

## 2022-12-02 DIAGNOSIS — R079 Chest pain, unspecified: Secondary | ICD-10-CM | POA: Insufficient documentation

## 2022-12-02 DIAGNOSIS — K219 Gastro-esophageal reflux disease without esophagitis: Secondary | ICD-10-CM | POA: Diagnosis present

## 2022-12-02 DIAGNOSIS — J9690 Respiratory failure, unspecified, unspecified whether with hypoxia or hypercapnia: Secondary | ICD-10-CM | POA: Diagnosis not present

## 2022-12-02 DIAGNOSIS — Z1152 Encounter for screening for COVID-19: Secondary | ICD-10-CM | POA: Diagnosis not present

## 2022-12-02 DIAGNOSIS — Z79899 Other long term (current) drug therapy: Secondary | ICD-10-CM

## 2022-12-02 DIAGNOSIS — Z681 Body mass index (BMI) 19 or less, adult: Secondary | ICD-10-CM

## 2022-12-02 DIAGNOSIS — I5A Non-ischemic myocardial injury (non-traumatic): Secondary | ICD-10-CM | POA: Diagnosis not present

## 2022-12-02 DIAGNOSIS — R9431 Abnormal electrocardiogram [ECG] [EKG]: Secondary | ICD-10-CM | POA: Diagnosis not present

## 2022-12-02 DIAGNOSIS — E860 Dehydration: Secondary | ICD-10-CM | POA: Diagnosis present

## 2022-12-02 DIAGNOSIS — D631 Anemia in chronic kidney disease: Secondary | ICD-10-CM | POA: Diagnosis present

## 2022-12-02 DIAGNOSIS — R0902 Hypoxemia: Secondary | ICD-10-CM | POA: Diagnosis not present

## 2022-12-02 DIAGNOSIS — I700 Atherosclerosis of aorta: Secondary | ICD-10-CM | POA: Diagnosis not present

## 2022-12-02 DIAGNOSIS — Z87440 Personal history of urinary (tract) infections: Secondary | ICD-10-CM

## 2022-12-02 HISTORY — DX: Chronic systolic (congestive) heart failure: I50.22

## 2022-12-02 LAB — BLOOD GAS, ARTERIAL
Acid-base deficit: 0.8 mmol/L (ref 0.0–2.0)
Bicarbonate: 23.5 mmol/L (ref 20.0–28.0)
Delivery systems: POSITIVE
O2 Saturation: 98.9 %
Patient temperature: 37
pCO2 arterial: 37 mmHg (ref 32–48)
pH, Arterial: 7.41 (ref 7.35–7.45)
pO2, Arterial: 346 mmHg — ABNORMAL HIGH (ref 83–108)

## 2022-12-02 LAB — COMPREHENSIVE METABOLIC PANEL
ALT: 20 U/L (ref 0–44)
AST: 49 U/L — ABNORMAL HIGH (ref 15–41)
Albumin: 2.9 g/dL — ABNORMAL LOW (ref 3.5–5.0)
Alkaline Phosphatase: 75 U/L (ref 38–126)
Anion gap: 9 (ref 5–15)
BUN: 41 mg/dL — ABNORMAL HIGH (ref 8–23)
CO2: 22 mmol/L (ref 22–32)
Calcium: 8.2 mg/dL — ABNORMAL LOW (ref 8.9–10.3)
Chloride: 110 mmol/L (ref 98–111)
Creatinine, Ser: 1.7 mg/dL — ABNORMAL HIGH (ref 0.44–1.00)
GFR, Estimated: 27 mL/min — ABNORMAL LOW (ref >60)
Glucose, Bld: 86 mg/dL (ref 70–99)
Potassium: 4.6 mmol/L (ref 3.5–5.1)
Sodium: 141 mmol/L (ref 135–145)
Total Bilirubin: 0.8 mg/dL (ref 0.3–1.2)
Total Protein: 5.9 g/dL — ABNORMAL LOW (ref 6.5–8.1)

## 2022-12-02 LAB — RESP PANEL BY RT-PCR (RSV, FLU A&B, COVID)  RVPGX2
Influenza A by PCR: POSITIVE — AB
Influenza B by PCR: NEGATIVE
Resp Syncytial Virus by PCR: NEGATIVE
SARS Coronavirus 2 by RT PCR: NEGATIVE

## 2022-12-02 LAB — CBC WITH DIFFERENTIAL/PLATELET
Abs Immature Granulocytes: 0.02 10*3/uL (ref 0.00–0.07)
Basophils Absolute: 0.1 10*3/uL (ref 0.0–0.1)
Basophils Relative: 1 %
Eosinophils Absolute: 0.1 10*3/uL (ref 0.0–0.5)
Eosinophils Relative: 1 %
HCT: 32.5 % — ABNORMAL LOW (ref 36.0–46.0)
Hemoglobin: 9.4 g/dL — ABNORMAL LOW (ref 12.0–15.0)
Immature Granulocytes: 0 %
Lymphocytes Relative: 46 %
Lymphs Abs: 3.6 10*3/uL (ref 0.7–4.0)
MCH: 27.8 pg (ref 26.0–34.0)
MCHC: 28.9 g/dL — ABNORMAL LOW (ref 30.0–36.0)
MCV: 96.2 fL (ref 80.0–100.0)
Monocytes Absolute: 0.8 10*3/uL (ref 0.1–1.0)
Monocytes Relative: 10 %
Neutro Abs: 3.4 10*3/uL (ref 1.7–7.7)
Neutrophils Relative %: 42 %
Platelets: 260 10*3/uL (ref 150–400)
RBC: 3.38 MIL/uL — ABNORMAL LOW (ref 3.87–5.11)
RDW: 16.8 % — ABNORMAL HIGH (ref 11.5–15.5)
WBC: 8 10*3/uL (ref 4.0–10.5)
nRBC: 0 % (ref 0.0–0.2)

## 2022-12-02 LAB — D-DIMER, QUANTITATIVE: D-Dimer, Quant: 5.43 ug/mL-FEU — ABNORMAL HIGH (ref 0.00–0.50)

## 2022-12-02 LAB — TROPONIN I (HIGH SENSITIVITY)
Troponin I (High Sensitivity): 22 ng/L — ABNORMAL HIGH (ref <18)
Troponin I (High Sensitivity): 20 ng/L — ABNORMAL HIGH (ref <18)
Troponin I (High Sensitivity): 24 ng/L — ABNORMAL HIGH (ref <18)

## 2022-12-02 LAB — LACTIC ACID, PLASMA: Lactic Acid, Venous: 1.7 mmol/L (ref 0.5–1.9)

## 2022-12-02 LAB — PROTIME-INR
INR: 1.1 (ref 0.8–1.2)
Prothrombin Time: 13.9 seconds (ref 11.4–15.2)

## 2022-12-02 LAB — BRAIN NATRIURETIC PEPTIDE: B Natriuretic Peptide: 225.8 pg/mL — ABNORMAL HIGH (ref 0.0–100.0)

## 2022-12-02 LAB — PROCALCITONIN: Procalcitonin: 0.52 ng/mL

## 2022-12-02 MED ORDER — VITAMIN B-12 1000 MCG PO TABS
1000.0000 ug | ORAL_TABLET | Freq: Every day | ORAL | Status: DC
  Administered 2022-12-02 – 2022-12-07 (×6): 1000 ug via ORAL
  Filled 2022-12-02: qty 2
  Filled 2022-12-02 (×4): qty 1
  Filled 2022-12-02: qty 2

## 2022-12-02 MED ORDER — IPRATROPIUM-ALBUTEROL 0.5-2.5 (3) MG/3ML IN SOLN
3.0000 mL | Freq: Once | RESPIRATORY_TRACT | Status: AC
  Administered 2022-12-02: 3 mL via RESPIRATORY_TRACT
  Filled 2022-12-02: qty 3

## 2022-12-02 MED ORDER — ENSURE ENLIVE PO LIQD
237.0000 mL | Freq: Two times a day (BID) | ORAL | Status: DC
  Administered 2022-12-03 – 2022-12-07 (×8): 237 mL via ORAL

## 2022-12-02 MED ORDER — POLYETHYLENE GLYCOL 3350 17 G PO PACK: 17.0000 g | PACK | Freq: Every day | ORAL | Status: DC | PRN

## 2022-12-02 MED ORDER — CARVEDILOL 3.125 MG PO TABS
3.1250 mg | ORAL_TABLET | Freq: Two times a day (BID) | ORAL | Status: DC
  Administered 2022-12-02 – 2022-12-07 (×9): 3.125 mg via ORAL
  Filled 2022-12-02 (×10): qty 1

## 2022-12-02 MED ORDER — ALBUTEROL SULFATE (2.5 MG/3ML) 0.083% IN NEBU: 2.5000 mg | INHALATION_SOLUTION | RESPIRATORY_TRACT | Status: DC | PRN

## 2022-12-02 MED ORDER — OSELTAMIVIR PHOSPHATE 30 MG PO CAPS
30.0000 mg | ORAL_CAPSULE | Freq: Once | ORAL | Status: AC
  Administered 2022-12-02: 30 mg via ORAL
  Filled 2022-12-02: qty 1

## 2022-12-02 MED ORDER — SODIUM CHLORIDE 0.9 % IV BOLUS
500.0000 mL | Freq: Once | INTRAVENOUS | Status: AC
  Administered 2022-12-02: 500 mL via INTRAVENOUS

## 2022-12-02 MED ORDER — OSELTAMIVIR PHOSPHATE 30 MG PO CAPS
30.0000 mg | ORAL_CAPSULE | Freq: Every day | ORAL | Status: AC
  Administered 2022-12-03 – 2022-12-06 (×4): 30 mg via ORAL
  Filled 2022-12-02 (×4): qty 1

## 2022-12-02 MED ORDER — ENOXAPARIN SODIUM 30 MG/0.3ML IJ SOSY
30.0000 mg | PREFILLED_SYRINGE | INTRAMUSCULAR | Status: DC
  Administered 2022-12-03: 30 mg via SUBCUTANEOUS
  Filled 2022-12-02: qty 0.3

## 2022-12-02 MED ORDER — ACETAMINOPHEN 650 MG RE SUPP: 650.0000 mg | Freq: Four times a day (QID) | RECTAL | Status: DC | PRN

## 2022-12-02 MED ORDER — IPRATROPIUM-ALBUTEROL 0.5-2.5 (3) MG/3ML IN SOLN
3.0000 mL | RESPIRATORY_TRACT | Status: DC
  Administered 2022-12-02 – 2022-12-04 (×14): 3 mL via RESPIRATORY_TRACT
  Filled 2022-12-02 (×15): qty 3

## 2022-12-02 MED ORDER — ACETYLCYSTEINE 20 % IN SOLN
2.0000 mL | Freq: Two times a day (BID) | RESPIRATORY_TRACT | Status: DC
  Administered 2022-12-02 – 2022-12-04 (×4): 2 mL via RESPIRATORY_TRACT
  Administered 2022-12-05: 4 mL via RESPIRATORY_TRACT
  Filled 2022-12-02 (×7): qty 4

## 2022-12-02 MED ORDER — PANTOPRAZOLE SODIUM 40 MG PO TBEC
40.0000 mg | DELAYED_RELEASE_TABLET | Freq: Every day | ORAL | Status: DC
  Administered 2022-12-02 – 2022-12-07 (×6): 40 mg via ORAL
  Filled 2022-12-02 (×6): qty 1

## 2022-12-02 MED ORDER — METHYLPREDNISOLONE SODIUM SUCC 125 MG IJ SOLR
80.0000 mg | Freq: Every day | INTRAMUSCULAR | Status: DC
  Administered 2022-12-02 – 2022-12-05 (×4): 80 mg via INTRAVENOUS
  Filled 2022-12-02 (×4): qty 2

## 2022-12-02 MED ORDER — DM-GUAIFENESIN ER 30-600 MG PO TB12: 1.0000 | ORAL_TABLET | Freq: Two times a day (BID) | ORAL | Status: DC | PRN

## 2022-12-02 MED ORDER — HYDRALAZINE HCL 20 MG/ML IJ SOLN
5.0000 mg | INTRAMUSCULAR | Status: DC | PRN
  Administered 2022-12-03: 5 mg via INTRAVENOUS
  Filled 2022-12-02 (×2): qty 1

## 2022-12-02 MED ORDER — ONDANSETRON HCL 4 MG/2ML IJ SOLN: 4.0000 mg | Freq: Three times a day (TID) | INTRAMUSCULAR | Status: DC | PRN

## 2022-12-02 NOTE — ED Notes (Signed)
Pts mentation has improve after being on the BiPap. Pt now alert to voice and interacting appropriately. Pt remains on VS monitor

## 2022-12-02 NOTE — ED Provider Notes (Signed)
MSE was initiated and I personally evaluated the patient and placed orders (if any) at  6:41 AM on December 02, 2022.  Patient presents from home via EMS for evaluation of chest pain and shortness of breath.  Reportedly persistently hypoxic, though I suspect inaccurate pulse ox readings.  We immediately placed her on BiPAP.  She is wheezing throughout, tachypneic to the 30s and with poor airflow and pulmonary auscultation.  Pulling good volumes immediately on the BiPAP and started to perk up some.  GCS of about 9 or 10 on arrival.  Follows very simple commands in all 4 extremities without apparent deficit.  Workup initiated and please see oncoming day team's documentation for more thorough evaluation and workup.  The patient appears stable so that the remainder of the MSE may be completed by another provider.   Vladimir Crofts, MD 12/02/22 [phone]

## 2022-12-02 NOTE — ED Triage Notes (Signed)
Pt BIB EMS from home d/t increased work of breathing and hypoxia. On Arrival pt difficult to arouse. Was initially 60s RA transported on NRB and nasal cannula.

## 2022-12-02 NOTE — ED Provider Notes (Signed)
[facility] Provider Note    Event Date/Time   First MD Initiated Contact with Patient 12/02/22 [phone]     (approximate)   History   Shortness of Breath   HPI  Kathy Villanueva is a 88 y.o. adult  with pmh GERD, CKD Osteoarthritis, gout who p/w shortness of breath.  Patient tells me that she was been short of breath for about a week.  Apparently when EMS arrived she was satting in the 60s, and on arrival to ED she had increased work of breathing so was placed on BiPAP initially.  Spoke with patient's daughter who is at bedside.  She does not live with the patient and was told by her brother who patient lives with that she was in the hospital but she is not sure why.  Patient's son is the POA.  Patient's daughter in law who I spoke with says that last evening she was complaining of both headache and chest pain and difficulty breathing.  Patient son was up with her all night and eventually called the ambulance.  Patient was recently hospitalized at outside hospital.  Reviewed records she was admitted for altered mental status was found to have E. coli UTI and was diagnosed with new heart failure, EF 30 to 35%.      Past Medical History:  Diagnosis Date   Arthritis    hand   GERD (gastroesophageal reflux disease)    Gout    hand   Gross hematuria    Hematoma of kidney    History of kidney stones    HTN (hypertension)    controlled on meds   Hydronephrosis    Hypercholesteremia    Kidney stones    Nausea    Transfusion history    Ureteral stone     Patient Active Problem List   Diagnosis Date Noted   Protein-calorie malnutrition, severe 10/03/2021   SBO (small bowel obstruction) ([place]) 10/01/2021   Hematoma of kidney 11/12/2015   History of nephrolithiasis 11/12/2015     Physical Exam  Triage Vital Signs: ED Triage Vitals  Enc Vitals Group     BP 12/02/22 0630 (!) 158/75     Pulse Rate 12/02/22 0704 (!) 35     Resp 12/02/22 0630 (!) 25      Temp --      Temp src --      SpO2 12/02/22 0705 100 %     Weight --      Height --      Head Circumference --      Peak Flow --      Pain Score --      Pain Loc --      Pain Edu? --      Excl. in [place]? --     Most recent vital signs: Vitals:   12/02/22 1000 12/02/22 1031  BP: 121/66   Pulse: 68   Resp: (!) 21   Temp:    SpO2:  98%     General: Awake, no distress.  CV:  Good peripheral perfusion.  Resp:  Normal effort.  Wet sounding cough and diffuse wheezing but good air movement Abd:  No distention.  Neuro:             Awake, Alert, Oriented x 2 Other:  Patient is slightly agitated cannot really tell me what is upsetting her, she moves all of her extremities symmetrically   ED Results / Procedures / Treatments  Labs (all labs  ordered are listed, but only abnormal results are displayed) Labs Reviewed  RESP PANEL BY RT-PCR (RSV, FLU A&B, COVID)  RVPGX2 - Abnormal; Notable for the following components:      Result Value   Influenza A by PCR POSITIVE (*)    All other components within normal limits  COMPREHENSIVE METABOLIC PANEL - Abnormal; Notable for the following components:   BUN 41 (*)    Creatinine, Ser 1.70 (*)    Calcium 8.2 (*)    Total Protein 5.9 (*)    Albumin 2.9 (*)    AST 49 (*)    GFR, Estimated 27 (*)    All other components within normal limits  CBC WITH DIFFERENTIAL/PLATELET - Abnormal; Notable for the following components:   RBC 3.38 (*)    Hemoglobin 9.4 (*)    HCT 32.5 (*)    MCHC 28.9 (*)    RDW 16.8 (*)    All other components within normal limits  BLOOD GAS, ARTERIAL - Abnormal; Notable for the following components:   pO2, Arterial 346 (*)    All other components within normal limits  TROPONIN I (HIGH SENSITIVITY) - Abnormal; Notable for the following components:   Troponin I (High Sensitivity) 22 (*)    All other components within normal limits  TROPONIN I (HIGH SENSITIVITY) - Abnormal; Notable for the following components:    Troponin I (High Sensitivity) 20 (*)    All other components within normal limits  CULTURE, BLOOD (ROUTINE X 2)  CULTURE, BLOOD (ROUTINE X 2)  LACTIC ACID, PLASMA  PROTIME-INR  PROCALCITONIN  LACTIC ACID, PLASMA  URINALYSIS, ROUTINE W REFLEX MICROSCOPIC  BRAIN NATRIURETIC PEPTIDE     EKG EKG reviewed interpreted myself shows sinus rhythm, LVH with repolarization abnormality, T wave inversion prominent V4 V5    RADIOLOGY I reviewed and interpreted the CXR which does not show any acute cardiopulmonary process    PROCEDURES:  Critical Care performed: Yes, see critical care procedure note(s)  .1-3 Lead EKG Interpretation  Performed by: Rada Hay, MD Authorized by: Rada Hay, MD     Interpretation: normal     ECG rate assessment: normal     Rhythm: sinus rhythm     Ectopy: none     Conduction: normal     The patient is on the cardiac monitor to evaluate for evidence of arrhythmia and/or significant heart rate changes.   MEDICATIONS ORDERED IN ED: Medications  oseltamivir (TAMIFLU) capsule 30 mg (has no administration in time range)  ipratropium-albuterol (DUONEB) 0.5-2.5 (3) MG/3ML nebulizer solution 3 mL (3 mLs Nebulization Given 12/02/22 0824)  ipratropium-albuterol (DUONEB) 0.5-2.5 (3) MG/3ML nebulizer solution 3 mL (3 mLs Nebulization Given 12/02/22 0825)  ipratropium-albuterol (DUONEB) 0.5-2.5 (3) MG/3ML nebulizer solution 3 mL (3 mLs Nebulization Given 12/02/22 0825)  sodium chloride 0.9 % bolus 500 mL (500 mLs Intravenous New Bag/Given 12/02/22 0915)     IMPRESSION / MDM / ASSESSMENT AND PLAN / ED COURSE  I reviewed the triage vital signs and the nursing notes.                              Patient's presentation is most consistent with acute presentation with potential threat to life or bodily function.  Differential diagnosis includes, but is not limited to, viral illness, PNA, copd, CHF, aspiration    88 yo fe female recent diagnosis  of CHF who presents with shortness of breath and cough.  History is somewhat unclear as I was not present when EMS arrived but apparently patient presented in respiratory distress and was initially placed on BiPAP and was hypoxic at home.  On my evaluation patient is on BiPAP and she has no increased work of breathing but is wheezing she is satting okay and ABG showing hyperoxia so I took patient off BiPAP she was placed on nasal cannula.  Does have wheezing and a wet sounding cough.  She is oriented to person and place but not year somewhat agitated when I am asking her questions and cannot really explain much in the way of her symptoms but she is denying any chest pain currently.  I spoke with patient's daughter-in-law who lives with the patient apparently she had complained of some headache and shortness of breath overnight and the patient's son had stayed up with her and then called the ambulance.  I see that patient was also recently admitted for agitation and was diagnosed with a E. coli UTI and also diagnosed with CHF with an EF of 30%.  Patient's daughter who is at bedside says she is at her baseline in terms of her mental status.  Since chest x-ray is hyperinflated but there is no focal infiltrate.  Labs show mildly elevated troponin that is flat.  EKG is nonischemic.  She does have an AKI creatinine 1.7 from baseline of around 1.2.  Will give small fluid bolus given patient's heart failure.  She has no peripheral edema does not look volume overloaded. Patient is notably flu positive which likely explains her dyspnea.  Patient is currently requiring 3 L when I attempted to place her off oxygen she does desat to the high 80s.  Will give Tamiflu and Tylenol for headache.  She will require admission given her hypoxic respiratory failure.  Did discuss with the patient her wishes regarding CODE STATUS she does not want to be placed on a ventilator or have compressions.  She is DNR.   Clinical Course as  of 12/02/22 1040  Mon Dec 02, 2022  0651 reassessed [DS]    Clinical Course User Index [DS] Vladimir Crofts, MD     FINAL CLINICAL IMPRESSION(S) / ED DIAGNOSES   Final diagnoses:  Influenza A  Acute respiratory failure with hypoxia ([place])     Rx / DC Orders   ED Discharge Orders     None        Note:  This document was prepared using Dragon voice recognition software and may include unintentional dictation errors.   Rada Hay, MD 12/02/22 1040

## 2022-12-02 NOTE — H&P (Addendum)
History and Physical    Kathy Villanueva UDJ:[id number] DOB: Oct 29, 1924 DOA: 12/02/2022  Referring MD/NP/PA:   PCP: Baxter Hire, MD   Patient coming from:  The patient is coming from home.    Chief Complaint: SOB  HPI: Kathy Villanueva is a 88 y.o. adult with medical history significant of hypertension, hyperlipidemia, systolic CHF with EF 58%, gout, GERD, anemia, ureteral stone, hydronephrosis, SBO, who presents with shortness breath.  Patient has AMS, and is unable to provide accurate medical history. I called her daughter and granddaughter by phone, who provided most of the history.  Per her daughter, pt was found to have cough and shortness of breath since morning, which has been progressively worsening.  Patient also complains of chest pain, but her daughter cannot provide detailed information.  Patient does not have active nausea, vomiting, diarrhea. Pt has central abdominal tenderness on my examination. At her normal baseline, patient recognizes family, sometimes knows the place, sometimes knows the time.  When I saw patient in ED, patient is confused, she seems to know her own name, but she is not orientated to the place and time.  She moves all extremities.  No facial droop noted.  Not sure if patient has symptoms of UTI.  No leg edema.  Patient is normally not using oxygen at home.  Patient was found to have oxygen desaturation to 60% initially, then 80% on room air, patient was placed on nonrebreather, still has respiratory distress, BiPAP was started in the ED.  When I saw patient in ED, BiPAP is weaned off, currently on 3 L oxygen with 98% of oxygen saturation.  Data reviewed independently and ED Course: pt was found to have positive flu PCR, negative COVID, WBC 8.0, lactic acid 1.7, procalcitonin 0.52, INR 1.1, BNP 225.8, troponin level 22, 20, worsening renal function with creatinine 1.70, BUN 41, GFR 27 (recent baseline creatinine 1.26 09/07/2022), temperature normal, blood pressure  121/66, heart rate 35, 68, RR 25, oxygen saturation.  Chest x-ray negative for infiltration.  ABG with pH 7.41, CO2 37, O2 346.  Patient is admitted to PCU as inpatient.  CXR: Hyperexpansion without acute cardiopulmonary findings.   Prominent thoracic aorta measuring up to 4.5 cm diameter in the transverse segment, consistent with aneurysm  CT-abd/pelvis: 1. No acute findings in the abdomen or pelvis. 2. Bilateral bronchiectasis with scattered areas of mucous plugging and atelectasis, likely due to chronic aspiration. 3. Aortic Atherosclerosis (ICD10-I70.0).   EKG: I have personally reviewed.  Sinus rhythm, QTc 439, T wave inversion in V4-V6, LVH.   Review of Systems: Could not be reviewed due to altered mental status.  Allergy: No Known Allergies  Past Medical History:  Diagnosis Date   Arthritis    hand   Chronic systolic CHF (congestive heart failure) (HCC)    GERD (gastroesophageal reflux disease)    Gout    hand   Gross hematuria    Hematoma of kidney    History of kidney stones    HTN (hypertension)    controlled on meds   Hydronephrosis    Hypercholesteremia    Kidney stones    Nausea    Transfusion history    Ureteral stone     Past Surgical History:  Procedure Laterality Date   ABDOMINAL HYSTERECTOMY     CATARACT EXTRACTION W/PHACO Left 10/20/2019   Procedure: CATARACT EXTRACTION PHACO AND INTRAOCULAR LENS PLACEMENT ([place]) LEFT MALYUGIN 01:25.9,  13.6%, 11.71;  Surgeon: Leandrew Koyanagi, MD;  Location: [facility];  Service:  Ophthalmology;  Laterality: Left;   COLONOSCOPY     KIDNEY STONE SURGERY     LAPAROTOMY N/A 10/04/2021   Procedure: EXPLORATORY LAPAROTOMY;  Surgeon: Herbert Pun, MD;  Location: [facility];  Service: General;  Laterality: N/A;   LYSIS OF ADHESION N/A 10/04/2021   Procedure: LYSIS OF ADHESION;  Surgeon: Herbert Pun, MD;  Location: [facility];  Service: General;  Laterality: N/A;    Social History:  reports  that he has never smoked. He has never used smokeless tobacco. He reports that he does not drink alcohol and does not use drugs.  Family History:  Family History  Problem Relation Age of Onset   Kidney disease Neg Hx    Bladder Cancer Neg Hx      Prior to Admission medications   Medication Sig Start Date End Date Taking? Authorizing Provider  Cyanocobalamin 1000 MCG TBCR Take by mouth.    [provider]  molnupiravir EUA (LAGEVRIO) 200 MG CAPS capsule Take 4 capsules by mouth 2 (two) times daily. Patient not taking: No sig reported 04/19/21   [provider]  pantoprazole (PROTONIX) 40 MG tablet Take 1 tablet (40 mg total) by mouth daily. 10/14/21 10/14/22  Shelly Coss, MD  polyethylene glycol (MIRALAX) 17 g packet Take 17 g by mouth daily. 10/14/21   Shelly Coss, MD    Physical Exam: Vitals:   12/02/22 1100 12/02/22 1115 12/02/22 1132 12/02/22 1600  BP: 116/86   (!) 170/85  Pulse:  66  71  Resp: (!) 23   (!) 28  Temp:      TempSrc:      SpO2:      Weight:   43.3 kg    General: Not in acute distress HEENT:       Eyes: PERRL, EOMI, no scleral icterus.       ENT: No discharge from the ears and nose.       Neck: No JVD, no bruit, no mass felt. Heme: No neck lymph node enlargement. Cardiac: S1/S2, RRR, No murmurs, No gallops or rubs. Respiratory: No rales, wheezing, rhonchi or rubs. GI: Soft, nondistended, has central abdominal tenderness, no organomegaly, BS present. GU: No hematuria Ext: No pitting leg edema bilaterally. 1+DP/PT pulse bilaterally. Musculoskeletal: No joint deformities, No joint redness or warmth, no limitation of ROM in spin. Skin: No rashes.  Neuro: Confused, not following command, seems to know her own name, but not oriented to place and time. Cranial nerves II-XII grossly intact, moves all extremities  Psych: Patient is not psychotic, no suicidal or hemocidal ideation.  Labs on Admission: I have personally reviewed following labs  and imaging studies  CBC: Recent Labs  Lab 12/02/22 0631  WBC 8.0  NEUTROABS 3.4  HGB 9.4*  HCT 32.5*  MCV 96.2  PLT 742   Basic Metabolic Panel: Recent Labs  Lab 12/02/22 0631  NA 141  K 4.6  CL 110  CO2 22  GLUCOSE 86  BUN 41*  CREATININE 1.70*  CALCIUM 8.2*   GFR: Estimated Creatinine Clearance (by C-G formula based on SCr of 1.7 mg/dL (H)) Female: 13.2 mL/min (A) Female: 15.6 mL/min (A) Liver Function Tests: Recent Labs  Lab 12/02/22 0631  AST 49*  ALT 20  ALKPHOS 75  BILITOT 0.8  PROT 5.9*  ALBUMIN 2.9*   No results for input(s): "LIPASE", "AMYLASE" in the last 168 hours. No results for input(s): "AMMONIA" in the last 168 hours. Coagulation Profile: Recent Labs  Lab 12/02/22 0631  INR 1.1  Cardiac Enzymes: No results for input(s): "CKTOTAL", "CKMB", "CKMBINDEX", "TROPONINI" in the last 168 hours. BNP (last 3 results) No results for input(s): "PROBNP" in the last 8760 hours. HbA1C: No results for input(s): "HGBA1C" in the last 72 hours. CBG: No results for input(s): "GLUCAP" in the last 168 hours. Lipid Profile: No results for input(s): "CHOL", "HDL", "LDLCALC", "TRIG", "CHOLHDL", "LDLDIRECT" in the last 72 hours. Thyroid Function Tests: No results for input(s): "TSH", "T4TOTAL", "FREET4", "T3FREE", "THYROIDAB" in the last 72 hours. Anemia Panel: No results for input(s): "VITAMINB12", "FOLATE", "FERRITIN", "TIBC", "IRON", "RETICCTPCT" in the last 72 hours. Urine analysis:    Component Value Date/Time   COLORURINE YELLOW (A) 09/07/2022 1805   APPEARANCEUR HAZY (A) 09/07/2022 1805   APPEARANCEUR Clear 11/10/2015 1022   LABSPEC 1.017 09/07/2022 1805   PHURINE 5.0 09/07/2022 1805   GLUCOSEU NEGATIVE 09/07/2022 1805   HGBUR SMALL (A) 09/07/2022 1805   BILIRUBINUR NEGATIVE 09/07/2022 1805   BILIRUBINUR Negative 11/10/2015 [place] 09/07/2022 1805   PROTEINUR 100 (A) 09/07/2022 1805   NITRITE NEGATIVE 09/07/2022 1805    LEUKOCYTESUR NEGATIVE 09/07/2022 1805   Sepsis Labs: @LABRCNTIP (procalcitonin:4,lacticidven:4) ) Recent Results (from the past 240 hour(s))  Resp panel by RT-PCR (RSV, Flu A&B, Covid) Anterior Nasal Swab     Status: Abnormal   Collection Time: 12/02/22  6:30 AM   Specimen: Anterior Nasal Swab  Result Value Ref Range Status   SARS Coronavirus 2 by RT PCR NEGATIVE NEGATIVE Final    Comment: (NOTE) SARS-CoV-2 target nucleic acids are NOT DETECTED.  The SARS-CoV-2 RNA is generally detectable in upper respiratory specimens during the acute phase of infection. The lowest concentration of SARS-CoV-2 viral copies this assay can detect is 138 copies/mL. A negative result does not preclude SARS-Cov-2 infection and should not be used as the sole basis for treatment or other patient management decisions. A negative result may occur with  improper specimen collection/handling, submission of specimen other than nasopharyngeal swab, presence of viral mutation(s) within the areas targeted by this assay, and inadequate number of viral copies(<138 copies/mL). A negative result must be combined with clinical observations, patient history, and epidemiological information. The expected result is Negative.  Fact Sheet for Patients:  EntrepreneurPulse.com.au  Fact Sheet for Healthcare Providers:  IncredibleEmployment.be  This test is no t yet approved or cleared by the [place] FDA and  has been authorized for detection and/or diagnosis of SARS-CoV-2 by FDA under an Emergency Use Authorization (EUA). This EUA will remain  in effect (meaning this test can be used) for the duration of the COVID-19 declaration under Section 564(b)(1) of the Act, 21 U.S.C.section 360bbb-3(b)(1), unless the authorization is terminated  or revoked sooner.       Influenza A by PCR POSITIVE (A) NEGATIVE Final   Influenza B by PCR NEGATIVE NEGATIVE Final    Comment: (NOTE) The  Xpert Xpress SARS-CoV-2/FLU/RSV plus assay is intended as an aid in the diagnosis of influenza from Nasopharyngeal swab specimens and should not be used as a sole basis for treatment. Nasal washings and aspirates are unacceptable for Xpert Xpress SARS-CoV-2/FLU/RSV testing.  Fact Sheet for Patients: EntrepreneurPulse.com.au  Fact Sheet for Healthcare Providers: IncredibleEmployment.be  This test is not yet approved or cleared by the [place] FDA and has been authorized for detection and/or diagnosis of SARS-CoV-2 by FDA under an Emergency Use Authorization (EUA). This EUA will remain in effect (meaning this test can be used) for the duration of the COVID-19 declaration under Section  564(b)(1) of the Act, 21 U.S.C. section 360bbb-3(b)(1), unless the authorization is terminated or revoked.     Resp Syncytial Virus by PCR NEGATIVE NEGATIVE Final    Comment: (NOTE) Fact Sheet for Patients: EntrepreneurPulse.com.au  Fact Sheet for Healthcare Providers: IncredibleEmployment.be  This test is not yet approved or cleared by the [place] FDA and has been authorized for detection and/or diagnosis of SARS-CoV-2 by FDA under an Emergency Use Authorization (EUA). This EUA will remain in effect (meaning this test can be used) for the duration of the COVID-19 declaration under Section 564(b)(1) of the Act, 21 U.S.C. section 360bbb-3(b)(1), unless the authorization is terminated or revoked.  Performed at [facility], [address] [place], [place] [address]      Radiological Exams on Admission: US Venous Img Lower Bilateral (DVT)  Result Date: 12/02/2022 CLINICAL DATA:  Positive D-dimer EXAM: BILATERAL LOWER EXTREMITY VENOUS DOPPLER ULTRASOUND TECHNIQUE: Gray-scale sonography with compression, as well as color and duplex ultrasound, were performed to evaluate the deep venous system(s) from the  level of the common femoral vein through the popliteal and proximal calf veins. COMPARISON:  None Available. FINDINGS: VENOUS Normal compressibility of the common femoral, superficial femoral, and popliteal veins, as well as the visualized calf veins. Visualized portions of profunda femoral vein and great saphenous vein unremarkable. No filling defects to suggest DVT on grayscale or color Doppler imaging. Doppler waveforms show normal direction of venous flow, normal respiratory plasticity and response to augmentation. OTHER None. Limitations: Evaluation is limited due to patient cooperation and body habitus. There is suboptimal visualization of the left calf veins. IMPRESSION: 1. No evidence of deep venous thrombosis within either lower extremity. Electronically Signed   By: Randa Ngo M.D.   On: 12/02/2022 16:47   CT ABDOMEN PELVIS WO CONTRAST  Result Date: 12/02/2022 CLINICAL DATA:  Abdominal pain EXAM: CT ABDOMEN AND PELVIS WITHOUT CONTRAST TECHNIQUE: Multidetector CT imaging of the abdomen and pelvis was performed following the standard protocol without IV contrast. RADIATION DOSE REDUCTION: This exam was performed according to the departmental dose-optimization program which includes automated exposure control, adjustment of the mA and/or kV according to patient size and/or use of iterative reconstruction technique. COMPARISON:  CT abdomen and pelvis dated October 01, 2021 FINDINGS: Lower chest: Bilateral bronchiectasis with scattered areas of mucous plugging and atelectasis. Hepatobiliary: No focal liver abnormality is seen. Status post cholecystectomy. No biliary dilatation. Pancreas: Unremarkable. No pancreatic ductal dilatation or surrounding inflammatory changes. Spleen: Normal in size without focal abnormality. Adrenals/Urinary Tract: Bilateral adrenal glands are unremarkable. Bilateral cortical atrophy. Malrotated kidney with scarring. No hydronephrosis. Bladder is unremarkable. Stomach/Bowel:  Stomach is within normal limits. Appendix appears normal. No evidence of bowel wall thickening, distention, or inflammatory changes. Vascular/Lymphatic: Aortic atherosclerosis. No enlarged abdominal or pelvic lymph nodes. Reproductive: No adnexal masses. Other: No abdominal wall hernia or abnormality. No abdominopelvic ascites. Musculoskeletal: Dextrocurvature of the lumbar spine. No acute or significant osseous findings. IMPRESSION: 1. No acute findings in the abdomen or pelvis. 2. Bilateral bronchiectasis with scattered areas of mucous plugging and atelectasis, likely due to chronic aspiration. 3. Aortic Atherosclerosis (ICD10-I70.0). Electronically Signed   By: Yetta Glassman M.D.   On: 12/02/2022 13:32   CT HEAD WO CONTRAST (5MM)  Result Date: 12/02/2022 CLINICAL DATA:  Mental status change of unknown cause. EXAM: CT HEAD WITHOUT CONTRAST TECHNIQUE: Contiguous axial images were obtained from the base of the skull through the vertex without intravenous contrast. RADIATION DOSE REDUCTION: This exam was performed according  to the departmental dose-optimization program which includes automated exposure control, adjustment of the mA and/or kV according to patient size and/or use of iterative reconstruction technique. COMPARISON:  05/26/2018. FINDINGS: Brain: No evidence of acute infarction, hemorrhage, hydrocephalus, extra-axial collection or mass lesion/mass effect. Sulcal enlargement and widened subarachnoid spaces have increased since the prior exam consistent with atrophy. Vascular: No hyperdense vessel or unexpected calcification. Skull: Normal. Negative for fracture or focal lesion. Sinuses/Orbits: Visualized globes and orbits are unremarkable. Mild to moderate right and mild left ethmoid sinus mucosal thickening. Mild right and minor left maxillary sinus mucosal thickening. Mild mucosal thickening with dependent fluid in the sphenoid sinuses. Other: None. IMPRESSION: 1. No acute intracranial  abnormalities. 2. Atrophy/volume loss increased when compared to the CT from 05/26/2018. 3. Sinus disease as detailed. Electronically Signed   By: Lajean Manes M.D.   On: 12/02/2022 12:05   DG Chest Portable 1 View  Result Date: 12/02/2022 CLINICAL DATA:  Respiratory distress. EXAM: PORTABLE CHEST 1 VIEW COMPARISON:  09/27/2021 FINDINGS: Lungs are hyperexpanded. Prominent thoracic aorta is stable. The lungs are clear without focal pneumonia, edema, pneumothorax or pleural effusion. The visualized bony structures of the thorax are unremarkable. Telemetry leads overlie the chest. IMPRESSION: Hyperexpansion without acute cardiopulmonary findings. Prominent thoracic aorta measuring up to 4.5 cm diameter in the transverse segment, consistent with aneurysm. Transverse aorta measured 3.7 cm diameter on CT chest 03/15/2008. Follow-up CT chest could be used to further characterize as clinically warranted. Electronically Signed   By: Misty Stanley M.D.   On: 12/02/2022 07:41      Assessment/Plan Principal Problem:   Influenza A Active Problems:   Acute respiratory failure with hypoxia (HCC)   Acute metabolic encephalopathy   Chronic systolic CHF (congestive heart failure) (HCC)   Myocardial injury   Acute renal failure superimposed on stage 3b chronic kidney disease (HCC)   Normocytic anemia   Chest pain   Abdominal pain   Thoracic aortic aneurysm (HCC)   Protein-calorie malnutrition, severe   HTN (hypertension)   Assessment and Plan:  Acute respiratory failure with hypoxia due to influenza A: Patient does not meets criteria for sepsis (WBC 8.0, temperature 98.4, heart rate 68, RR 25).  -Admitted to PCU as inpatient -BiPAP -Started Tamiflu -Blood culture -IV fluid: 500 cc normal saline -Bronchodilators -Solu-Medrol 40 mg twice daily -Mucomyst nebulizer due to mucous plugging -Nasal cannula oxygen to maintain oxygen saturation above 93% when pt is off BiPAP  Acute metabolic  encephalopathy: CT head negative.  Possibly due to ongoing infection -Frequent neurochecks  Chronic systolic CHF (congestive heart failure) ([place]): 2D echo on 11/27/2022 showed EF of 40%.  Patient does not have leg edema JVD.  CHF seem to be compensated. -Watch volume status closely  Myocardial injury and chest pain: Troponin level 22 --> 20.  D-dimer +5.03.  Need to rule out PE -Check A1c, FLP -Follow-up with him to venous Doppler to rule out a DVT --> negative -Follow-up VQ scan to rule out PE  Acute renal failure superimposed on stage 3b chronic kidney disease ([place]): Possibly due to dehydration -Avoid using renal toxic medications -IV fluid: 500 cc normal saline  Normocytic anemia: Hemoglobin 9.4 (11.0 on 09/07/2022), slightly dropped.  No active bleeding -Follow up with a CBC  Abdominal pain: CT-abd/pelvis negative for acute issue -Supportive care  Thoracic aortic aneurysm [facility]): Incidental findings by chest x-ray -Follow-up with PCP  Protein-calorie malnutrition, severe: Body weight 43.3 kg, BMI 14.52 -Ensure -Consulted nutrition  HTN: -coreg -IV  hydralazine as needed       DVT ppx: SQ Lovenox  Code Status: Full code per her granddaughter  Family Communication:  Yes, patient's daughter and granddaughter by phone  Disposition Plan:  Anticipate discharge back to previous environment  Consults called:  none  Admission status and Level of care: Progressive:  as inpt      Dispo: The patient is from: Home              Anticipated d/c is to: Home              Anticipated d/c date is: 2 days              Patient currently is not medically stable to d/c.    Severity of Illness:  The appropriate patient status for this patient is INPATIENT. Inpatient status is judged to be reasonable and necessary in order to provide the required intensity of service to ensure the patient's safety. The patient's presenting symptoms, physical exam findings, and initial radiographic  and laboratory data in the context of their chronic comorbidities is felt to place them at high risk for further clinical deterioration. Furthermore, it is not anticipated that the patient will be medically stable for discharge from the hospital within 2 midnights of admission.   * I certify that at the point of admission it is my clinical judgment that the patient will require inpatient hospital care spanning beyond 2 midnights from the point of admission due to high intensity of service, high risk for further deterioration and high frequency of surveillance required.*       Date of Service 12/02/2022    Ivor Costa [facility]   If 7PM-7AM, please contact night-coverage [email or web address] 12/02/2022, 6:28 PM

## 2022-12-03 ENCOUNTER — Inpatient Hospital Stay: Payer: Medicare HMO

## 2022-12-03 DIAGNOSIS — G9341 Metabolic encephalopathy: Secondary | ICD-10-CM | POA: Diagnosis not present

## 2022-12-03 DIAGNOSIS — J9601 Acute respiratory failure with hypoxia: Secondary | ICD-10-CM | POA: Diagnosis not present

## 2022-12-03 DIAGNOSIS — I2699 Other pulmonary embolism without acute cor pulmonale: Secondary | ICD-10-CM | POA: Diagnosis present

## 2022-12-03 DIAGNOSIS — J101 Influenza due to other identified influenza virus with other respiratory manifestations: Secondary | ICD-10-CM | POA: Diagnosis not present

## 2022-12-03 LAB — BASIC METABOLIC PANEL
Anion gap: 10 (ref 5–15)
BUN: 33 mg/dL — ABNORMAL HIGH (ref 8–23)
CO2: 22 mmol/L (ref 22–32)
Calcium: 8.3 mg/dL — ABNORMAL LOW (ref 8.9–10.3)
Chloride: 110 mmol/L (ref 98–111)
Creatinine, Ser: 1.38 mg/dL — ABNORMAL HIGH (ref 0.44–1.00)
GFR, Estimated: 35 mL/min — ABNORMAL LOW (ref >60)
Glucose, Bld: 136 mg/dL — ABNORMAL HIGH (ref 70–99)
Potassium: 4.8 mmol/L (ref 3.5–5.1)
Sodium: 142 mmol/L (ref 135–145)

## 2022-12-03 LAB — CBC
HCT: 32.4 % — ABNORMAL LOW (ref 36.0–46.0)
Hemoglobin: 9.7 g/dL — ABNORMAL LOW (ref 12.0–15.0)
MCH: 28.2 pg (ref 26.0–34.0)
MCHC: 29.9 g/dL — ABNORMAL LOW (ref 30.0–36.0)
MCV: 94.2 fL (ref 80.0–100.0)
Platelets: 276 10*3/uL (ref 150–400)
RBC: 3.44 MIL/uL — ABNORMAL LOW (ref 3.87–5.11)
RDW: 16.4 % — ABNORMAL HIGH (ref 11.5–15.5)
WBC: 5.8 10*3/uL (ref 4.0–10.5)
nRBC: 0 % (ref 0.0–0.2)

## 2022-12-03 LAB — LIPID PANEL
Cholesterol: 138 mg/dL (ref 0–200)
HDL: 53 mg/dL (ref >40)
LDL Cholesterol: 71 mg/dL (ref 0–99)
Total CHOL/HDL Ratio: 2.6 RATIO
Triglycerides: 68 mg/dL (ref <150)
VLDL: 14 mg/dL (ref 0–40)

## 2022-12-03 LAB — URINALYSIS, ROUTINE W REFLEX MICROSCOPIC
Bilirubin Urine: NEGATIVE
Glucose, UA: NEGATIVE mg/dL
Hgb urine dipstick: NEGATIVE
Ketones, ur: NEGATIVE mg/dL
Nitrite: POSITIVE — AB
Protein, ur: >=300 mg/dL — AB
Specific Gravity, Urine: 1.028 (ref 1.005–1.030)
Squamous Epithelial / HPF: NONE SEEN /HPF (ref 0–5)
WBC, UA: NONE SEEN WBC/hpf (ref 0–5)
pH: 7 (ref 5.0–8.0)

## 2022-12-03 LAB — PROTIME-INR
INR: 1 (ref 0.8–1.2)
Prothrombin Time: 12.8 seconds (ref 11.4–15.2)

## 2022-12-03 LAB — APTT: aPTT: 31 seconds (ref 24–36)

## 2022-12-03 MED ORDER — SODIUM CHLORIDE 0.9 % IV SOLN: INTRAVENOUS | Status: DC

## 2022-12-03 MED ORDER — SODIUM CHLORIDE 0.9 % IV SOLN
1.0000 g | INTRAVENOUS | Status: DC
  Administered 2022-12-03 – 2022-12-06 (×4): 1 g via INTRAVENOUS
  Filled 2022-12-03: qty 10
  Filled 2022-12-03 (×2): qty 1
  Filled 2022-12-03 (×2): qty 10

## 2022-12-03 MED ORDER — HEPARIN (PORCINE) 25000 UT/250ML-% IV SOLN
650.0000 [IU]/h | INTRAVENOUS | Status: DC
  Administered 2022-12-03: 700 [IU]/h via INTRAVENOUS
  Filled 2022-12-03: qty 250

## 2022-12-03 MED ORDER — TECHNETIUM TO 99M ALBUMIN AGGREGATED
4.1000 | Freq: Once | INTRAVENOUS | Status: AC | PRN
  Administered 2022-12-03: 4.1 via INTRAVENOUS

## 2022-12-03 MED ORDER — AZITHROMYCIN 500 MG PO TABS
500.0000 mg | ORAL_TABLET | Freq: Every day | ORAL | Status: DC
  Administered 2022-12-03 – 2022-12-06 (×4): 500 mg via ORAL
  Filled 2022-12-03 (×4): qty 1

## 2022-12-03 MED ORDER — HEPARIN BOLUS VIA INFUSION
2500.0000 [IU] | Freq: Once | INTRAVENOUS | Status: AC
  Administered 2022-12-03: 2500 [IU] via INTRAVENOUS
  Filled 2022-12-03: qty 2500

## 2022-12-03 MED ORDER — TECHNETIUM TO 99M ALBUMIN AGGREGATED
4.0400 | Freq: Once | INTRAVENOUS | Status: AC | PRN
  Administered 2022-12-03: 4.04 via INTRAVENOUS

## 2022-12-03 NOTE — Consult Note (Signed)
ANTICOAGULATION CONSULT NOTE - Initial Consult  Pharmacy Consult for heparin drip Indication: pulmonary embolus  No Known Allergies  Patient Measurements: Weight: 43.3 kg (95 lb 8 oz) Heparin Dosing Weight: 43.3 kg  Vital Signs: Temp: 98.3 F (36.8 C) (12/26 1300) Temp Source: Oral (12/26 1300) BP: 159/86 (12/26 1500) Pulse Rate: 77 (12/26 1500)  Labs: Recent Labs    12/02/22 0631 12/02/22 0834 12/02/22 1201 12/03/22 0448  HGB 9.4*  --   --  9.7*  HCT 32.5*  --   --  32.4*  PLT 260  --   --  276  LABPROT 13.9  --   --   --   INR 1.1  --   --   --   CREATININE 1.70*  --   --  1.38*  TROPONINIHS 22* 20* 24*  --     Estimated Creatinine Clearance (by C-G formula based on SCr of 1.38 mg/dL (H)) Female: 16.3 mL/min (A) Female: 19.2 mL/min (A)   Medical History: Past Medical History:  Diagnosis Date   Arthritis    hand   Chronic systolic CHF (congestive heart failure) (HCC)    GERD (gastroesophageal reflux disease)    Gout    hand   Gross hematuria    Hematoma of kidney    History of kidney stones    HTN (hypertension)    controlled on meds   Hydronephrosis    Hypercholesteremia    Kidney stones    Nausea    Transfusion history    Ureteral stone     Medications:  No home anticoagulation per pharmacist review  Assessment: 88 yo female presented to the ED with AMS, cough and SOB.  Patient was found to be Influenza A positive and have elevated D-dimer.  Patient's VQ scan showed high probability of PE.   Baseline labs: hgb = 9.7, plt = 276, aPTT and INR pending  Goal of Therapy:  Heparin level 0.3-0.7 units/ml Monitor platelets by anticoagulation protocol: Yes   Plan:  Give 2500 units bolus x 1 Start heparin infusion at 700 units/hr Check anti-Xa level in 8 hours and daily while on heparin Continue to monitor H&H and platelets  Lorin Picket, PharmD 12/03/2022,5:42 PM

## 2022-12-03 NOTE — Assessment & Plan Note (Signed)
Patient previously had a history of systolic heart failure with ejection fraction of 40%.  When she started having episodes of hypertensive urgency, BNP checked and found to be elevated at 1600.  Echocardiogram checked 12/28 noted preserved ejection fraction and indeterminate diastolic function.  Started on IV Lasix.

## 2022-12-03 NOTE — Assessment & Plan Note (Addendum)
Secondary to flu and less likely blood clot given patient now on room air.  Given elevated procalcitonin, will add antibiotic coverage

## 2022-12-03 NOTE — Assessment & Plan Note (Signed)
Initially was receiving gentle IV fluids which were discontinued when BNP elevated.  Creatinine stable for now.

## 2022-12-03 NOTE — Assessment & Plan Note (Signed)
High probability given elevated D-dimer and VQ scan.  Discussed with family and we are meeting tomorrow to evaluate if patient needs to be placed on anticoagulation.  Lower extremity Dopplers have already ruled out DVT, so long-term anticoagulation would be done to prevent future thrombus and would need to be lifelong.  Family opted for starting Eliquis which was done 12/28

## 2022-12-03 NOTE — Assessment & Plan Note (Signed)
Stable at this time 

## 2022-12-03 NOTE — Assessment & Plan Note (Signed)
Stable

## 2022-12-03 NOTE — Assessment & Plan Note (Signed)
On Tamiflu 

## 2022-12-03 NOTE — Hospital Course (Signed)
Patient is a 88 year old female with past medical history of hypertension, severe protein calorie malnutrition, stage IIIb chronic kidney disease and systolic CHF with 34% ejection fraction who presented to the emergency room on 12/25 with confusion and shortness of breath.  In the emergency room, patient found to be hypoxic initially requiring BiPAP, but then able to be weaned down to 3 L nasal cannula.  Workup revealed positive for flu and mild heart failure.  Patient started on Tamiflu and given mild Lasix and admitted to the hospitalist service.  Workup also revealed elevated D-dimer.  VQ scan done on 12/26 with high probability for right lower lobe pulmonary embolus.  Patient able to be weaned off onto room air.

## 2022-12-03 NOTE — Progress Notes (Signed)
[facility] Progress Note  Patient: Kathy Villanueva    BVA:[id number]  DOA: 12/02/2022    Date of Service: the patient was seen and examined on 12/03/2022  Brief hospital course: Patient is a 88 year old female with past medical history of hypertension, severe protein calorie malnutrition, stage IIIb chronic kidney disease and systolic CHF with 31% ejection fraction who presented to the emergency room on 12/25 with confusion and shortness of breath.  In the emergency room, patient found to be hypoxic initially requiring BiPAP, but then able to be weaned down to 3 L nasal cannula.  Workup revealed positive for flu and mild heart failure.  Patient started on Tamiflu and given mild Lasix and admitted to the hospitalist service.  Workup also revealed elevated D-dimer.  VQ scan done on 12/26 with high probability for right lower lobe pulmonary embolus.  Patient able to be weaned off onto room air.   Assessment and Plan: * Influenza A On Tamiflu  Acute respiratory failure with hypoxia ([place]) Secondary to flu and less likely blood clot given patient now on room air  Pulmonary embolus, right ([place]) High probability given elevated D-dimer and VQ scan.  Discussed with family and we are meeting tomorrow to evaluate if patient needs to be placed on anticoagulation.  Given overall stability, advanced age, renal failure, protein calorie malnutrition, I am concerned that we may cause more harm than good.  Lower extremity Dopplers have already ruled out DVT, so would be fairly acceptable option not to place patient on anticoagulation.  Acute metabolic encephalopathy Much improved with treatment of hypoxia  Chronic systolic CHF (congestive heart failure) ([place]) Patient is slightly dehydrated.  Acute renal failure superimposed on stage 3b chronic kidney disease ([place]) Getting very gentle IV fluids, improving  Thoracic aortic aneurysm (HCC) Stable at this time.  Normocytic  anemia Stable  Protein-calorie malnutrition, severe BMI 14.5.  Nutrition consulted.       Body mass index is 14.52 kg/m.        Consultants: None  Procedures: VQ scan noting high probability for right lower lobe pulmonary embolus  Antimicrobials: IV Rocephin and Zithromax 12/26-present  Code Status: Full code   Subjective: Patient resting comfortably, no complaints  Objective: Vital signs were reviewed and unremarkable. Vitals:   12/03/22 1400 12/03/22 1500  BP: (!) 158/92 (!) 159/86  Pulse: 73 77  Resp: 15 19  Temp:    SpO2:  95%   No intake or output data in the 24 hours ending 12/03/22 1734 Filed Weights   12/02/22 1132  Weight: 43.3 kg   Body mass index is 14.52 kg/m.  Exam:  General: Resting comfortably, no acute distress HEENT: Normocephalic, atraumatic, mucous membranes slightly dry Cardiovascular: Regular rate and rhythm, S1-S2, 2 out of 6 systolic ejection murmur Respiratory: Decreased breath sounds throughout Abdomen: Soft, nontender, nondistended, positive bowel sounds Musculoskeletal: No clubbing or cyanosis or edema Skin: no skin breaks, tears or lesions Psychiatry: Appropriate, no evidence of psychoses Neurology: No focal deficits  Data Reviewed: No labs today  Disposition:  Status is: Inpatient Remains inpatient appropriate because:  -Improvement in renal function -Decision on anticoagulation    Anticipated discharge date: 12/27  Family Communication: Updated daughter by phone DVT Prophylaxis: enoxaparin (LOVENOX) injection 30 mg Start: 12/02/22 2200    Author: Annita Brod ,MD 12/03/2022 5:34 PM  To reach On-call, see care teams to locate the attending and reach out via [email or web address]. Between 7PM-7AM, please contact night-coverage If you still have difficulty reaching the attending  provider, please page the [facility] (Director on Call) for [facility] on amion for assistance.

## 2022-12-03 NOTE — Assessment & Plan Note (Signed)
BMI 14.5.  Nutrition consulted.

## 2022-12-03 NOTE — ED Notes (Signed)
Called lab from blood draw \

## 2022-12-03 NOTE — Assessment & Plan Note (Signed)
Much improved with treatment of hypoxia.  Intermittent episodes of confusion, she likely may have some mild underlying dementia/hospital delirium

## 2022-12-03 NOTE — ED Notes (Signed)
Request made for transport to the floor ?

## 2022-12-04 DIAGNOSIS — J101 Influenza due to other identified influenza virus with other respiratory manifestations: Secondary | ICD-10-CM | POA: Diagnosis not present

## 2022-12-04 DIAGNOSIS — I5022 Chronic systolic (congestive) heart failure: Secondary | ICD-10-CM | POA: Diagnosis not present

## 2022-12-04 DIAGNOSIS — I2699 Other pulmonary embolism without acute cor pulmonale: Secondary | ICD-10-CM | POA: Diagnosis not present

## 2022-12-04 DIAGNOSIS — E43 Unspecified severe protein-calorie malnutrition: Secondary | ICD-10-CM | POA: Diagnosis not present

## 2022-12-04 LAB — HEMOGLOBIN A1C
Hgb A1c MFr Bld: 5.3 % (ref 4.8–5.6)
Mean Plasma Glucose: 105 mg/dL

## 2022-12-04 LAB — CBC
HCT: 31.1 % — ABNORMAL LOW (ref 36.0–46.0)
Hemoglobin: 9.7 g/dL — ABNORMAL LOW (ref 12.0–15.0)
MCH: 28 pg (ref 26.0–34.0)
MCHC: 31.2 g/dL (ref 30.0–36.0)
MCV: 89.6 fL (ref 80.0–100.0)
Platelets: 302 10*3/uL (ref 150–400)
RBC: 3.47 MIL/uL — ABNORMAL LOW (ref 3.87–5.11)
RDW: 16.5 % — ABNORMAL HIGH (ref 11.5–15.5)
WBC: 7 10*3/uL (ref 4.0–10.5)
nRBC: 0 % (ref 0.0–0.2)

## 2022-12-04 LAB — BASIC METABOLIC PANEL
Anion gap: 4 — ABNORMAL LOW (ref 5–15)
BUN: 30 mg/dL — ABNORMAL HIGH (ref 8–23)
CO2: 26 mmol/L (ref 22–32)
Calcium: 8 mg/dL — ABNORMAL LOW (ref 8.9–10.3)
Chloride: 104 mmol/L (ref 98–111)
Creatinine, Ser: 1.32 mg/dL — ABNORMAL HIGH (ref 0.44–1.00)
GFR, Estimated: 37 mL/min — ABNORMAL LOW (ref >60)
Glucose, Bld: 118 mg/dL — ABNORMAL HIGH (ref 70–99)
Potassium: 4.7 mmol/L (ref 3.5–5.1)
Sodium: 134 mmol/L — ABNORMAL LOW (ref 135–145)

## 2022-12-04 LAB — HEPARIN LEVEL (UNFRACTIONATED)
Heparin Unfractionated: 0.8 IU/mL — ABNORMAL HIGH (ref 0.30–0.70)
Heparin Unfractionated: 0.7 IU/mL (ref 0.30–0.70)

## 2022-12-04 LAB — PROCALCITONIN: Procalcitonin: 0.21 ng/mL

## 2022-12-04 LAB — BRAIN NATRIURETIC PEPTIDE: B Natriuretic Peptide: 1610.5 pg/mL — ABNORMAL HIGH (ref 0.0–100.0)

## 2022-12-04 MED ORDER — APIXABAN 2.5 MG PO TABS
2.5000 mg | ORAL_TABLET | Freq: Two times a day (BID) | ORAL | Status: DC
  Administered 2022-12-04 – 2022-12-07 (×6): 2.5 mg via ORAL
  Filled 2022-12-04 (×6): qty 1

## 2022-12-04 MED ORDER — LORAZEPAM 2 MG/ML IJ SOLN: 0.5000 mg | INTRAMUSCULAR | Status: DC | PRN

## 2022-12-04 MED ORDER — FUROSEMIDE 10 MG/ML IJ SOLN
20.0000 mg | Freq: Once | INTRAMUSCULAR | Status: AC
  Administered 2022-12-04: 20 mg via INTRAVENOUS
  Filled 2022-12-04: qty 2

## 2022-12-04 MED ORDER — LORAZEPAM 2 MG/ML IJ SOLN
0.2500 mg | Freq: Once | INTRAMUSCULAR | Status: DC
  Filled 2022-12-04: qty 1

## 2022-12-04 MED ORDER — ADULT MULTIVITAMIN W/MINERALS CH
1.0000 | ORAL_TABLET | Freq: Every day | ORAL | Status: DC
  Administered 2022-12-04 – 2022-12-07 (×4): 1 via ORAL
  Filled 2022-12-04 (×4): qty 1

## 2022-12-04 MED ORDER — ACETAMINOPHEN 325 MG PO TABS
650.0000 mg | ORAL_TABLET | Freq: Four times a day (QID) | ORAL | Status: DC | PRN
  Administered 2022-12-04 – 2022-12-06 (×2): 650 mg via ORAL
  Filled 2022-12-04 (×2): qty 2

## 2022-12-04 MED ORDER — IPRATROPIUM-ALBUTEROL 0.5-2.5 (3) MG/3ML IN SOLN
3.0000 mL | Freq: Three times a day (TID) | RESPIRATORY_TRACT | Status: DC
  Administered 2022-12-05 (×2): 3 mL via RESPIRATORY_TRACT
  Filled 2022-12-04 (×2): qty 3

## 2022-12-04 NOTE — Consult Note (Signed)
[place] for heparin drip Indication: pulmonary embolus  No Known Allergies  Patient Measurements: Weight: 43.3 kg (95 lb 8 oz) Heparin Dosing Weight: 43.3 kg  Vital Signs: Temp: 97.4 F (36.3 C) (12/27 0129) Temp Source: Oral (12/26 2235) BP: 162/99 (12/27 0129) Pulse Rate: 75 (12/27 0129)  Labs: Recent Labs    12/02/22 0631 12/02/22 0834 12/02/22 1201 12/03/22 0448 12/03/22 1857 12/04/22 0308  HGB 9.4*  --   --  9.7*  --  9.7*  HCT 32.5*  --   --  32.4*  --  31.1*  PLT 260  --   --  276  --  302  APTT  --   --   --   --  31  --   LABPROT 13.9  --   --   --  12.8  --   INR 1.1  --   --   --  1.0  --   HEPARINUNFRC  --   --   --   --   --  0.80*  CREATININE 1.70*  --   --  1.38*  --  1.32*  TROPONINIHS 22* 20* 24*  --   --   --      Estimated Creatinine Clearance (by C-G formula based on SCr of 1.32 mg/dL (H)) Female: 17 mL/min (A) Female: 20 mL/min (A)   Medical History: Past Medical History:  Diagnosis Date   Arthritis    hand   Chronic systolic CHF (congestive heart failure) (HCC)    GERD (gastroesophageal reflux disease)    Gout    hand   Gross hematuria    Hematoma of kidney    History of kidney stones    HTN (hypertension)    controlled on meds   Hydronephrosis    Hypercholesteremia    Kidney stones    Nausea    Transfusion history    Ureteral stone     Medications:  No home anticoagulation per pharmacist review  Assessment: 88 yo female presented to the ED with AMS, cough and SOB.  Patient was found to be Influenza A positive and have elevated D-dimer.  Patient's VQ scan showed high probability of PE.   Baseline labs: hgb = 9.7, plt = 276, aPTT and INR pending  Goal of Therapy:  Heparin level 0.3-0.7 units/ml Monitor platelets by anticoagulation protocol: Yes   12/27 0308 HL 0.8, supratherapeutic  Plan: Decrease heparin infusion to 650 units/hr Recheck HL in 8 hr after rate change CBC daily  while on heparin   Renda Rolls, PharmD, [facility] 12/04/2022 4:33 AM

## 2022-12-04 NOTE — Progress Notes (Signed)
Initial Nutrition Assessment  DOCUMENTATION CODES:   Severe malnutrition in context of chronic illness, Underweight  INTERVENTION:   -Continue Ensure Enlive po BID, each supplement provides 350 kcal and 20 grams of protein.  -MVI with minerals daily  NUTRITION DIAGNOSIS:   Severe Malnutrition related to chronic illness (dementia) as evidenced by severe fat depletion, severe muscle depletion.  GOAL:   Patient will meet greater than or equal to 90% of their needs  MONITOR:   PO intake, Supplement acceptance  REASON FOR ASSESSMENT:   Consult Assessment of nutrition requirement/status  ASSESSMENT:   Pt with past medical history of hypertension, severe protein calorie malnutrition, stage IIIb chronic kidney disease and systolic CHF with 88% ejection fraction who presented with confusion and shortness of breath.  Pt admitted with influenza A and rt pulmonary embolus.   Reviewed I/O's: +597 ml x 24 hours and +1.1 L since admission  Pt lying in bed at tiume of visit. Pt lethargic and did not respond to RD questions. No family at bedside.   Observed lunch tray; pt consumed about 60% of meal.    Reviewed wt hx; pt has experienced a 9% wt loss over the past 3 months, which is significant for time frame.   Medications reviewed and include vitamin B-12, ativan, solu-medrol, and 0.9% sodium chloride infusion @ 50 ml/hr.   Labs reviewed: Na: 134, CBGS: 107 (inpatient orders for glycemic control are none).    NUTRITION - FOCUSED PHYSICAL EXAM:  Flowsheet Row Most Recent Value  Orbital Region Severe depletion  Upper Arm Region Severe depletion  Thoracic and Lumbar Region Severe depletion  Buccal Region Severe depletion  Temple Region Severe depletion  Clavicle Bone Region Severe depletion  Clavicle and Acromion Bone Region Severe depletion  Scapular Bone Region Severe depletion  Dorsal Hand Severe depletion  Patellar Region Severe depletion  Anterior Thigh Region Severe  depletion  Posterior Calf Region Severe depletion  Edema (RD Assessment) None  Hair Reviewed  Eyes Reviewed  Mouth Reviewed  Skin Reviewed  Nails Reviewed       Diet Order:   Diet Order             Diet Heart Fluid consistency: Thin  Diet effective now                   EDUCATION NEEDS:   Not appropriate for education at this time  Skin:  Skin Assessment: Reviewed RN Assessment  Last BM:  Unknown  Height:   Ht Readings from Last 1 Encounters:  09/07/22 5\' 8"  (1.727 m)    Weight:   Wt Readings from Last 1 Encounters:  12/02/22 43.3 kg    Ideal Body Weight:  63.6 kg  BMI:  Body mass index is 14.52 kg/m.  Estimated Nutritional Needs:   Kcal:  1300-1500  Protein:  70-85 grams  Fluid:  > 1.3 L    Loistine Chance, RD, LDN, [place] Registered Dietitian II Certified Diabetes Care and Education Specialist Please refer to [facility] for RD and/or RD on-call/weekend/after hours pager

## 2022-12-04 NOTE — Progress Notes (Addendum)
The daughter Elvina Mattes called and stated that her and the family spoke and decided to let the doctor change the patient over to Eliquis from the Heparin gtt and to let the MD know. Made Dr. [place] Pink aware of same.

## 2022-12-04 NOTE — Progress Notes (Signed)
[facility] Progress Note  Patient: Kathy Villanueva    RKY:[id number]  DOA: 12/02/2022    Date of Service: the patient was seen and examined on 12/04/2022  Brief hospital course: Patient is a 88 year old female with past medical history of hypertension, severe protein calorie malnutrition, stage IIIb chronic kidney disease and systolic CHF with 31% ejection fraction who presented to the emergency room on 12/25 with confusion and shortness of breath.  In the emergency room, patient found to be hypoxic initially requiring BiPAP, but then able to be weaned down to 3 L nasal cannula.  Workup revealed positive for flu and mild heart failure.  Patient started on Tamiflu and given mild Lasix and admitted to the hospitalist service.  Workup also revealed elevated D-dimer.  VQ scan done on 12/26 with high probability for right lower lobe pulmonary embolus.  Patient able to be weaned off onto room air.  Family doing on 12/27 to discuss whether patient should be on chronic anticoagulation.   Assessment and Plan: * Influenza A On Tamiflu  Acute respiratory failure with hypoxia (HCC)-resolved as of 12/04/2022 Secondary to flu and less likely blood clot given patient now on room air.  Given elevated procalcitonin, will add antibiotic coverage  Pulmonary embolus, right (HCC) High probability given elevated D-dimer and VQ scan.  Discussed with family and we are meeting tomorrow to evaluate if patient needs to be placed on anticoagulation.  Given overall stability, advanced age, renal failure, protein calorie malnutrition, I am concerned that we may cause more harm than good.  Lower extremity Dopplers have already ruled out DVT, so long-term anticoagulation would be done to prevent future thrombus.  According to family, patient has spent much time in bed and this is likely the cause  Acute metabolic encephalopathy Much improved with treatment of hypoxia.  Intermittent episodes of confusion, she likely may  have some mild underlying dementia/hospital delirium  Chronic systolic CHF (congestive heart failure) ([place]) Patient is slightly dehydrated.  Acute renal failure superimposed on stage 3b chronic kidney disease ([place]) Getting very gentle IV fluids, improving.  Creatinine today at 1.32 with GFR of 37  Thoracic aortic aneurysm (HCC) Stable at this time.  Normocytic anemia Stable  Protein-calorie malnutrition, severe BMI 14.5.  Nutrition consulted.       Body mass index is 14.52 kg/m.        Consultants: None  Procedures: VQ scan noting high probability for right lower lobe pulmonary embolus  Antimicrobials: IV Rocephin and Zithromax 12/26-present  Code Status: Full code   Subjective: Patient resting comfortably, no complaints  Objective: Vital signs were reviewed and unremarkable. Vitals:   12/04/22 1140 12/04/22 1203  BP:  (!) 148/71  Pulse:  72  Resp:  15  Temp:  98.6 F (37 C)  SpO2: 100% 99%    Intake/Output Summary (Last 24 hours) at 12/04/2022 1339 Last data filed at 12/04/2022 0524 Gross per 24 hour  Intake 597.14 ml  Output --  Net 597.14 ml   Filed Weights   12/02/22 1132  Weight: 43.3 kg   Body mass index is 14.52 kg/m.  Exam:  General: Resting comfortably, no acute distress HEENT: Normocephalic, atraumatic, mucous membranes slightly dry Cardiovascular: Regular rate and rhythm, S1-S2, 2 out of 6 systolic ejection murmur Respiratory: Decreased breath sounds throughout Abdomen: Soft, nontender, nondistended, positive bowel sounds Musculoskeletal: No clubbing or cyanosis or edema Skin: no skin breaks, tears or lesions Psychiatry: Appropriate, no evidence of psychoses Neurology: No focal deficits  Data Reviewed:  Creatinine at 1.32 with GFR of 37.  Procalcitonin of 0.21.  Hemoglobin stable at 9.7.  Disposition:  Status is: Inpatient Remains inpatient appropriate because:  -Decision by family to initiate anticoagulation, to be made  by 4 PM 12/27    Anticipated discharge date: 12/28  Family Communication: Multiple family members at the bedside DVT Prophylaxis: Lovenox    Author: Annita Brod ,MD 12/04/2022 1:39 PM  To reach On-call, see care teams to locate the attending and reach out via [email or web address]. Between 7PM-7AM, please contact night-coverage If you still have difficulty reaching the attending provider, please page the [facility] (Director on Call) for [facility] on amion for assistance.

## 2022-12-04 NOTE — Progress Notes (Signed)
  Transition of Care [facility]) Screening Note   Patient Details  Name: Kathy Villanueva Date of Birth: 1924-03-07   Transition of Care [facility]) CM/SW Contact:    Quin Hoop, LCSW Phone Number: 12/04/2022, 12:37 PM    Transition of Care Department [facility]) has reviewed patient and no TOC needs have been identified at this time. We will continue to monitor patient advancement through interdisciplinary progression rounds. If new patient transition needs arise, please place a TOC consult.

## 2022-12-05 ENCOUNTER — Inpatient Hospital Stay (HOSPITAL_COMMUNITY)
Admit: 2022-12-05 | Discharge: 2022-12-05 | Disposition: A | Discharge status: Home / Self Care | Payer: Medicare HMO | Attending: Internal Medicine | Admitting: Internal Medicine

## 2022-12-05 DIAGNOSIS — F03918 Unspecified dementia, unspecified severity, with other behavioral disturbance: Secondary | ICD-10-CM

## 2022-12-05 DIAGNOSIS — I5031 Acute diastolic (congestive) heart failure: Secondary | ICD-10-CM

## 2022-12-05 DIAGNOSIS — J101 Influenza due to other identified influenza virus with other respiratory manifestations: Secondary | ICD-10-CM | POA: Diagnosis not present

## 2022-12-05 DIAGNOSIS — I2699 Other pulmonary embolism without acute cor pulmonale: Secondary | ICD-10-CM | POA: Diagnosis not present

## 2022-12-05 DIAGNOSIS — E43 Unspecified severe protein-calorie malnutrition: Secondary | ICD-10-CM | POA: Diagnosis not present

## 2022-12-05 LAB — CBC
HCT: 30.7 % — ABNORMAL LOW (ref 36.0–46.0)
Hemoglobin: 9.5 g/dL — ABNORMAL LOW (ref 12.0–15.0)
MCH: 27.9 pg (ref 26.0–34.0)
MCHC: 30.9 g/dL (ref 30.0–36.0)
MCV: 90 fL (ref 80.0–100.0)
Platelets: 275 10*3/uL (ref 150–400)
RBC: 3.41 MIL/uL — ABNORMAL LOW (ref 3.87–5.11)
RDW: 16.4 % — ABNORMAL HIGH (ref 11.5–15.5)
WBC: 6.9 10*3/uL (ref 4.0–10.5)
nRBC: 0 % (ref 0.0–0.2)

## 2022-12-05 LAB — BASIC METABOLIC PANEL
Anion gap: 11 (ref 5–15)
BUN: 36 mg/dL — ABNORMAL HIGH (ref 8–23)
CO2: 21 mmol/L — ABNORMAL LOW (ref 22–32)
Calcium: 8 mg/dL — ABNORMAL LOW (ref 8.9–10.3)
Chloride: 102 mmol/L (ref 98–111)
Creatinine, Ser: 1.44 mg/dL — ABNORMAL HIGH (ref 0.44–1.00)
GFR, Estimated: 33 mL/min — ABNORMAL LOW (ref >60)
Glucose, Bld: 108 mg/dL — ABNORMAL HIGH (ref 70–99)
Potassium: 4.4 mmol/L (ref 3.5–5.1)
Sodium: 134 mmol/L — ABNORMAL LOW (ref 135–145)

## 2022-12-05 LAB — ECHOCARDIOGRAM COMPLETE
AR max vel: 1.8 cm2
AV Area VTI: 1.95 cm2
AV Area mean vel: 1.9 cm2
AV Mean grad: 2 mmHg
AV Peak grad: 5.1 mmHg
Ao pk vel: 1.13 m/s
Area-P 1/2: 5.16 cm2
MV VTI: 1.82 cm2
S' Lateral: 2.7 cm
Weight: 1509.71 oz

## 2022-12-05 MED ORDER — FUROSEMIDE 10 MG/ML IJ SOLN
20.0000 mg | Freq: Two times a day (BID) | INTRAMUSCULAR | Status: DC
  Administered 2022-12-05 – 2022-12-06 (×3): 20 mg via INTRAVENOUS
  Filled 2022-12-05 (×3): qty 2

## 2022-12-05 NOTE — Care Management Important Message (Signed)
Important Message  Patient Details  Name: Kathy Villanueva MRN: [id number] Date of Birth: 1924/08/17   Medicare Important Message Given:  N/A - LOS <3 / Initial given by admissions     [name] 12/05/2022, 9:10 AM

## 2022-12-05 NOTE — Progress Notes (Signed)
[facility] Progress Note  Patient: Kathy Villanueva    QMV:[id number]  DOA: 12/02/2022    Date of Service: the patient was seen and examined on 12/05/2022  Brief hospital course: Patient is a 88 year old female with past medical history of hypertension, severe protein calorie malnutrition, stage IIIb chronic kidney disease and previous reported systolic CHF with 28% ejection fraction who presented to the emergency room on 12/25 with confusion and shortness of breath.  In the emergency room, patient found to be hypoxic initially requiring BiPAP, but then able to be weaned down to 3 L nasal cannula.  Workup revealed positive for flu and mild heart failure.  Patient started on Tamiflu and given mild Lasix and admitted to the hospitalist service.  Workup also revealed elevated D-dimer.  VQ scan done on 12/26 with high probability for right lower lobe pulmonary embolus.  Patient able to be weaned off onto room air.  After family meeting on 12/27, family opted for patient to be on anticoagulation.  Eliquis started today.  Patient noted to have persistently elevated blood pressures and BNP checked on 12/27 and found to be elevated at 1600.  Patient started on IV Lasix.  Repeat echocardiogram done noted actually preserved ejection fraction with indeterminate diastolic function.   Assessment and Plan: * Influenza A On Tamiflu  Acute respiratory failure with hypoxia (HCC)-resolved as of 12/04/2022 Secondary to flu and less likely blood clot given patient now on room air.  Given elevated procalcitonin, will add antibiotic coverage  Pulmonary embolus, right (HCC) High probability given elevated D-dimer and VQ scan.  Discussed with family and we are meeting tomorrow to evaluate if patient needs to be placed on anticoagulation.  Lower extremity Dopplers have already ruled out DVT, so long-term anticoagulation would be done to prevent future thrombus and would need to be lifelong.  Family opted for  starting Eliquis which was done 12/28  Senile dementia uncomp, with behavioral disturbance ([place]) Improved with treatment of hypoxia and hypertensive urgency.  Family acknowledges that she does get confused at times when in hospital.  Acute diastolic (congestive) heart failure ([place]) Patient previously had a history of systolic heart failure with ejection fraction of 40%.  When she started having episodes of hypertensive urgency, BNP checked and found to be elevated at 1600.  Echocardiogram checked 12/28 noted preserved ejection fraction and indeterminate diastolic function.  Started on IV Lasix.  Acute renal failure superimposed on stage 3b chronic kidney disease ([place]) Initially was receiving gentle IV fluids which were discontinued when BNP elevated.  Creatinine stable for now.  Thoracic aortic aneurysm (HCC) Stable at this time.  Normocytic anemia Stable  Protein-calorie malnutrition, severe BMI 14.5.  Nutrition Status: Nutrition Problem: Severe Malnutrition Etiology: chronic illness (dementia) Signs/Symptoms: severe fat depletion, severe muscle depletion Interventions: Ensure Enlive (each supplement provides 350kcal and 20 grams of protein), MVI, Liberalize Diet  Appreciate nutrition consult       Body mass index is 14.35 kg/m.  Nutrition Problem: Severe Malnutrition Etiology: chronic illness (dementia)     Consultants: None  Procedures: VQ scan noting high probability for right lower lobe pulmonary embolus  Antimicrobials: IV Rocephin and Zithromax 12/26-present  Code Status: Full code   Subjective: Patient resting comfortably, no complaints  Objective: Vital signs were reviewed and unremarkable. Vitals:   12/05/22 1059 12/05/22 1609  BP: 103/64 116/79  Pulse: 70 69  Resp: 20 16  Temp: 98.4 F (36.9 C) 98.2 F (36.8 C)  SpO2: 96% 100%    Intake/Output  Summary (Last 24 hours) at 12/05/2022 1657 Last data filed at 12/05/2022 0400 Gross per 24 hour   Intake 313.6 ml  Output 850 ml  Net -536.4 ml    Filed Weights   12/02/22 1132 12/04/22 2300  Weight: 43.3 kg 42.8 kg   Body mass index is 14.35 kg/m.  Exam:  General: Resting comfortably, no acute distress HEENT: Normocephalic, atraumatic, mucous membranes slightly dry Cardiovascular: Regular rate and rhythm, S1-S2, 2 out of 6 systolic ejection murmur Respiratory: Decreased breath sounds throughout Abdomen: Soft, nontender, nondistended, positive bowel sounds Musculoskeletal: No clubbing or cyanosis or edema Skin: no skin breaks, tears or lesions Psychiatry: Appropriate, no evidence of psychoses Neurology: No focal deficits  Data Reviewed: BNP at 1600, creatinine of 1.44 with GFR of 33  Disposition:  Status is: Inpatient Remains inpatient appropriate because:  -Diuresing    Anticipated discharge date: 12/29 or 12/30  Family Communication: Updated daughter by phone. DVT Prophylaxis: apixaban (ELIQUIS) tablet 2.5 mg Start: 12/04/22 1600Lovenox apixaban (ELIQUIS) tablet 2.5 mg    Author: Annita Brod ,MD 12/05/2022 4:57 PM  To reach On-call, see care teams to locate the attending and reach out via [email or web address]. Between 7PM-7AM, please contact night-coverage If you still have difficulty reaching the attending provider, please page the [facility] (Director on Call) for [facility] on amion for assistance.

## 2022-12-05 NOTE — Progress Notes (Signed)
*  PRELIMINARY RESULTS* Echocardiogram 2D Echocardiogram has been performed.  Kathy Villanueva 12/05/2022, 3:47 PM

## 2022-12-06 DIAGNOSIS — J101 Influenza due to other identified influenza virus with other respiratory manifestations: Secondary | ICD-10-CM | POA: Diagnosis not present

## 2022-12-06 LAB — BASIC METABOLIC PANEL
Anion gap: 10 (ref 5–15)
BUN: 39 mg/dL — ABNORMAL HIGH (ref 8–23)
CO2: 27 mmol/L (ref 22–32)
Calcium: 8.5 mg/dL — ABNORMAL LOW (ref 8.9–10.3)
Chloride: 100 mmol/L (ref 98–111)
Creatinine, Ser: 1.51 mg/dL — ABNORMAL HIGH (ref 0.44–1.00)
GFR, Estimated: 31 mL/min — ABNORMAL LOW (ref >60)
Glucose, Bld: 114 mg/dL — ABNORMAL HIGH (ref 70–99)
Potassium: 4 mmol/L (ref 3.5–5.1)
Sodium: 137 mmol/L (ref 135–145)

## 2022-12-06 MED ORDER — PREDNISONE 20 MG PO TABS
20.0000 mg | ORAL_TABLET | Freq: Every day | ORAL | Status: DC
  Administered 2022-12-06 – 2022-12-07 (×2): 20 mg via ORAL
  Filled 2022-12-06 (×2): qty 1

## 2022-12-06 NOTE — Progress Notes (Signed)
[facility] [place] NAME: Kathy Villanueva    MR#:  [id number]  DATE OF BIRTH:  Feb 28, 1924  SUBJECTIVE:   Poor historian. No family at bedside. On room air. No respiratory distress noted. PO diet   VITALS:  Blood pressure (!) 144/91, pulse (!) 57, temperature 98 F (36.7 C), resp. rate 19, weight 42.1 kg, SpO2 98 %.  PHYSICAL EXAMINATION:   GENERAL:  88 y.o.-year-old patient lying in the bed with no acute distress.  LUNGS: decreased breath sounds bilaterally, no wheezing CARDIOVASCULAR: S1, S2 normal. No murmur   EXTREMITIES: No  edema b/l.    NEUROLOGIC: nonfocal  patient is alert   LABORATORY PANEL:  CBC Recent Labs  Lab 12/05/22 0419  WBC 6.9  HGB 9.5*  HCT 30.7*  PLT 275    Chemistries  Recent Labs  Lab 12/02/22 0631 12/03/22 0448 12/06/22 0436  NA 141   < > 137  K 4.6   < > 4.0  CL 110   < > 100  CO2 22   < > 27  GLUCOSE 86   < > 114*  BUN 41*   < > 39*  CREATININE 1.70*   < > 1.51*  CALCIUM 8.2*   < > 8.5*  AST 49*  --   --   ALT 20  --   --   ALKPHOS 75  --   --   BILITOT 0.8  --   --    < > = values in this interval not displayed.   Cardiac Enzymes No results for input(s): "TROPONINI" in the last 168 hours. RADIOLOGY:  ECHOCARDIOGRAM COMPLETE  Result Date: 12/05/2022    ECHOCARDIOGRAM REPORT   Patient Name:   Kathy Villanueva Date of Exam: 12/05/2022 Medical Rec #:  [id number]      Height:       68.0 in Accession #:    [id number]     Weight:       94.4 lb Date of Birth:  12-Apr-1924       BSA:          1.485 m Patient Age:    67 years       BP:           103/64 mmHg Patient Gender: F              HR:           72 bpm. Exam Location:  [facility] Procedure: 2D Echo, Cardiac Doppler and Color Doppler Indications:     CHF  History:         Patient has no prior history of Echocardiogram examinations.                  CHF, Previous Myocardial Infarction, Signs/Symptoms:Chest Pain;                  Risk Factors:Hypertension.  CKD, Flu +.  Sonographer:     Wenda Low Referring Phys:  [place] Diagnosing Phys: Kathlyn Sacramento MD  Sonographer Comments: Technically challenging study due to limited acoustic windows and suboptimal apical window. Image acquisition challenging due to patient body habitus. IMPRESSIONS  1. Left ventricular ejection fraction, by estimation, is 55 to 60%. The left ventricle has normal function. Left ventricular endocardial border not optimally defined to evaluate regional wall motion. Left ventricular diastolic parameters are indeterminate.  2. Right ventricular systolic function is normal. The right ventricular size is normal.  There is moderately elevated pulmonary artery systolic pressure.  3. The mitral valve is normal in structure. Mild mitral valve regurgitation. No evidence of mitral stenosis.  4. Tricuspid valve regurgitation is mild to moderate.  5. The aortic valve is calcified. Aortic valve regurgitation is mild to moderate. Aortic valve sclerosis/calcification is present, without any evidence of aortic stenosis.  6. The inferior vena cava is normal in size with <50% respiratory variability, suggesting right atrial pressure of 8 mmHg. FINDINGS  Left Ventricle: Left ventricular ejection fraction, by estimation, is 55 to 60%. The left ventricle has normal function. Left ventricular endocardial border not optimally defined to evaluate regional wall motion. The left ventricular internal cavity size was normal in size. There is no left ventricular hypertrophy. Left ventricular diastolic parameters are indeterminate. Right Ventricle: The right ventricular size is normal. No increase in right ventricular wall thickness. Right ventricular systolic function is normal. There is moderately elevated pulmonary artery systolic pressure. The tricuspid regurgitant velocity is 3.08 m/s, and with an assumed right atrial pressure of 8 mmHg, the estimated right ventricular systolic pressure is 24.2 mmHg.  Left Atrium: Left atrial size was normal in size. Right Atrium: Right atrial size was normal in size. Pericardium: There is no evidence of pericardial effusion. Mitral Valve: The mitral valve is normal in structure. Mild mitral valve regurgitation. No evidence of mitral valve stenosis. MV peak gradient, 3.4 mmHg. The mean mitral valve gradient is 1.0 mmHg. Tricuspid Valve: The tricuspid valve is normal in structure. Tricuspid valve regurgitation is mild to moderate. No evidence of tricuspid stenosis. Aortic Valve: The aortic valve is calcified. Aortic valve regurgitation is mild to moderate. Aortic valve sclerosis/calcification is present, without any evidence of aortic stenosis. Aortic valve mean gradient measures 2.0 mmHg. Aortic valve peak gradient measures 5.1 mmHg. Aortic valve area, by VTI measures 1.95 cm. Pulmonic Valve: The pulmonic valve was normal in structure. Pulmonic valve regurgitation is mild. No evidence of pulmonic stenosis. Aorta: The aortic root is normal in size and structure. Venous: The inferior vena cava is normal in size with less than 50% respiratory variability, suggesting right atrial pressure of 8 mmHg. IAS/Shunts: No atrial level shunt detected by color flow Doppler.  LEFT VENTRICLE PLAX 2D LVIDd:         3.70 cm   Diastology LVIDs:         2.70 cm   LV e' medial:   5.98 cm/s LV PW:         1.40 cm   LV E/e' medial: 8.8 LV IVS:        0.90 cm LVOT diam:     1.90 cm LV SV:         44 LV SV Index:   29 LVOT Area:     2.84 cm  LEFT ATRIUM         Index LA diam:    2.30 cm 1.55 cm/m  AORTIC VALVE                    PULMONIC VALVE AV Area (Vmax):    1.80 cm     PV Vmax:       0.89 m/s AV Area (Vmean):   1.90 cm     PV Peak grad:  3.2 mmHg AV Area (VTI):     1.95 cm AV Vmax:           113.00 cm/s AV Vmean:          68.500 cm/s AV  VTI:            0.224 m AV Peak Grad:      5.1 mmHg AV Mean Grad:      2.0 mmHg LVOT Vmax:         71.90 cm/s LVOT Vmean:        45.800 cm/s LVOT VTI:           0.154 m LVOT/AV VTI ratio: 0.69  AORTA Ao Root diam: 3.10 cm Ao Asc diam:  2.90 cm MITRAL VALVE               TRICUSPID VALVE MV Area (PHT): 5.16 cm    TR Peak grad:   37.9 mmHg MV Area VTI:   1.82 cm    TR Vmax:        308.00 cm/s MV Peak grad:  3.4 mmHg MV Mean grad:  1.0 mmHg    SHUNTS MV Vmax:       0.93 m/s    Systemic VTI:  0.15 m MV Vmean:      51.4 cm/s   Systemic Diam: 1.90 cm MV Decel Time: 147 msec MV E velocity: 52.90 cm/s MV A velocity: 60.80 cm/s MV E/A ratio:  0.87 Kathlyn Sacramento MD Electronically signed by Kathlyn Sacramento MD Signature Date/Time: 12/05/2022/4:30:19 PM    Final     Assessment and Plan  88 year old female with past medical history of hypertension, severe protein calorie malnutrition, stage IIIb chronic kidney disease and previous reported systolic CHF with 60% ejection fraction who presented to the emergency room on 12/25 with confusion and shortness of breath. In the emergency room, patient found to be hypoxic initially requiring BiPAP, but then able to be weaned down to 3 L nasal cannula. Workup revealed positive for flu and mild heart failure. Patient started on Tamiflu and given mild Lasix and admitted to the hospitalist service. Workup also revealed elevated D-dimer. VQ scan done on 12/26 with high probability for right lower lobe pulmonary embolus. Patient able to be weaned off onto room air. After family meeting on 12/27, family opted for patient to be on anticoagulation. Eliquis started,  Influenza A On Tamiflu--completed --on RA   Acute respiratory failure with hypoxia (HCC)-resolved as of 12/04/2022 Secondary to flu and less likely blood clot given patient now on room air.  Given elevated procalcitonin, will add antibiotic coverage--completed 5 days   Pulmonary embolus, right (HCC) High probability given elevated D-dimer and VQ scan.   Dr [place] Pink Discussed with family opted for starting Eliquis which was done 12/28 -- Lower extremity Dopplers have already  ruled out DVT   Senile dementia uncomp, with behavioral disturbance ([place]) Improved with treatment of hypoxia and hypertensive urgency.  Family acknowledges that she does get confused at times when in hospital.   Acute diastolic (congestive) heart failure ([place]) Patient previously had a history of systolic heart failure with ejection fraction of 40%.  When she started having episodes of hypertensive urgency, BNP checked and found to be elevated at 1600.  Echocardiogram checked 12/28 noted preserved ejection fraction and indeterminate diastolic function.  Started on IV Lasix--diuresed ok. Sats on RA. Will d/c lasix   Acute renal failure superimposed on stage 3b chronic kidney disease ([place])  Creatinine stable for now.   Thoracic aortic aneurysm (HCC) Stable at this time.   Normocytic anemia Stable   Protein-calorie malnutrition, severe BMI 14.5.  Nutrition Status: Nutrition Problem: Severe Malnutrition Etiology: chronic illness (dementia) Signs/Symptoms: severe fat depletion, severe muscle depletion Interventions:  Ensure Enlive (each supplement provides 350kcal and 20 grams of protein), MVI, Liberalize Diet   weakness.-- At baseline per daughter Inez Catalina patient is chair/bedbound. She needs help ambulating. PT to see patient. -- Daughter wants patient to go home. Will arrange PT OT for home.     Family communication :Dter Bascom Levels CODE STATUS: full DVT Prophylaxis :eliquis Level of care: Telemetry Medical Status is: Inpatient Remains inpatient appropriate because: anticipate discharge tomorrow after seen by PT. Daughter Inez Catalina is in agreement    TOTAL TIME TAKING CARE OF THIS PATIENT: 35 minutes.  >50% time spent on counselling and coordination of care  Note: This dictation was prepared with Dragon dictation along with smaller phrase technology. Any transcriptional errors that result from this process are unintentional.  Fritzi Mandes M.D    [facility]   CC: Primary  care physician; Baxter Hire, MD

## 2022-12-06 NOTE — Care Management Important Message (Signed)
Important Message  Patient Details  Name: Kathy Villanueva MRN: [id number] Date of Birth: [birth year]   Medicare Important Message Given:  Yes     Juliann Pulse A Vansh Reckart 12/06/2022, 1:10 PM

## 2022-12-07 DIAGNOSIS — J101 Influenza due to other identified influenza virus with other respiratory manifestations: Secondary | ICD-10-CM | POA: Diagnosis not present

## 2022-12-07 LAB — CULTURE, BLOOD (ROUTINE X 2)
Culture: NO GROWTH
Culture: NO GROWTH
Special Requests: ADEQUATE
Special Requests: ADEQUATE

## 2022-12-07 MED ORDER — APIXABAN 2.5 MG PO TABS: 2.5000 mg | ORAL_TABLET | Freq: Two times a day (BID) | ORAL | 1 refills | Status: AC

## 2022-12-07 MED ORDER — ADULT MULTIVITAMIN W/MINERALS CH: 1.0000 | ORAL_TABLET | Freq: Every day | ORAL | 0 refills | Status: AC

## 2022-12-07 MED ORDER — ENSURE ENLIVE PO LIQD: 237.0000 mL | Freq: Two times a day (BID) | ORAL | 12 refills | Status: AC

## 2022-12-07 MED ORDER — DM-GUAIFENESIN ER 30-600 MG PO TB12: 1.0000 | ORAL_TABLET | Freq: Two times a day (BID) | ORAL | 0 refills | Status: DC | PRN

## 2022-12-07 NOTE — Discharge Summary (Signed)
Physician Discharge Summary   Patient: Kathy Villanueva MRN: [id number] DOB: 02/07/1924  Admit date:     12/02/2022  Discharge date: 12/07/22  Discharge Physician: Fritzi Mandes   PCP: Baxter Hire, MD   Recommendations at discharge:    Resume previous home health services follow-up PCP in 1 to 2 weeks  Discharge Diagnoses: Principal Problem:   Influenza A Active Problems:   Pulmonary embolus, right (HCC)   Senile dementia uncomp, with behavioral disturbance (HCC)   Acute diastolic (congestive) heart failure (HCC)   Acute renal failure superimposed on stage 3b chronic kidney disease (HCC)   Normocytic anemia   Thoracic aortic aneurysm (HCC)   Protein-calorie malnutrition, severe   HTN (hypertension)   Pulmonary embolism [facility])   Hospital Course: 88 year old female with past medical history of hypertension, severe protein calorie malnutrition, stage IIIb chronic kidney disease and previous reported systolic CHF with 23% ejection fraction who presented to the emergency room on 12/25 with confusion and shortness of breath. In the emergency room, patient found to be hypoxic initially requiring BiPAP, but then able to be weaned down to 3 L nasal cannula. Workup revealed positive for flu and mild heart failure. Patient started on Tamiflu and given mild Lasix and admitted to the hospitalist service. Workup also revealed elevated D-dimer. VQ scan done on 12/26 with high probability for right lower lobe pulmonary embolus. Patient able to be weaned off onto room air. After family meeting on 12/27, family opted for patient to be on anticoagulation. Eliquis started,   Influenza A On Tamiflu--completed --on RA   Acute respiratory failure with hypoxia (HCC)-resolved as of 12/04/2022 Secondary to flu and less likely blood clot given patient now on room air.  Given elevated procalcitonin,  antibiotic coverage--completed 5 days   Pulmonary embolus, right (HCC) High probability given elevated  D-dimer and VQ scan.   Dr [place] Pink Discussed with family opted for starting Eliquis which was done 12/28 -- Lower extremity Dopplers have already ruled out DVT   Senile dementia uncomp, with behavioral disturbance ([place]) Improved with treatment of hypoxia and hypertensive urgency.  Family acknowledges that she does get confused at times when in hospital.   Acute diastolic (congestive) heart failure ([place]) Patient previously had a history of systolic heart failure with ejection fraction of 40%.  When she started having episodes of hypertensive urgency, BNP checked and found to be elevated at 1600.  Echocardiogram checked 12/28 noted preserved ejection fraction and indeterminate diastolic function.  Started on IV Lasix--diuresed ok. Sats on RA. Will d/c lasix for now   Acute renal failure superimposed on stage 3b chronic kidney disease ([place])  Creatinine stable for now.   Thoracic aortic aneurysm (HCC) Stable at this time.   Normocytic anemia Stable   Protein-calorie malnutrition, severe BMI 14.5.  Nutrition Status: Nutrition Problem: Severe Malnutrition Etiology: chronic illness (dementia) Signs/Symptoms: severe fat depletion, severe muscle depletion Interventions: Ensure Enlive (each supplement provides 350kcal and 20 grams of protein), MVI, Liberalize Diet    weakness.-- At baseline per daughter Inez Catalina patient is chair/bedbound. She needs help ambulating. PT to see patient. -- Daughter wants patient to go home. Will resume PT OT for home.       Family communication :Dter Bascom Levels CODE STATUS: full DVT Prophylaxis :eliquis Level of care: Telemetry Medical Status is: Inpatient Remains inpatient appropriate because: anticipate discharge today Daughter Inez Catalina is in agreement Pt was seen by PT    Disposition: Home health Diet recommendation:  Discharge Diet Orders (From admission,  onward)     Start     Ordered   12/07/22 0000  Diet - low sodium heart healthy        12/07/22  1205           Cardiac diet DISCHARGE MEDICATION: Allergies as of 12/07/2022   No Known Allergies      Medication List     TAKE these medications    apixaban 2.5 MG Tabs tablet Commonly known as: ELIQUIS Take 1 tablet (2.5 mg total) by mouth 2 (two) times daily.   carvedilol 3.125 MG tablet Commonly known as: COREG Take 3.125 mg by mouth 2 (two) times daily with a meal.   cyanocobalamin 1000 MCG tablet Commonly known as: VITAMIN B12 Take 1 tablet by mouth daily. What changed: Another medication with the same name was removed. Continue taking this medication, and follow the directions you see here.   dextromethorphan-guaiFENesin 30-600 MG 12hr tablet Commonly known as: MUCINEX DM Take 1 tablet by mouth 2 (two) times daily as needed for cough.   feeding supplement Liqd Take 237 mLs by mouth 2 (two) times daily between meals. Start taking on: December 08, 2022   multivitamin with minerals Tabs tablet Take 1 tablet by mouth daily. Start taking on: December 08, 2022   pantoprazole 40 MG tablet Commonly known as: Protonix Take 1 tablet (40 mg total) by mouth daily.   polyethylene glycol 17 g packet Commonly known as: MiraLax Take 17 g by mouth daily.        Follow-up Information     Baxter Hire, MD. Schedule an appointment as soon as possible for a visit in 1 week(s).   Specialty: Internal Medicine Why: hospital f/u Contact information: [address] [place] [address] [phone]                Discharge Exam: Danley Danker Weights   12/02/22 1132 12/04/22 2300 12/06/22 0341  Weight: 43.3 kg 42.8 kg 42.1 kg   GENERAL:  88 y.o.-year-old patient lying in the bed with no acute distress.  LUNGS: decreased breath sounds bilaterally, no wheezing CARDIOVASCULAR: S1, S2 normal. No murmur   EXTREMITIES: No  edema b/l.    NEUROLOGIC: nonfocal  patient is alert   Condition at discharge: fair  The results of significant diagnostics from  this hospitalization (including imaging, microbiology, ancillary and laboratory) are listed below for reference.   Imaging Studies: ECHOCARDIOGRAM COMPLETE  Result Date: 12/05/2022    ECHOCARDIOGRAM REPORT   Patient Name:   Kathy Villanueva Date of Exam: 12/05/2022 Medical Rec #:  [id number]      Height:       68.0 in Accession #:    [id number]     Weight:       94.4 lb Date of Birth:  12/31/1923       BSA:          1.485 m Patient Age:    58 years       BP:           103/64 mmHg Patient Gender: F              HR:           72 bpm. Exam Location:  [facility] Procedure: 2D Echo, Cardiac Doppler and Color Doppler Indications:     CHF  History:         Patient has no prior history of Echocardiogram examinations.  CHF, Previous Myocardial Infarction, Signs/Symptoms:Chest Pain;                  Risk Factors:Hypertension. CKD, Flu +.  Sonographer:     Wenda Low Referring Phys:  [place] Diagnosing Phys: Kathlyn Sacramento MD  Sonographer Comments: Technically challenging study due to limited acoustic windows and suboptimal apical window. Image acquisition challenging due to patient body habitus. IMPRESSIONS  1. Left ventricular ejection fraction, by estimation, is 55 to 60%. The left ventricle has normal function. Left ventricular endocardial border not optimally defined to evaluate regional wall motion. Left ventricular diastolic parameters are indeterminate.  2. Right ventricular systolic function is normal. The right ventricular size is normal. There is moderately elevated pulmonary artery systolic pressure.  3. The mitral valve is normal in structure. Mild mitral valve regurgitation. No evidence of mitral stenosis.  4. Tricuspid valve regurgitation is mild to moderate.  5. The aortic valve is calcified. Aortic valve regurgitation is mild to moderate. Aortic valve sclerosis/calcification is present, without any evidence of aortic stenosis.  6. The inferior vena cava is normal in size with  <50% respiratory variability, suggesting right atrial pressure of 8 mmHg. FINDINGS  Left Ventricle: Left ventricular ejection fraction, by estimation, is 55 to 60%. The left ventricle has normal function. Left ventricular endocardial border not optimally defined to evaluate regional wall motion. The left ventricular internal cavity size was normal in size. There is no left ventricular hypertrophy. Left ventricular diastolic parameters are indeterminate. Right Ventricle: The right ventricular size is normal. No increase in right ventricular wall thickness. Right ventricular systolic function is normal. There is moderately elevated pulmonary artery systolic pressure. The tricuspid regurgitant velocity is 3.08 m/s, and with an assumed right atrial pressure of 8 mmHg, the estimated right ventricular systolic pressure is 09.8 mmHg. Left Atrium: Left atrial size was normal in size. Right Atrium: Right atrial size was normal in size. Pericardium: There is no evidence of pericardial effusion. Mitral Valve: The mitral valve is normal in structure. Mild mitral valve regurgitation. No evidence of mitral valve stenosis. MV peak gradient, 3.4 mmHg. The mean mitral valve gradient is 1.0 mmHg. Tricuspid Valve: The tricuspid valve is normal in structure. Tricuspid valve regurgitation is mild to moderate. No evidence of tricuspid stenosis. Aortic Valve: The aortic valve is calcified. Aortic valve regurgitation is mild to moderate. Aortic valve sclerosis/calcification is present, without any evidence of aortic stenosis. Aortic valve mean gradient measures 2.0 mmHg. Aortic valve peak gradient measures 5.1 mmHg. Aortic valve area, by VTI measures 1.95 cm. Pulmonic Valve: The pulmonic valve was normal in structure. Pulmonic valve regurgitation is mild. No evidence of pulmonic stenosis. Aorta: The aortic root is normal in size and structure. Venous: The inferior vena cava is normal in size with less than 50% respiratory variability,  suggesting right atrial pressure of 8 mmHg. IAS/Shunts: No atrial level shunt detected by color flow Doppler.  LEFT VENTRICLE PLAX 2D LVIDd:         3.70 cm   Diastology LVIDs:         2.70 cm   LV e' medial:   5.98 cm/s LV PW:         1.40 cm   LV E/e' medial: 8.8 LV IVS:        0.90 cm LVOT diam:     1.90 cm LV SV:         44 LV SV Index:   29 LVOT Area:  2.84 cm  LEFT ATRIUM         Index LA diam:    2.30 cm 1.55 cm/m  AORTIC VALVE                    PULMONIC VALVE AV Area (Vmax):    1.80 cm     PV Vmax:       0.89 m/s AV Area (Vmean):   1.90 cm     PV Peak grad:  3.2 mmHg AV Area (VTI):     1.95 cm AV Vmax:           113.00 cm/s AV Vmean:          68.500 cm/s AV VTI:            0.224 m AV Peak Grad:      5.1 mmHg AV Mean Grad:      2.0 mmHg LVOT Vmax:         71.90 cm/s LVOT Vmean:        45.800 cm/s LVOT VTI:          0.154 m LVOT/AV VTI ratio: 0.69  AORTA Ao Root diam: 3.10 cm Ao Asc diam:  2.90 cm MITRAL VALVE               TRICUSPID VALVE MV Area (PHT): 5.16 cm    TR Peak grad:   37.9 mmHg MV Area VTI:   1.82 cm    TR Vmax:        308.00 cm/s MV Peak grad:  3.4 mmHg MV Mean grad:  1.0 mmHg    SHUNTS MV Vmax:       0.93 m/s    Systemic VTI:  0.15 m MV Vmean:      51.4 cm/s   Systemic Diam: 1.90 cm MV Decel Time: 147 msec MV E velocity: 52.90 cm/s MV A velocity: 60.80 cm/s MV E/A ratio:  0.87 Kathlyn Sacramento MD Electronically signed by Kathlyn Sacramento MD Signature Date/Time: 12/05/2022/4:30:19 PM    Final    NM Pulmonary Perfusion  Result Date: 12/03/2022 CLINICAL DATA:  Pulmonary embolism (PE) suspected, high prob EXAM: NUCLEAR MEDICINE PERFUSION LUNG SCAN TECHNIQUE: Perfusion images were obtained in multiple projections after intravenous injection of radiopharmaceutical. Ventilation scans intentionally deferred if perfusion scan and chest x-ray adequate for interpretation during COVID 19 epidemic. RADIOPHARMACEUTICALS:  4.1 mCi Tc-13m MAA IV COMPARISON:  Chest x-ray 12/03/2022 FINDINGS:  Heterogeneous distribution of radiotracer with large wedge-shaped perfusion defect at the right lung base. Additional subsegmental sized perfusion defects bilaterally. IMPRESSION: High probability for pulmonary embolism. Electronically Signed   By: Davina Poke D.O.   On: 12/03/2022 12:34   DG Chest Port 1 View  Result Date: 12/03/2022 CLINICAL DATA:  Acute respiratory failure, hypoxia EXAM: PORTABLE CHEST 1 VIEW COMPARISON:  12/02/2022 FINDINGS: Right lower lobe airspace disease which may reflect atelectasis versus pneumonia. Lungs are hyperinflated as can be seen with COPD. No focal consolidation. No pleural effusion or pneumothorax. Heart and mediastinal contours are unremarkable. Aortic arch measures 4.5 cm. No acute osseous abnormality. IMPRESSION: 1. Right lower lobe airspace disease which may reflect atelectasis versus pneumonia. 2. Aortic arch measures 4.5 cm. Recommend further characterization with a CT of the chest. Electronically Signed   By: Kathreen Devoid M.D.   On: 12/03/2022 08:37   US Venous Img Lower Bilateral (DVT)  Result Date: 12/02/2022 CLINICAL DATA:  Positive D-dimer EXAM: BILATERAL LOWER EXTREMITY VENOUS DOPPLER ULTRASOUND TECHNIQUE: Gray-scale sonography with compression,  as well as color and duplex ultrasound, were performed to evaluate the deep venous system(s) from the level of the common femoral vein through the popliteal and proximal calf veins. COMPARISON:  None Available. FINDINGS: VENOUS Normal compressibility of the common femoral, superficial femoral, and popliteal veins, as well as the visualized calf veins. Visualized portions of profunda femoral vein and great saphenous vein unremarkable. No filling defects to suggest DVT on grayscale or color Doppler imaging. Doppler waveforms show normal direction of venous flow, normal respiratory plasticity and response to augmentation. OTHER None. Limitations: Evaluation is limited due to patient cooperation and body habitus.  There is suboptimal visualization of the left calf veins. IMPRESSION: 1. No evidence of deep venous thrombosis within either lower extremity. Electronically Signed   By: Randa Ngo M.D.   On: 12/02/2022 16:47   CT ABDOMEN PELVIS WO CONTRAST  Result Date: 12/02/2022 CLINICAL DATA:  Abdominal pain EXAM: CT ABDOMEN AND PELVIS WITHOUT CONTRAST TECHNIQUE: Multidetector CT imaging of the abdomen and pelvis was performed following the standard protocol without IV contrast. RADIATION DOSE REDUCTION: This exam was performed according to the departmental dose-optimization program which includes automated exposure control, adjustment of the mA and/or kV according to patient size and/or use of iterative reconstruction technique. COMPARISON:  CT abdomen and pelvis dated October 01, 2021 FINDINGS: Lower chest: Bilateral bronchiectasis with scattered areas of mucous plugging and atelectasis. Hepatobiliary: No focal liver abnormality is seen. Status post cholecystectomy. No biliary dilatation. Pancreas: Unremarkable. No pancreatic ductal dilatation or surrounding inflammatory changes. Spleen: Normal in size without focal abnormality. Adrenals/Urinary Tract: Bilateral adrenal glands are unremarkable. Bilateral cortical atrophy. Malrotated kidney with scarring. No hydronephrosis. Bladder is unremarkable. Stomach/Bowel: Stomach is within normal limits. Appendix appears normal. No evidence of bowel wall thickening, distention, or inflammatory changes. Vascular/Lymphatic: Aortic atherosclerosis. No enlarged abdominal or pelvic lymph nodes. Reproductive: No adnexal masses. Other: No abdominal wall hernia or abnormality. No abdominopelvic ascites. Musculoskeletal: Dextrocurvature of the lumbar spine. No acute or significant osseous findings. IMPRESSION: 1. No acute findings in the abdomen or pelvis. 2. Bilateral bronchiectasis with scattered areas of mucous plugging and atelectasis, likely due to chronic aspiration. 3. Aortic  Atherosclerosis (ICD10-I70.0). Electronically Signed   By: Yetta Glassman M.D.   On: 12/02/2022 13:32   CT HEAD WO CONTRAST (5MM)  Result Date: 12/02/2022 CLINICAL DATA:  Mental status change of unknown cause. EXAM: CT HEAD WITHOUT CONTRAST TECHNIQUE: Contiguous axial images were obtained from the base of the skull through the vertex without intravenous contrast. RADIATION DOSE REDUCTION: This exam was performed according to the departmental dose-optimization program which includes automated exposure control, adjustment of the mA and/or kV according to patient size and/or use of iterative reconstruction technique. COMPARISON:  05/26/2018. FINDINGS: Brain: No evidence of acute infarction, hemorrhage, hydrocephalus, extra-axial collection or mass lesion/mass effect. Sulcal enlargement and widened subarachnoid spaces have increased since the prior exam consistent with atrophy. Vascular: No hyperdense vessel or unexpected calcification. Skull: Normal. Negative for fracture or focal lesion. Sinuses/Orbits: Visualized globes and orbits are unremarkable. Mild to moderate right and mild left ethmoid sinus mucosal thickening. Mild right and minor left maxillary sinus mucosal thickening. Mild mucosal thickening with dependent fluid in the sphenoid sinuses. Other: None. IMPRESSION: 1. No acute intracranial abnormalities. 2. Atrophy/volume loss increased when compared to the CT from 05/26/2018. 3. Sinus disease as detailed. Electronically Signed   By: Lajean Manes M.D.   On: 12/02/2022 12:05   DG Chest Portable 1 View  Result Date: 12/02/2022 CLINICAL  DATA:  Respiratory distress. EXAM: PORTABLE CHEST 1 VIEW COMPARISON:  09/27/2021 FINDINGS: Lungs are hyperexpanded. Prominent thoracic aorta is stable. The lungs are clear without focal pneumonia, edema, pneumothorax or pleural effusion. The visualized bony structures of the thorax are unremarkable. Telemetry leads overlie the chest. IMPRESSION: Hyperexpansion without  acute cardiopulmonary findings. Prominent thoracic aorta measuring up to 4.5 cm diameter in the transverse segment, consistent with aneurysm. Transverse aorta measured 3.7 cm diameter on CT chest 03/15/2008. Follow-up CT chest could be used to further characterize as clinically warranted. Electronically Signed   By: Misty Stanley M.D.   On: 12/02/2022 07:41    Microbiology: Results for orders placed or performed during the hospital encounter of 12/02/22  Resp panel by RT-PCR (RSV, Flu A&B, Covid) Anterior Nasal Swab     Status: Abnormal   Collection Time: 12/02/22  6:30 AM   Specimen: Anterior Nasal Swab  Result Value Ref Range Status   SARS Coronavirus 2 by RT PCR NEGATIVE NEGATIVE Final    Comment: (NOTE) SARS-CoV-2 target nucleic acids are NOT DETECTED.  The SARS-CoV-2 RNA is generally detectable in upper respiratory specimens during the acute phase of infection. The lowest concentration of SARS-CoV-2 viral copies this assay can detect is 138 copies/mL. A negative result does not preclude SARS-Cov-2 infection and should not be used as the sole basis for treatment or other patient management decisions. A negative result may occur with  improper specimen collection/handling, submission of specimen other than nasopharyngeal swab, presence of viral mutation(s) within the areas targeted by this assay, and inadequate number of viral copies(<138 copies/mL). A negative result must be combined with clinical observations, patient history, and epidemiological information. The expected result is Negative.  Fact Sheet for Patients:  EntrepreneurPulse.com.au  Fact Sheet for Healthcare Providers:  IncredibleEmployment.be  This test is no t yet approved or cleared by the [place] FDA and  has been authorized for detection and/or diagnosis of SARS-CoV-2 by FDA under an Emergency Use Authorization (EUA). This EUA will remain  in effect (meaning this test can  be used) for the duration of the COVID-19 declaration under Section 564(b)(1) of the Act, 21 U.S.C.section 360bbb-3(b)(1), unless the authorization is terminated  or revoked sooner.       Influenza A by PCR POSITIVE (A) NEGATIVE Final   Influenza B by PCR NEGATIVE NEGATIVE Final    Comment: (NOTE) The Xpert Xpress SARS-CoV-2/FLU/RSV plus assay is intended as an aid in the diagnosis of influenza from Nasopharyngeal swab specimens and should not be used as a sole basis for treatment. Nasal washings and aspirates are unacceptable for Xpert Xpress SARS-CoV-2/FLU/RSV testing.  Fact Sheet for Patients: EntrepreneurPulse.com.au  Fact Sheet for Healthcare Providers: IncredibleEmployment.be  This test is not yet approved or cleared by the [place] FDA and has been authorized for detection and/or diagnosis of SARS-CoV-2 by FDA under an Emergency Use Authorization (EUA). This EUA will remain in effect (meaning this test can be used) for the duration of the COVID-19 declaration under Section 564(b)(1) of the Act, 21 U.S.C. section 360bbb-3(b)(1), unless the authorization is terminated or revoked.     Resp Syncytial Virus by PCR NEGATIVE NEGATIVE Final    Comment: (NOTE) Fact Sheet for Patients: EntrepreneurPulse.com.au  Fact Sheet for Healthcare Providers: IncredibleEmployment.be  This test is not yet approved or cleared by the [place] FDA and has been authorized for detection and/or diagnosis of SARS-CoV-2 by FDA under an Emergency Use Authorization (EUA). This EUA will remain in effect (meaning this  test can be used) for the duration of the COVID-19 declaration under Section 564(b)(1) of the Act, 21 U.S.C. section 360bbb-3(b)(1), unless the authorization is terminated or revoked.  Performed at [facility], [address] [place], [place] [address]   Culture, blood (Routine x 2)      Status: None   Collection Time: 12/02/22  6:44 AM   Specimen: BLOOD LEFT HAND  Result Value Ref Range Status   Specimen Description BLOOD LEFT HAND  Final   Special Requests   Final    BOTTLES DRAWN AEROBIC AND ANAEROBIC Blood Culture adequate volume   Culture   Final    NO GROWTH 5 DAYS Performed at [facility], [address] [place], [place] [address]    Report Status 12/07/2022 FINAL  Final  Culture, blood (Routine x 2)     Status: None   Collection Time: 12/02/22  6:45 AM   Specimen: BLOOD RIGHT HAND  Result Value Ref Range Status   Specimen Description BLOOD RIGHT HAND  Final   Special Requests   Final    BOTTLES DRAWN AEROBIC ONLY Blood Culture adequate volume   Culture   Final    NO GROWTH 5 DAYS Performed at [facility], [address] [place], [place] [address]    Report Status 12/07/2022 FINAL  Final    Labs: CBC: Recent Labs  Lab 12/02/22 0631 12/03/22 0448 12/04/22 0308 12/05/22 0419  WBC 8.0 5.8 7.0 6.9  NEUTROABS 3.4  --   --   --   HGB 9.4* 9.7* 9.7* 9.5*  HCT 32.5* 32.4* 31.1* 30.7*  MCV 96.2 94.2 89.6 90.0  PLT 260 276 302 817   Basic Metabolic Panel: Recent Labs  Lab 12/02/22 0631 12/03/22 0448 12/04/22 0308 12/05/22 0419 12/06/22 0436  NA 141 142 134* 134* 137  K 4.6 4.8 4.7 4.4 4.0  CL 110 110 104 102 100  CO2 22 22 26  21* 27  GLUCOSE 86 136* 118* 108* 114*  BUN 41* 33* 30* 36* 39*  CREATININE 1.70* 1.38* 1.32* 1.44* 1.51*  CALCIUM 8.2* 8.3* 8.0* 8.0* 8.5*   Liver Function Tests: Recent Labs  Lab 12/02/22 0631  AST 49*  ALT 20  ALKPHOS 75  BILITOT 0.8  PROT 5.9*  ALBUMIN 2.9*    Discharge time spent: greater than 30 minutes.  Signed: Fritzi Mandes, MD [facility] 12/07/2022

## 2022-12-07 NOTE — Progress Notes (Signed)
Pt daughter called, previously notified of pt d/c via MD. Daughter states she is making arrangements to be able to come pick pt up.

## 2022-12-07 NOTE — Evaluation (Signed)
Physical Therapy Evaluation Patient Details Name: Kathy Villanueva MRN: [id number] DOB: [birth year] Today's Date: 12/07/2022  History of Present Illness  88 y.o. feamle with medical history significant of hypertension, hyperlipidemia, systolic CHF with EF 53%, gout, GERD, anemia, ureteral stone, hydronephrosis, SBO, who presents with shortness breath.  Clinical Impression  Pt pleasant but somewhat difficult to get a lot of PLOF information though it appears that she has not walked in > 1 year and that family/aide assist with all transfers and mobility and that she spends most of her time in the w/c.  Pt is weak and limited functionally but was able to give some assist with mobility.  She will certainly need assist with return to home, though she does not appear to be far from her baseline regarding mobility, etc.  O2 in the 90s and HR in the 70s t/o the session.  Will benefit from continued PT her and at home to address functional limitations.       Recommendations for follow up therapy are one component of a multi-disciplinary discharge planning process, led by the attending physician.  Recommendations may be updated based on patient status, additional functional criteria and insurance authorization.  Follow Up Recommendations Home health PT      Assistance Recommended at Discharge Frequent or constant Supervision/Assistance  Patient can return home with the following  A little help with bathing/dressing/bathroom;Two people to help with walking and/or transfers;Assistance with cooking/housework;Help with stairs or ramp for entrance    Equipment Recommendations None recommended by PT  Recommendations for Other Services       Functional Status Assessment Patient has had a recent decline in their functional status and demonstrates the ability to make significant improvements in function in a reasonable and predictable amount of time.     Precautions / Restrictions Precautions Precautions:  Fall Restrictions Weight Bearing Restrictions: No      Mobility  Bed Mobility Overal bed mobility: Needs Assistance Bed Mobility: Supine to Sit     Supine to sit: Min assist     General bed mobility comments: able to move LEs toward EOB, needed assist to elevate torsio    Transfers Overall transfer level: Needs assistance Equipment used: None Transfers: Bed to chair/wheelchair/BSC       Squat pivot transfers: Mod assist     General transfer comment: Pt showed some assist but appears close to her baseline for transfers to/from w/c    Ambulation/Gait               General Gait Details: reports she has not walked in >1 year  Stairs            Wheelchair Mobility    Modified Rankin (Stroke Patients Only)       Balance Overall balance assessment: Needs assistance Sitting-balance support: Bilateral upper extremity supported Sitting balance-Leahy Scale: Good         Standing balance comment: unable to stand at basleine                             Pertinent Vitals/Pain Pain Assessment Pain Assessment: No/denies pain    Home Living Family/patient expects to be discharged to:: Private residence Living Arrangements: Children Available Help at Discharge: Available 24 hours/day;Family;Skilled [place] Access: Stairs to enter (reports family takes her in/out with w/c)   Entrance Stairs-Number of Steps: 1     Home Equipment: Conservation officer, nature (2 wheels);Wheelchair - manual  Prior Function Prior Level of Function : Patient poor historian/Family not available             Mobility Comments: reports she has not walked in over a year, reports she needs assist with all transfers but can help some       Hand Dominance        Extremity/Trunk Assessment   Upper Extremity Assessment Upper Extremity Assessment: Generalized weakness    Lower Extremity Assessment Lower Extremity Assessment: Generalized weakness        Communication   Communication:  (difficult to understand at times)  Cognition Arousal/Alertness: Awake/alert Behavior During Therapy: Flat affect Overall Cognitive Status: Difficult to assess                                          General Comments General comments (skin integrity, edema, etc.): Pt reports feeling close to her normal, just a little weak    Exercises     Assessment/Plan    PT Assessment Patient needs continued PT services  PT Problem List Decreased strength;Decreased activity tolerance;Decreased balance;Decreased safety awareness       PT Treatment Interventions DME instruction;Gait training;Stair training;Functional mobility training;Therapeutic activities;Therapeutic exercise;Balance training;Neuromuscular re-education;Patient/family education    PT Goals (Current goals can be found in the Care Plan section)  Acute Rehab PT Goals Patient Stated Goal: go home PT Goal Formulation: With patient Time For Goal Achievement: 12/20/22 Potential to Achieve Goals: Fair    Frequency Min 2X/week     Co-evaluation               AM-PAC PT "6 Clicks" Mobility  Outcome Measure Help needed turning from your back to your side while in a flat bed without using bedrails?: A Little Help needed moving from lying on your back to sitting on the side of a flat bed without using bedrails?: A Little Help needed moving to and from a bed to a chair (including a wheelchair)?: A Lot Help needed standing up from a chair using your arms (e.g., wheelchair or bedside chair)?: A Lot Help needed to walk in hospital room?: Total Help needed climbing 3-5 steps with a railing? : Total 6 Click Score: 12    End of Session Equipment Utilized During Treatment: Gait belt Activity Tolerance: Patient limited by fatigue;Patient tolerated treatment well Patient left: with chair alarm set;with call bell/phone within reach Nurse Communication: Mobility status PT  Visit Diagnosis: Unsteadiness on feet (R26.81);Muscle weakness (generalized) (M62.81)    Time: 4503-8882 PT Time Calculation (min) (ACUTE ONLY): 25 min   Charges:   PT Evaluation $PT Eval Low Complexity: 1 Low PT Treatments $Therapeutic Activity: 8-22 mins        Kreg Shropshire, DPT 12/07/2022, 10:39 AM

## 2022-12-13 DIAGNOSIS — Z09 Encounter for follow-up examination after completed treatment for conditions other than malignant neoplasm: Secondary | ICD-10-CM | POA: Diagnosis not present

## 2022-12-13 DIAGNOSIS — I2699 Other pulmonary embolism without acute cor pulmonale: Secondary | ICD-10-CM | POA: Diagnosis not present

## 2022-12-17 DIAGNOSIS — I130 Hypertensive heart and chronic kidney disease with heart failure and stage 1 through stage 4 chronic kidney disease, or unspecified chronic kidney disease: Secondary | ICD-10-CM | POA: Diagnosis not present

## 2022-12-17 DIAGNOSIS — J439 Emphysema, unspecified: Secondary | ICD-10-CM | POA: Diagnosis not present

## 2022-12-17 DIAGNOSIS — F03918 Unspecified dementia, unspecified severity, with other behavioral disturbance: Secondary | ICD-10-CM | POA: Diagnosis not present

## 2022-12-17 DIAGNOSIS — E538 Deficiency of other specified B group vitamins: Secondary | ICD-10-CM | POA: Diagnosis not present

## 2022-12-17 DIAGNOSIS — I5020 Unspecified systolic (congestive) heart failure: Secondary | ICD-10-CM | POA: Diagnosis not present

## 2022-12-17 DIAGNOSIS — D631 Anemia in chronic kidney disease: Secondary | ICD-10-CM | POA: Diagnosis not present

## 2022-12-17 DIAGNOSIS — N183 Chronic kidney disease, stage 3 unspecified: Secondary | ICD-10-CM | POA: Diagnosis not present

## 2022-12-17 DIAGNOSIS — F05 Delirium due to known physiological condition: Secondary | ICD-10-CM | POA: Diagnosis not present

## 2022-12-17 DIAGNOSIS — F03911 Unspecified dementia, unspecified severity, with agitation: Secondary | ICD-10-CM | POA: Diagnosis not present

## 2022-12-18 DIAGNOSIS — F03918 Unspecified dementia, unspecified severity, with other behavioral disturbance: Secondary | ICD-10-CM | POA: Diagnosis not present

## 2022-12-18 DIAGNOSIS — J439 Emphysema, unspecified: Secondary | ICD-10-CM | POA: Diagnosis not present

## 2022-12-18 DIAGNOSIS — F05 Delirium due to known physiological condition: Secondary | ICD-10-CM | POA: Diagnosis not present

## 2022-12-18 DIAGNOSIS — N183 Chronic kidney disease, stage 3 unspecified: Secondary | ICD-10-CM | POA: Diagnosis not present

## 2022-12-18 DIAGNOSIS — E538 Deficiency of other specified B group vitamins: Secondary | ICD-10-CM | POA: Diagnosis not present

## 2022-12-18 DIAGNOSIS — I5020 Unspecified systolic (congestive) heart failure: Secondary | ICD-10-CM | POA: Diagnosis not present

## 2022-12-18 DIAGNOSIS — F03911 Unspecified dementia, unspecified severity, with agitation: Secondary | ICD-10-CM | POA: Diagnosis not present

## 2022-12-18 DIAGNOSIS — I130 Hypertensive heart and chronic kidney disease with heart failure and stage 1 through stage 4 chronic kidney disease, or unspecified chronic kidney disease: Secondary | ICD-10-CM | POA: Diagnosis not present

## 2022-12-18 DIAGNOSIS — D631 Anemia in chronic kidney disease: Secondary | ICD-10-CM | POA: Diagnosis not present

## 2022-12-24 DIAGNOSIS — F03911 Unspecified dementia, unspecified severity, with agitation: Secondary | ICD-10-CM | POA: Diagnosis not present

## 2022-12-24 DIAGNOSIS — I5020 Unspecified systolic (congestive) heart failure: Secondary | ICD-10-CM | POA: Diagnosis not present

## 2022-12-24 DIAGNOSIS — N183 Chronic kidney disease, stage 3 unspecified: Secondary | ICD-10-CM | POA: Diagnosis not present

## 2022-12-24 DIAGNOSIS — I130 Hypertensive heart and chronic kidney disease with heart failure and stage 1 through stage 4 chronic kidney disease, or unspecified chronic kidney disease: Secondary | ICD-10-CM | POA: Diagnosis not present

## 2022-12-24 DIAGNOSIS — F03918 Unspecified dementia, unspecified severity, with other behavioral disturbance: Secondary | ICD-10-CM | POA: Diagnosis not present

## 2022-12-24 DIAGNOSIS — E538 Deficiency of other specified B group vitamins: Secondary | ICD-10-CM | POA: Diagnosis not present

## 2022-12-24 DIAGNOSIS — J439 Emphysema, unspecified: Secondary | ICD-10-CM | POA: Diagnosis not present

## 2022-12-24 DIAGNOSIS — F05 Delirium due to known physiological condition: Secondary | ICD-10-CM | POA: Diagnosis not present

## 2022-12-24 DIAGNOSIS — D631 Anemia in chronic kidney disease: Secondary | ICD-10-CM | POA: Diagnosis not present

## 2022-12-27 DIAGNOSIS — F03911 Unspecified dementia, unspecified severity, with agitation: Secondary | ICD-10-CM | POA: Diagnosis not present

## 2022-12-27 DIAGNOSIS — N183 Chronic kidney disease, stage 3 unspecified: Secondary | ICD-10-CM | POA: Diagnosis not present

## 2022-12-27 DIAGNOSIS — F05 Delirium due to known physiological condition: Secondary | ICD-10-CM | POA: Diagnosis not present

## 2022-12-27 DIAGNOSIS — I130 Hypertensive heart and chronic kidney disease with heart failure and stage 1 through stage 4 chronic kidney disease, or unspecified chronic kidney disease: Secondary | ICD-10-CM | POA: Diagnosis not present

## 2022-12-27 DIAGNOSIS — D631 Anemia in chronic kidney disease: Secondary | ICD-10-CM | POA: Diagnosis not present

## 2022-12-27 DIAGNOSIS — F03918 Unspecified dementia, unspecified severity, with other behavioral disturbance: Secondary | ICD-10-CM | POA: Diagnosis not present

## 2022-12-27 DIAGNOSIS — I5020 Unspecified systolic (congestive) heart failure: Secondary | ICD-10-CM | POA: Diagnosis not present

## 2022-12-28 DIAGNOSIS — J439 Emphysema, unspecified: Secondary | ICD-10-CM | POA: Diagnosis not present

## 2022-12-28 DIAGNOSIS — D631 Anemia in chronic kidney disease: Secondary | ICD-10-CM | POA: Diagnosis not present

## 2022-12-28 DIAGNOSIS — N183 Chronic kidney disease, stage 3 unspecified: Secondary | ICD-10-CM | POA: Diagnosis not present

## 2022-12-28 DIAGNOSIS — I5020 Unspecified systolic (congestive) heart failure: Secondary | ICD-10-CM | POA: Diagnosis not present

## 2022-12-28 DIAGNOSIS — I130 Hypertensive heart and chronic kidney disease with heart failure and stage 1 through stage 4 chronic kidney disease, or unspecified chronic kidney disease: Secondary | ICD-10-CM | POA: Diagnosis not present

## 2022-12-28 DIAGNOSIS — E538 Deficiency of other specified B group vitamins: Secondary | ICD-10-CM | POA: Diagnosis not present

## 2022-12-28 DIAGNOSIS — F05 Delirium due to known physiological condition: Secondary | ICD-10-CM | POA: Diagnosis not present

## 2022-12-28 DIAGNOSIS — F03918 Unspecified dementia, unspecified severity, with other behavioral disturbance: Secondary | ICD-10-CM | POA: Diagnosis not present

## 2022-12-28 DIAGNOSIS — F03911 Unspecified dementia, unspecified severity, with agitation: Secondary | ICD-10-CM | POA: Diagnosis not present

## 2022-12-31 DIAGNOSIS — I130 Hypertensive heart and chronic kidney disease with heart failure and stage 1 through stage 4 chronic kidney disease, or unspecified chronic kidney disease: Secondary | ICD-10-CM | POA: Diagnosis not present

## 2022-12-31 DIAGNOSIS — E538 Deficiency of other specified B group vitamins: Secondary | ICD-10-CM | POA: Diagnosis not present

## 2022-12-31 DIAGNOSIS — F03918 Unspecified dementia, unspecified severity, with other behavioral disturbance: Secondary | ICD-10-CM | POA: Diagnosis not present

## 2022-12-31 DIAGNOSIS — I5020 Unspecified systolic (congestive) heart failure: Secondary | ICD-10-CM | POA: Diagnosis not present

## 2022-12-31 DIAGNOSIS — D631 Anemia in chronic kidney disease: Secondary | ICD-10-CM | POA: Diagnosis not present

## 2022-12-31 DIAGNOSIS — F03911 Unspecified dementia, unspecified severity, with agitation: Secondary | ICD-10-CM | POA: Diagnosis not present

## 2022-12-31 DIAGNOSIS — N183 Chronic kidney disease, stage 3 unspecified: Secondary | ICD-10-CM | POA: Diagnosis not present

## 2022-12-31 DIAGNOSIS — F05 Delirium due to known physiological condition: Secondary | ICD-10-CM | POA: Diagnosis not present

## 2022-12-31 DIAGNOSIS — J439 Emphysema, unspecified: Secondary | ICD-10-CM | POA: Diagnosis not present

## 2023-01-02 DIAGNOSIS — F05 Delirium due to known physiological condition: Secondary | ICD-10-CM | POA: Diagnosis not present

## 2023-01-02 DIAGNOSIS — E43 Unspecified severe protein-calorie malnutrition: Secondary | ICD-10-CM | POA: Diagnosis not present

## 2023-01-02 DIAGNOSIS — I5020 Unspecified systolic (congestive) heart failure: Secondary | ICD-10-CM | POA: Diagnosis not present

## 2023-01-02 DIAGNOSIS — F03911 Unspecified dementia, unspecified severity, with agitation: Secondary | ICD-10-CM | POA: Diagnosis not present

## 2023-01-02 DIAGNOSIS — F03918 Unspecified dementia, unspecified severity, with other behavioral disturbance: Secondary | ICD-10-CM | POA: Diagnosis not present

## 2023-01-02 DIAGNOSIS — I130 Hypertensive heart and chronic kidney disease with heart failure and stage 1 through stage 4 chronic kidney disease, or unspecified chronic kidney disease: Secondary | ICD-10-CM | POA: Diagnosis not present

## 2023-01-02 DIAGNOSIS — J439 Emphysema, unspecified: Secondary | ICD-10-CM | POA: Diagnosis not present

## 2023-01-02 DIAGNOSIS — E538 Deficiency of other specified B group vitamins: Secondary | ICD-10-CM | POA: Diagnosis not present

## 2023-01-02 DIAGNOSIS — N183 Chronic kidney disease, stage 3 unspecified: Secondary | ICD-10-CM | POA: Diagnosis not present

## 2023-01-02 DIAGNOSIS — D631 Anemia in chronic kidney disease: Secondary | ICD-10-CM | POA: Diagnosis not present

## 2023-01-07 DIAGNOSIS — E538 Deficiency of other specified B group vitamins: Secondary | ICD-10-CM | POA: Diagnosis not present

## 2023-01-07 DIAGNOSIS — N183 Chronic kidney disease, stage 3 unspecified: Secondary | ICD-10-CM | POA: Diagnosis not present

## 2023-01-07 DIAGNOSIS — I130 Hypertensive heart and chronic kidney disease with heart failure and stage 1 through stage 4 chronic kidney disease, or unspecified chronic kidney disease: Secondary | ICD-10-CM | POA: Diagnosis not present

## 2023-01-07 DIAGNOSIS — F03918 Unspecified dementia, unspecified severity, with other behavioral disturbance: Secondary | ICD-10-CM | POA: Diagnosis not present

## 2023-01-07 DIAGNOSIS — I5020 Unspecified systolic (congestive) heart failure: Secondary | ICD-10-CM | POA: Diagnosis not present

## 2023-01-07 DIAGNOSIS — D631 Anemia in chronic kidney disease: Secondary | ICD-10-CM | POA: Diagnosis not present

## 2023-01-07 DIAGNOSIS — J439 Emphysema, unspecified: Secondary | ICD-10-CM | POA: Diagnosis not present

## 2023-01-07 DIAGNOSIS — F03911 Unspecified dementia, unspecified severity, with agitation: Secondary | ICD-10-CM | POA: Diagnosis not present

## 2023-01-07 DIAGNOSIS — F05 Delirium due to known physiological condition: Secondary | ICD-10-CM | POA: Diagnosis not present

## 2023-01-09 DIAGNOSIS — E538 Deficiency of other specified B group vitamins: Secondary | ICD-10-CM | POA: Diagnosis not present

## 2023-01-09 DIAGNOSIS — D631 Anemia in chronic kidney disease: Secondary | ICD-10-CM | POA: Diagnosis not present

## 2023-01-09 DIAGNOSIS — F03918 Unspecified dementia, unspecified severity, with other behavioral disturbance: Secondary | ICD-10-CM | POA: Diagnosis not present

## 2023-01-09 DIAGNOSIS — I5020 Unspecified systolic (congestive) heart failure: Secondary | ICD-10-CM | POA: Diagnosis not present

## 2023-01-09 DIAGNOSIS — F03911 Unspecified dementia, unspecified severity, with agitation: Secondary | ICD-10-CM | POA: Diagnosis not present

## 2023-01-09 DIAGNOSIS — J439 Emphysema, unspecified: Secondary | ICD-10-CM | POA: Diagnosis not present

## 2023-01-09 DIAGNOSIS — I130 Hypertensive heart and chronic kidney disease with heart failure and stage 1 through stage 4 chronic kidney disease, or unspecified chronic kidney disease: Secondary | ICD-10-CM | POA: Diagnosis not present

## 2023-01-09 DIAGNOSIS — F05 Delirium due to known physiological condition: Secondary | ICD-10-CM | POA: Diagnosis not present

## 2023-01-09 DIAGNOSIS — N183 Chronic kidney disease, stage 3 unspecified: Secondary | ICD-10-CM | POA: Diagnosis not present

## 2023-01-14 IMAGING — DX DG ABD PORTABLE 2V
4 series · 4 of 4 positions shown · non-contrast
Comparison: October 03, 2021.

CLINICAL DATA: Small bowel obstruction.

EXAM:
PORTABLE ABDOMEN - 2 VIEW

[abdomen erect (1 of 2)]
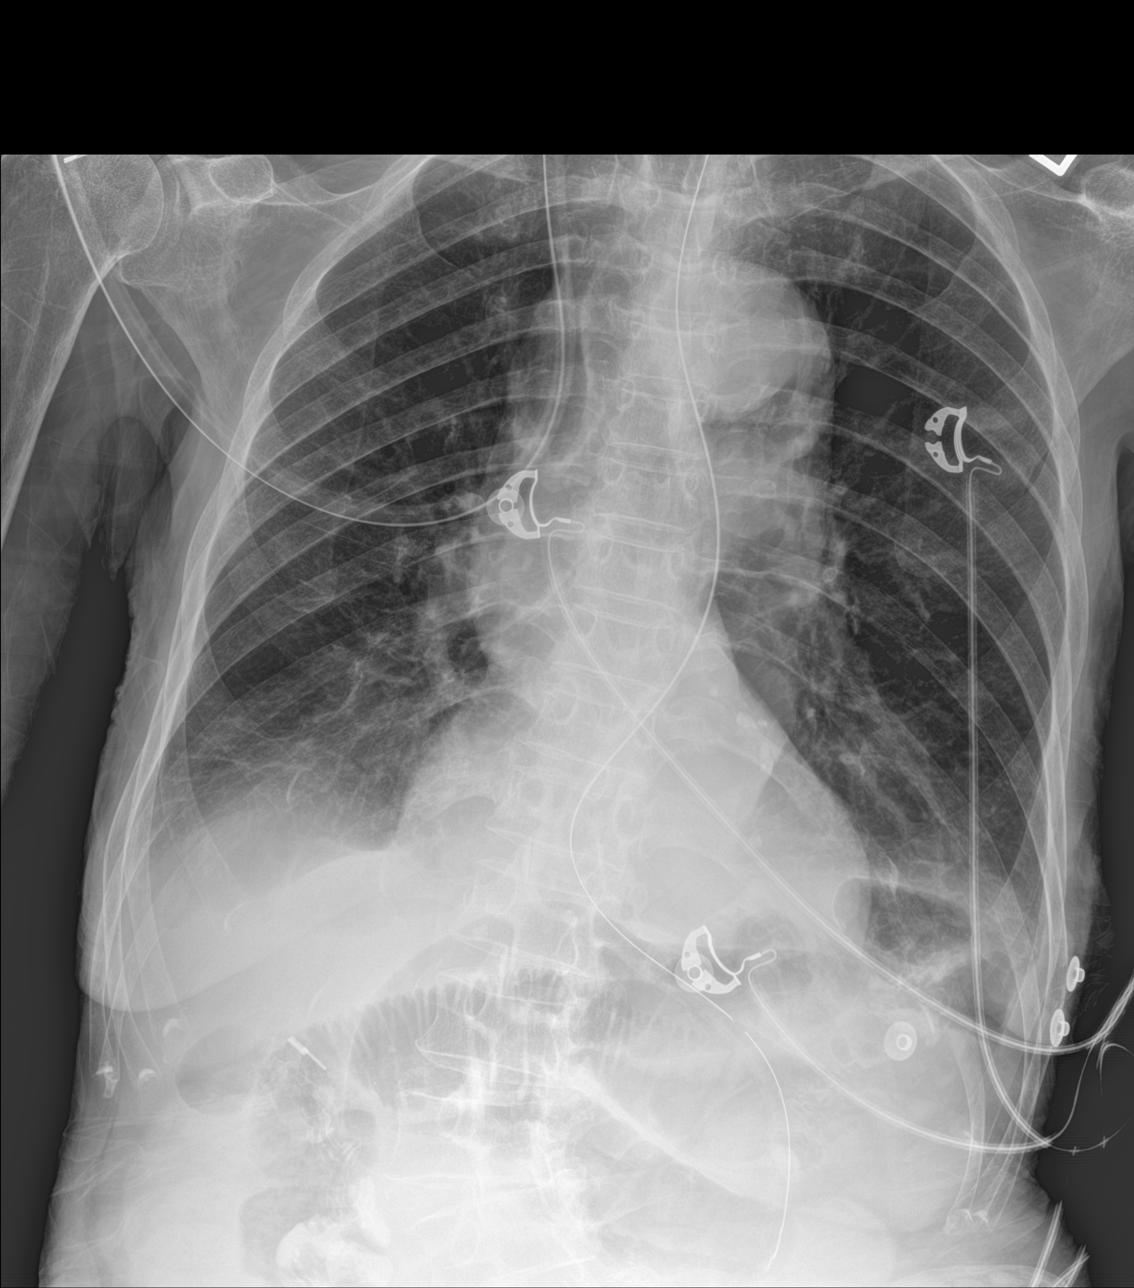

[abdomen erect (2 of 2)]
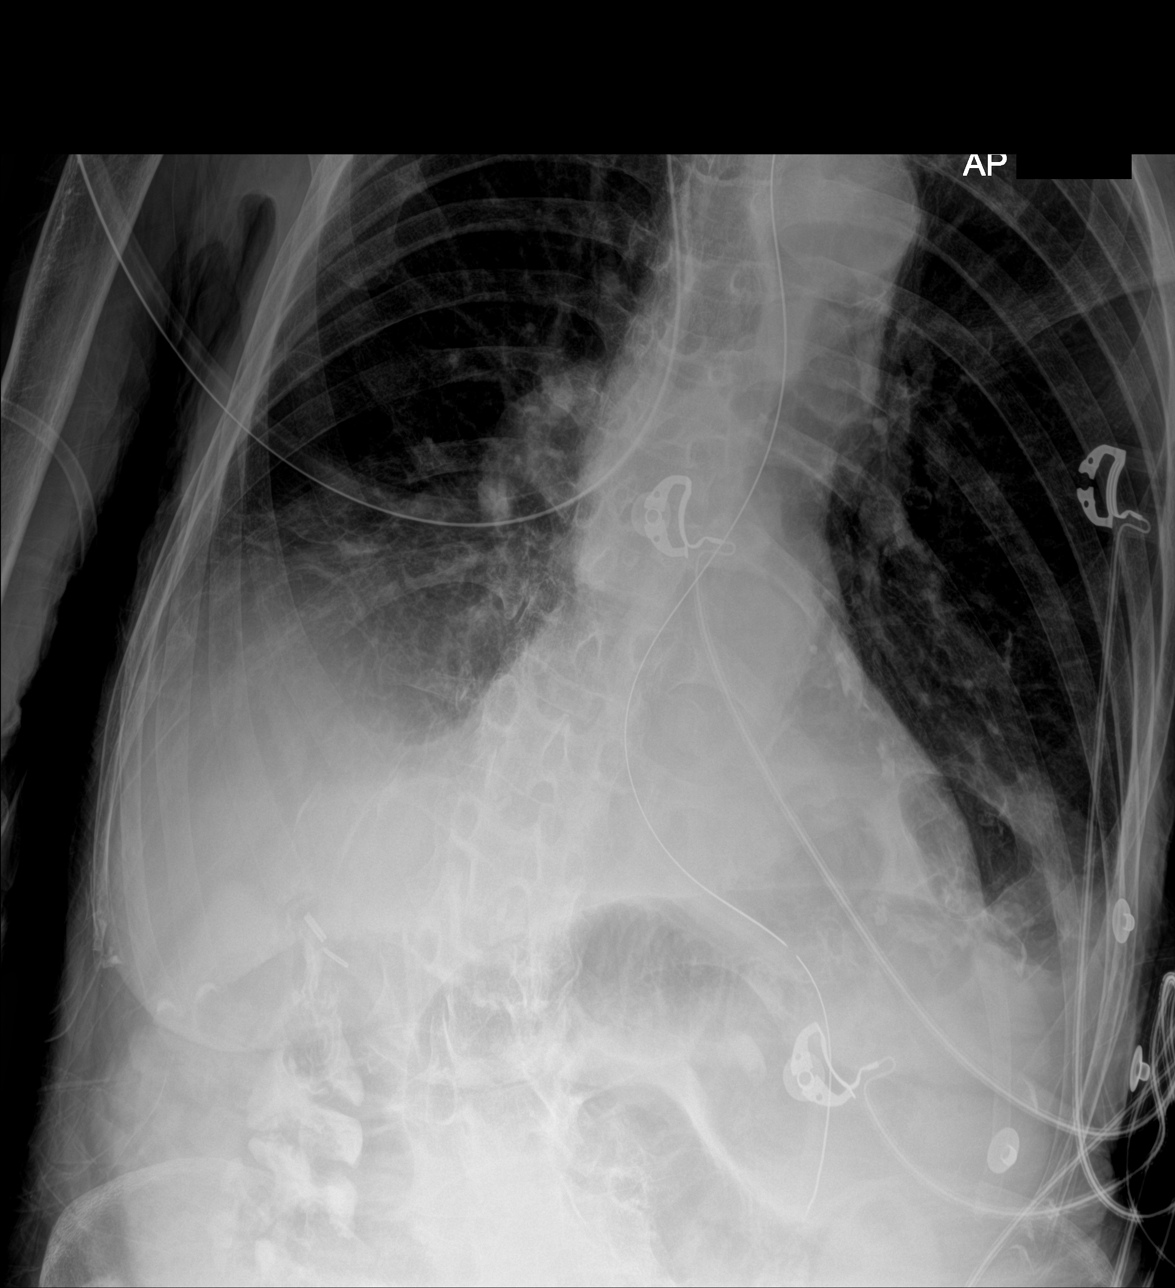

[abdomen supine (1 of 2)]
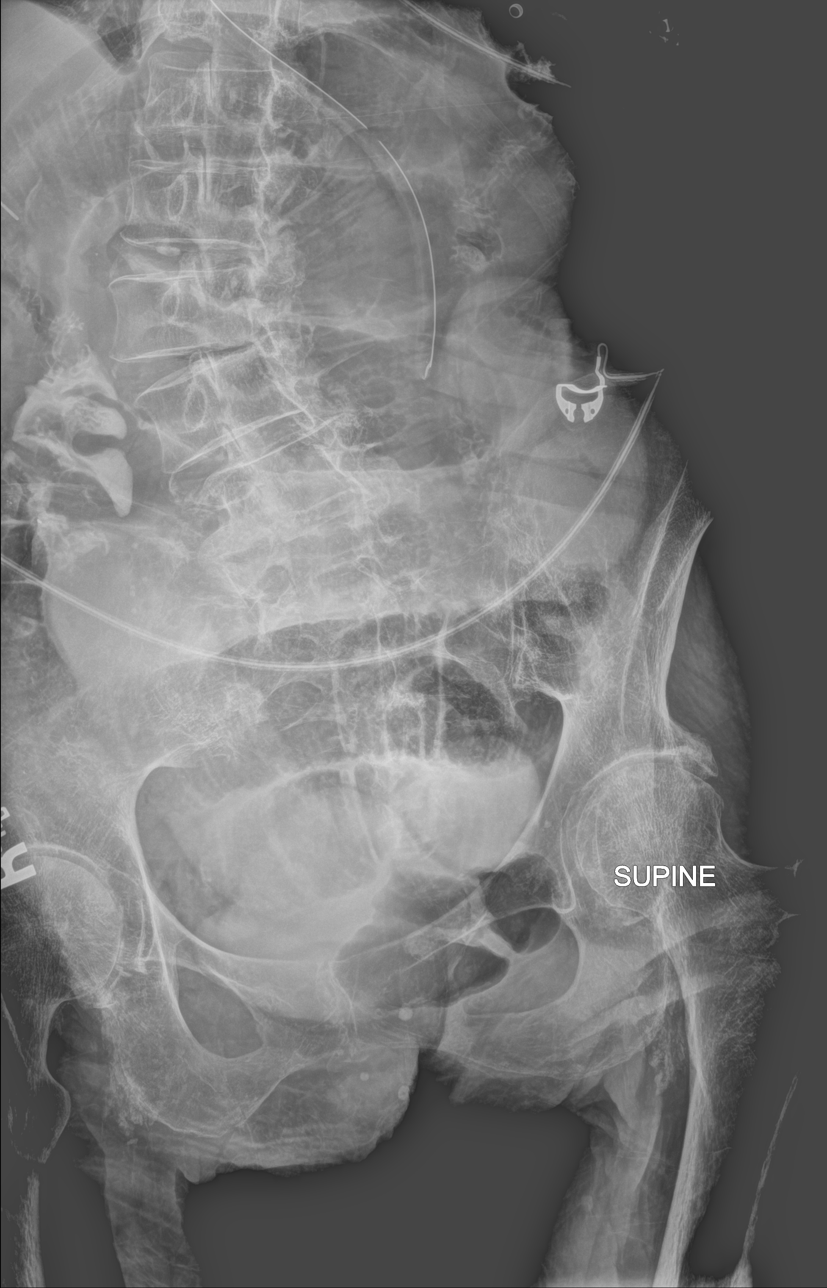

[abdomen supine (2 of 2)]
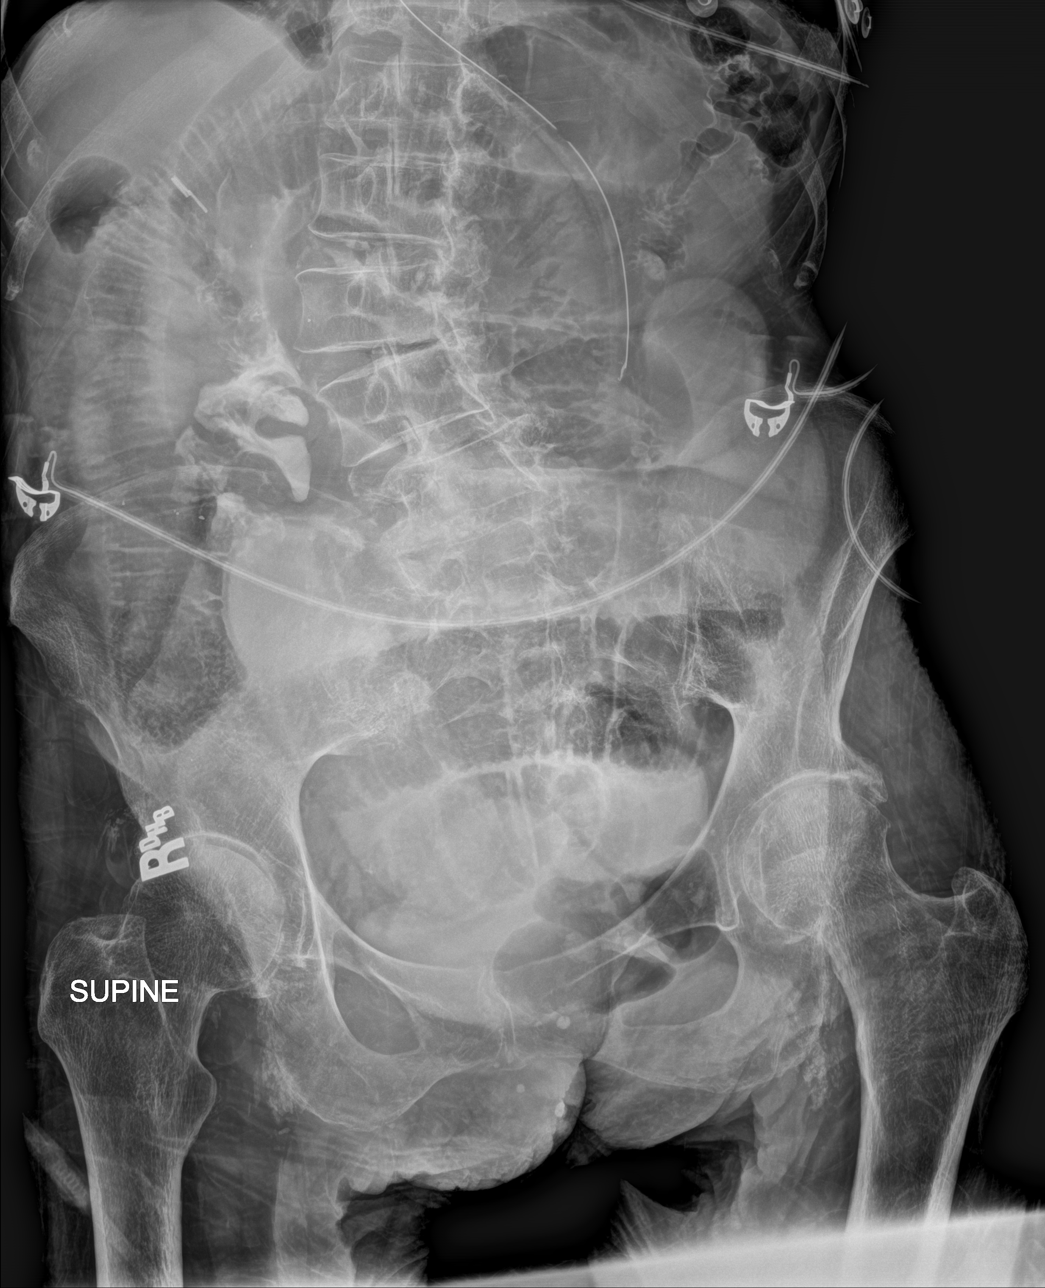

[4 of 4 positions shown; findings below may reference images not displayed]

FINDINGS: Nasogastric tube tip is seen in stomach. Small bowel dilatation is
noted concerning for distal small bowel obstruction. No free air is
noted.
IMPRESSION: Stable small bowel dilatation is noted concerning for distal small
bowel obstruction.

## 2023-01-15 IMAGING — DX DG CHEST 1V PORT
2 series · 2 of 2 positions shown · non-contrast
Comparison: Portable exam 6400 hours compared to 06/27/2014

CLINICAL DATA: PICC line placement

EXAM:
PORTABLE CHEST 1 VIEW

[chest ap (1 of 2)]
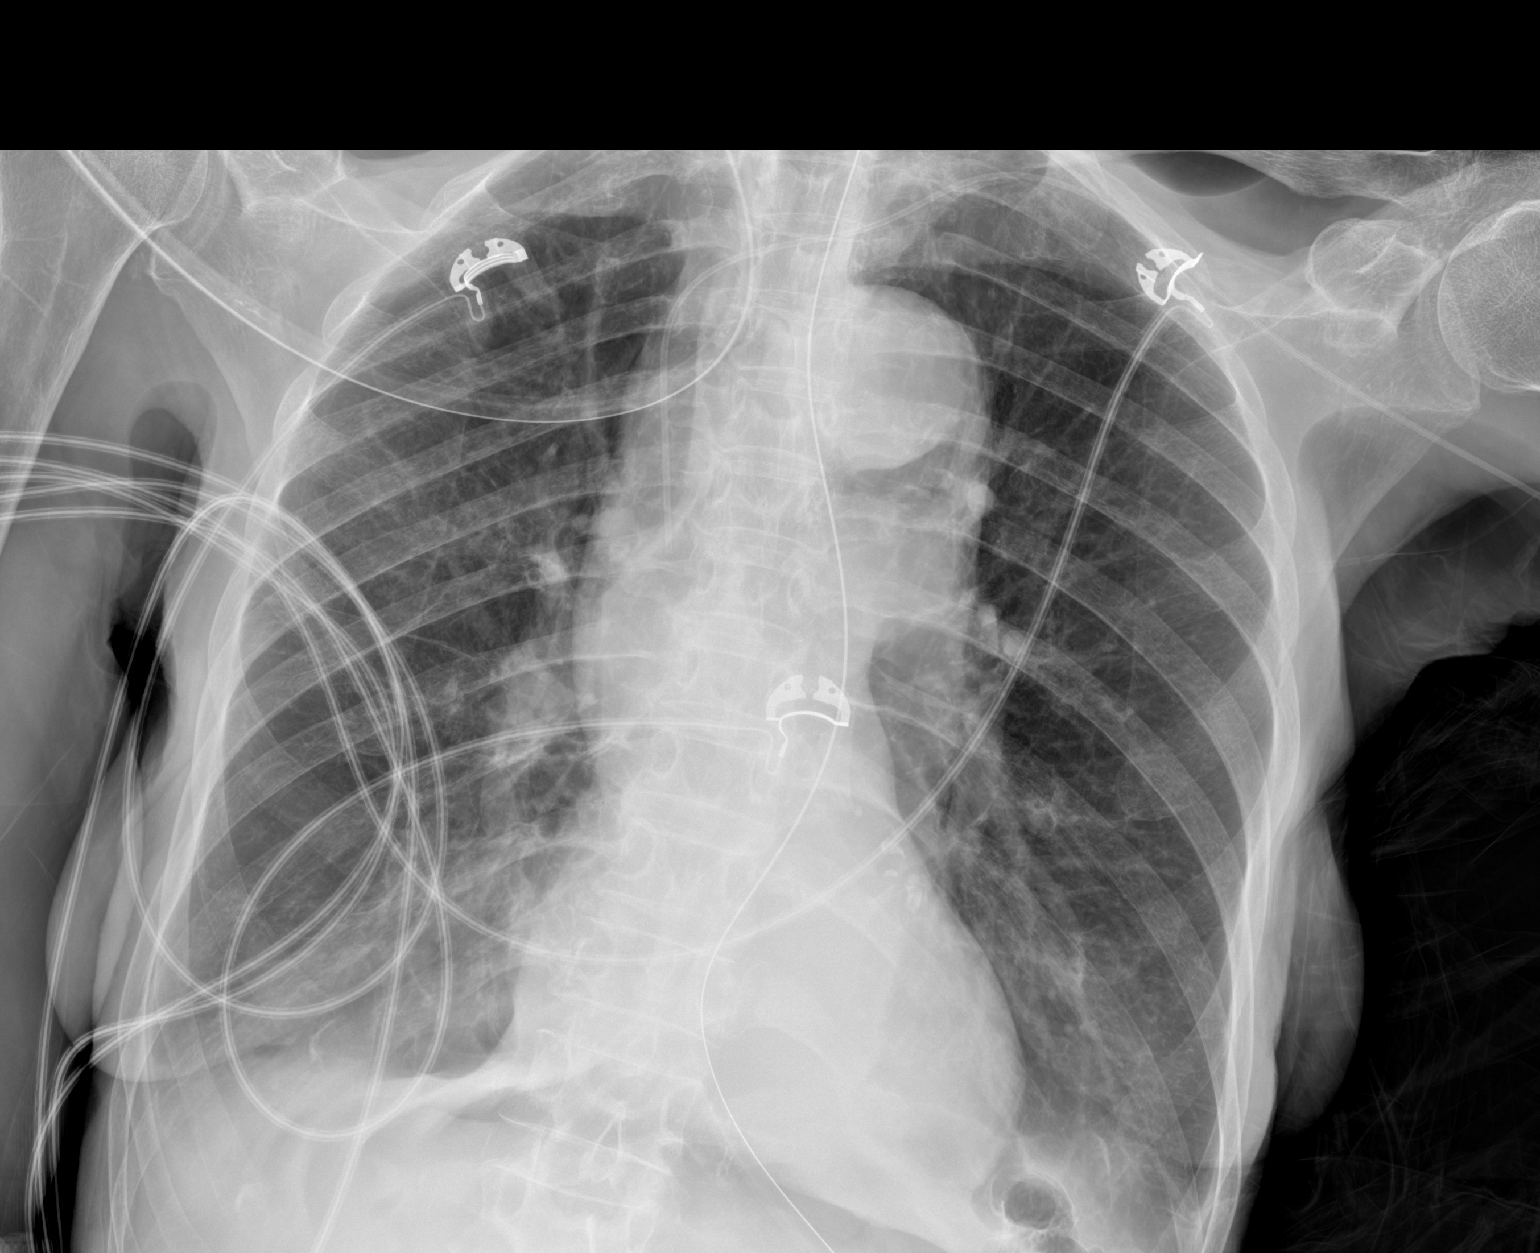

[chest ap (2 of 2)]
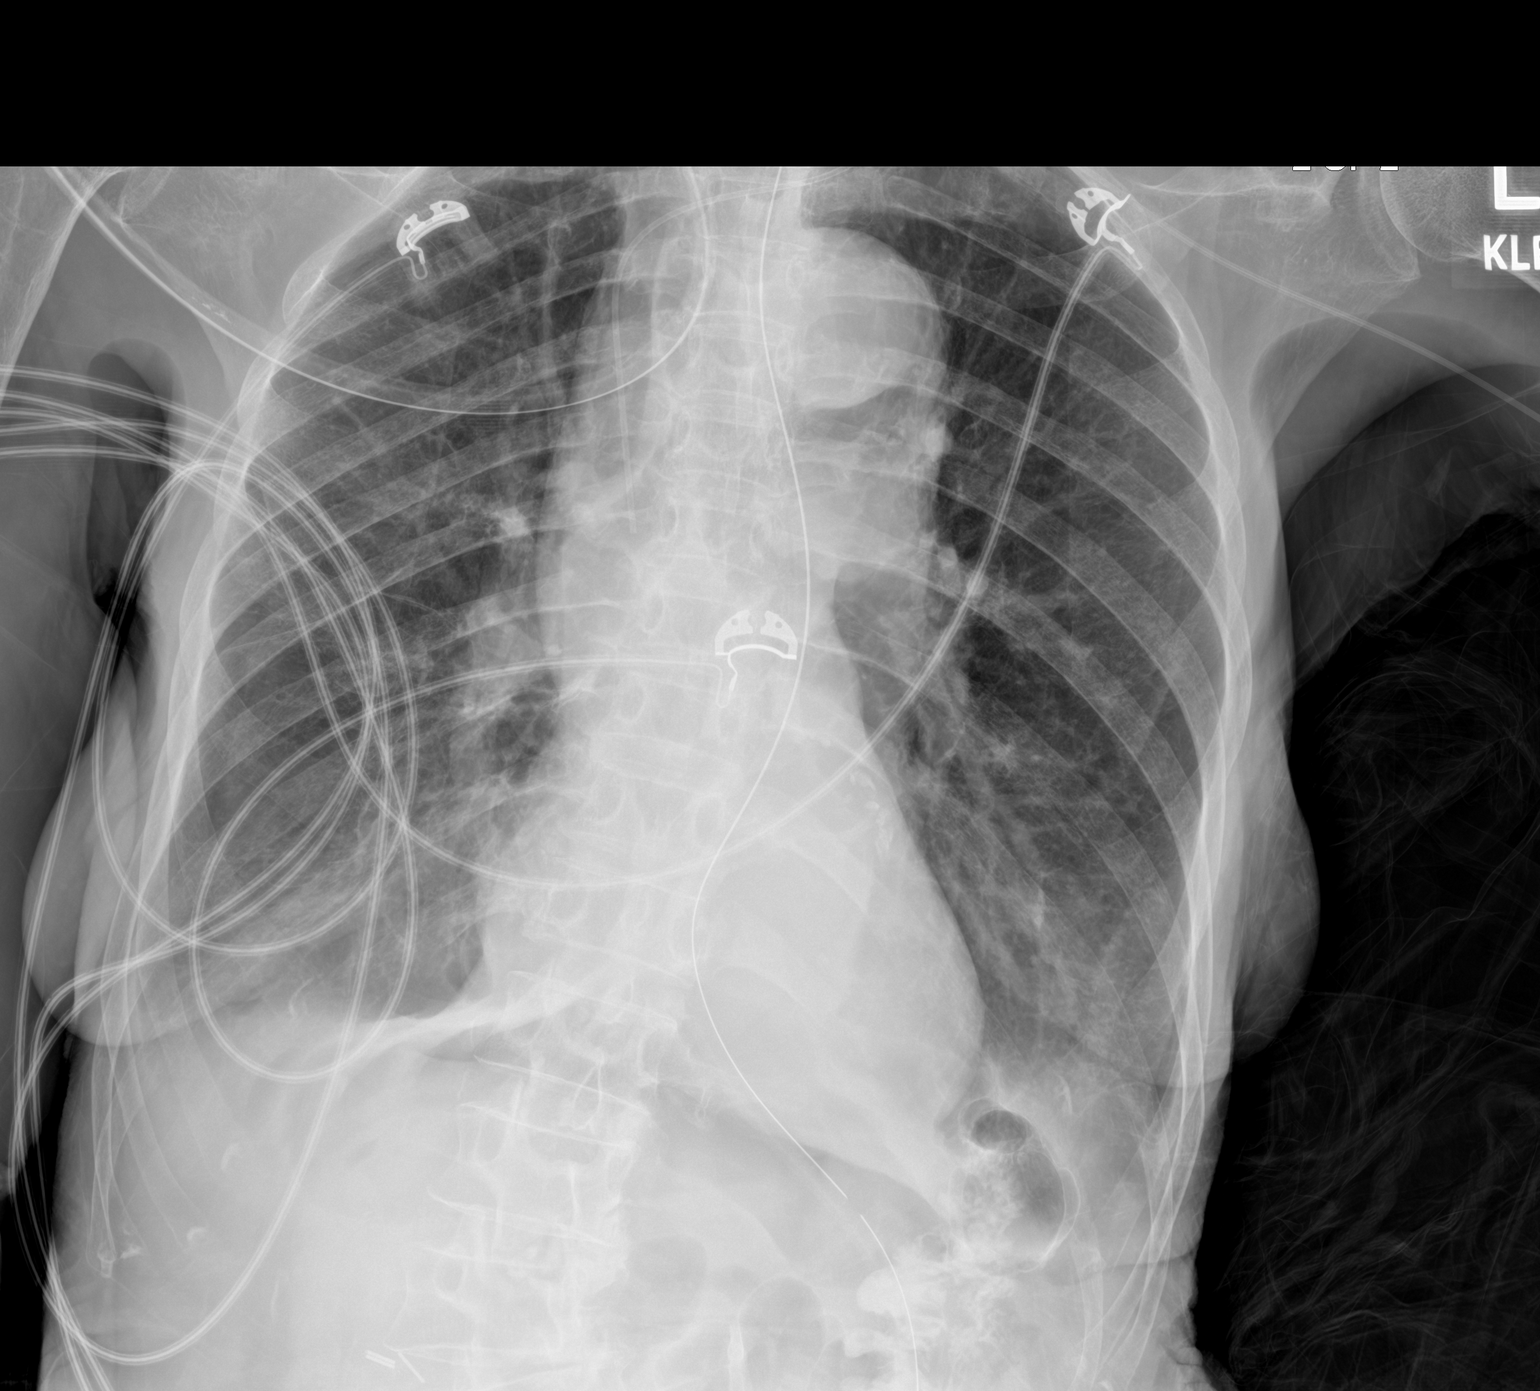

[2 of 2 positions shown; findings below may reference images not displayed]

FINDINGS: Nasogastric tube extends into stomach.

LEFT arm PICC line tip projects over SVC.

Normal heart size, mediastinal contours, and pulmonary vascularity.

Minimal bibasilar atelectasis and questionable tiny pleural
effusions.

Remaining lungs clear.

No pneumothorax or acute osseous findings.
IMPRESSION: Minimal bibasilar atelectasis and questionable tiny pleural
effusions.

## 2023-01-17 DIAGNOSIS — I5020 Unspecified systolic (congestive) heart failure: Secondary | ICD-10-CM | POA: Diagnosis not present

## 2023-01-17 DIAGNOSIS — F03918 Unspecified dementia, unspecified severity, with other behavioral disturbance: Secondary | ICD-10-CM | POA: Diagnosis not present

## 2023-01-17 DIAGNOSIS — N183 Chronic kidney disease, stage 3 unspecified: Secondary | ICD-10-CM | POA: Diagnosis not present

## 2023-01-17 DIAGNOSIS — F05 Delirium due to known physiological condition: Secondary | ICD-10-CM | POA: Diagnosis not present

## 2023-01-17 DIAGNOSIS — F03911 Unspecified dementia, unspecified severity, with agitation: Secondary | ICD-10-CM | POA: Diagnosis not present

## 2023-01-17 DIAGNOSIS — D631 Anemia in chronic kidney disease: Secondary | ICD-10-CM | POA: Diagnosis not present

## 2023-01-17 DIAGNOSIS — I130 Hypertensive heart and chronic kidney disease with heart failure and stage 1 through stage 4 chronic kidney disease, or unspecified chronic kidney disease: Secondary | ICD-10-CM | POA: Diagnosis not present

## 2023-01-17 DIAGNOSIS — E538 Deficiency of other specified B group vitamins: Secondary | ICD-10-CM | POA: Diagnosis not present

## 2023-01-17 DIAGNOSIS — J439 Emphysema, unspecified: Secondary | ICD-10-CM | POA: Diagnosis not present

## 2023-01-22 DIAGNOSIS — D631 Anemia in chronic kidney disease: Secondary | ICD-10-CM | POA: Diagnosis not present

## 2023-01-22 DIAGNOSIS — I130 Hypertensive heart and chronic kidney disease with heart failure and stage 1 through stage 4 chronic kidney disease, or unspecified chronic kidney disease: Secondary | ICD-10-CM | POA: Diagnosis not present

## 2023-01-22 DIAGNOSIS — J439 Emphysema, unspecified: Secondary | ICD-10-CM | POA: Diagnosis not present

## 2023-01-22 DIAGNOSIS — E538 Deficiency of other specified B group vitamins: Secondary | ICD-10-CM | POA: Diagnosis not present

## 2023-01-22 DIAGNOSIS — F03918 Unspecified dementia, unspecified severity, with other behavioral disturbance: Secondary | ICD-10-CM | POA: Diagnosis not present

## 2023-01-22 DIAGNOSIS — F05 Delirium due to known physiological condition: Secondary | ICD-10-CM | POA: Diagnosis not present

## 2023-01-22 DIAGNOSIS — N183 Chronic kidney disease, stage 3 unspecified: Secondary | ICD-10-CM | POA: Diagnosis not present

## 2023-01-22 DIAGNOSIS — I5020 Unspecified systolic (congestive) heart failure: Secondary | ICD-10-CM | POA: Diagnosis not present

## 2023-01-22 DIAGNOSIS — F03911 Unspecified dementia, unspecified severity, with agitation: Secondary | ICD-10-CM | POA: Diagnosis not present

## 2023-01-31 DIAGNOSIS — I5020 Unspecified systolic (congestive) heart failure: Secondary | ICD-10-CM | POA: Diagnosis not present

## 2023-01-31 DIAGNOSIS — E538 Deficiency of other specified B group vitamins: Secondary | ICD-10-CM | POA: Diagnosis not present

## 2023-01-31 DIAGNOSIS — N183 Chronic kidney disease, stage 3 unspecified: Secondary | ICD-10-CM | POA: Diagnosis not present

## 2023-01-31 DIAGNOSIS — D631 Anemia in chronic kidney disease: Secondary | ICD-10-CM | POA: Diagnosis not present

## 2023-01-31 DIAGNOSIS — J439 Emphysema, unspecified: Secondary | ICD-10-CM | POA: Diagnosis not present

## 2023-01-31 DIAGNOSIS — F05 Delirium due to known physiological condition: Secondary | ICD-10-CM | POA: Diagnosis not present

## 2023-01-31 DIAGNOSIS — I130 Hypertensive heart and chronic kidney disease with heart failure and stage 1 through stage 4 chronic kidney disease, or unspecified chronic kidney disease: Secondary | ICD-10-CM | POA: Diagnosis not present

## 2023-01-31 DIAGNOSIS — F03911 Unspecified dementia, unspecified severity, with agitation: Secondary | ICD-10-CM | POA: Diagnosis not present

## 2023-01-31 DIAGNOSIS — F03918 Unspecified dementia, unspecified severity, with other behavioral disturbance: Secondary | ICD-10-CM | POA: Diagnosis not present

## 2023-02-05 DIAGNOSIS — D631 Anemia in chronic kidney disease: Secondary | ICD-10-CM | POA: Diagnosis not present

## 2023-02-05 DIAGNOSIS — I130 Hypertensive heart and chronic kidney disease with heart failure and stage 1 through stage 4 chronic kidney disease, or unspecified chronic kidney disease: Secondary | ICD-10-CM | POA: Diagnosis not present

## 2023-02-05 DIAGNOSIS — F03918 Unspecified dementia, unspecified severity, with other behavioral disturbance: Secondary | ICD-10-CM | POA: Diagnosis not present

## 2023-02-05 DIAGNOSIS — J439 Emphysema, unspecified: Secondary | ICD-10-CM | POA: Diagnosis not present

## 2023-02-05 DIAGNOSIS — N183 Chronic kidney disease, stage 3 unspecified: Secondary | ICD-10-CM | POA: Diagnosis not present

## 2023-02-05 DIAGNOSIS — F05 Delirium due to known physiological condition: Secondary | ICD-10-CM | POA: Diagnosis not present

## 2023-02-05 DIAGNOSIS — E538 Deficiency of other specified B group vitamins: Secondary | ICD-10-CM | POA: Diagnosis not present

## 2023-02-05 DIAGNOSIS — F03911 Unspecified dementia, unspecified severity, with agitation: Secondary | ICD-10-CM | POA: Diagnosis not present

## 2023-02-05 DIAGNOSIS — I5020 Unspecified systolic (congestive) heart failure: Secondary | ICD-10-CM | POA: Diagnosis not present

## 2023-02-19 DIAGNOSIS — L03114 Cellulitis of left upper limb: Secondary | ICD-10-CM | POA: Diagnosis not present

## 2023-03-18 DIAGNOSIS — N1832 Chronic kidney disease, stage 3b: Secondary | ICD-10-CM | POA: Diagnosis not present

## 2023-03-18 DIAGNOSIS — E538 Deficiency of other specified B group vitamins: Secondary | ICD-10-CM | POA: Diagnosis not present

## 2023-03-18 DIAGNOSIS — Z0000 Encounter for general adult medical examination without abnormal findings: Secondary | ICD-10-CM | POA: Diagnosis not present

## 2023-03-18 DIAGNOSIS — I129 Hypertensive chronic kidney disease with stage 1 through stage 4 chronic kidney disease, or unspecified chronic kidney disease: Secondary | ICD-10-CM | POA: Diagnosis not present

## 2023-03-18 DIAGNOSIS — E43 Unspecified severe protein-calorie malnutrition: Secondary | ICD-10-CM | POA: Diagnosis not present

## 2023-03-18 DIAGNOSIS — I2782 Chronic pulmonary embolism: Secondary | ICD-10-CM | POA: Diagnosis not present

## 2023-03-18 DIAGNOSIS — I10 Essential (primary) hypertension: Secondary | ICD-10-CM | POA: Diagnosis not present

## 2023-04-08 ENCOUNTER — Ambulatory Visit
Admission: RE | Admit: 2023-04-08 | Discharge: 2023-04-08 | Disposition: A | Discharge status: Home / Self Care | Payer: Medicare HMO | Source: Ambulatory Visit | Attending: Physician Assistant | Admitting: Physician Assistant

## 2023-04-08 ENCOUNTER — Other Ambulatory Visit: Payer: Self-pay | Admitting: Physician Assistant

## 2023-04-08 DIAGNOSIS — M25442 Effusion, left hand: Secondary | ICD-10-CM

## 2023-04-08 DIAGNOSIS — M25472 Effusion, left ankle: Secondary | ICD-10-CM | POA: Diagnosis not present

## 2023-04-08 DIAGNOSIS — M25532 Pain in left wrist: Secondary | ICD-10-CM | POA: Diagnosis not present

## 2023-04-08 DIAGNOSIS — M7989 Other specified soft tissue disorders: Secondary | ICD-10-CM | POA: Diagnosis not present

## 2023-09-08 DIAGNOSIS — N183 Chronic kidney disease, stage 3 unspecified: Secondary | ICD-10-CM | POA: Diagnosis not present

## 2023-09-08 DIAGNOSIS — Z9181 History of falling: Secondary | ICD-10-CM | POA: Diagnosis not present

## 2023-09-08 DIAGNOSIS — Z993 Dependence on wheelchair: Secondary | ICD-10-CM | POA: Diagnosis not present

## 2023-09-08 DIAGNOSIS — R159 Full incontinence of feces: Secondary | ICD-10-CM | POA: Diagnosis not present

## 2023-09-08 DIAGNOSIS — R3981 Functional urinary incontinence: Secondary | ICD-10-CM | POA: Diagnosis not present

## 2023-09-08 DIAGNOSIS — F0390 Unspecified dementia without behavioral disturbance: Secondary | ICD-10-CM | POA: Diagnosis not present

## 2023-09-17 DIAGNOSIS — E43 Unspecified severe protein-calorie malnutrition: Secondary | ICD-10-CM | POA: Diagnosis not present

## 2023-09-17 DIAGNOSIS — I129 Hypertensive chronic kidney disease with stage 1 through stage 4 chronic kidney disease, or unspecified chronic kidney disease: Secondary | ICD-10-CM | POA: Diagnosis not present

## 2023-09-17 DIAGNOSIS — I2782 Chronic pulmonary embolism: Secondary | ICD-10-CM | POA: Diagnosis not present

## 2023-09-17 DIAGNOSIS — N1832 Chronic kidney disease, stage 3b: Secondary | ICD-10-CM | POA: Diagnosis not present

## 2023-10-08 DIAGNOSIS — N183 Chronic kidney disease, stage 3 unspecified: Secondary | ICD-10-CM | POA: Diagnosis not present

## 2023-10-08 DIAGNOSIS — Z993 Dependence on wheelchair: Secondary | ICD-10-CM | POA: Diagnosis not present

## 2023-10-08 DIAGNOSIS — R159 Full incontinence of feces: Secondary | ICD-10-CM | POA: Diagnosis not present

## 2023-10-08 DIAGNOSIS — Z9181 History of falling: Secondary | ICD-10-CM | POA: Diagnosis not present

## 2023-10-08 DIAGNOSIS — F0390 Unspecified dementia without behavioral disturbance: Secondary | ICD-10-CM | POA: Diagnosis not present

## 2023-10-08 DIAGNOSIS — R3981 Functional urinary incontinence: Secondary | ICD-10-CM | POA: Diagnosis not present

## 2023-11-07 DIAGNOSIS — Z993 Dependence on wheelchair: Secondary | ICD-10-CM | POA: Diagnosis not present

## 2023-11-07 DIAGNOSIS — F0390 Unspecified dementia without behavioral disturbance: Secondary | ICD-10-CM | POA: Diagnosis not present

## 2023-11-07 DIAGNOSIS — R3981 Functional urinary incontinence: Secondary | ICD-10-CM | POA: Diagnosis not present

## 2023-11-07 DIAGNOSIS — N183 Chronic kidney disease, stage 3 unspecified: Secondary | ICD-10-CM | POA: Diagnosis not present

## 2023-11-07 DIAGNOSIS — Z9181 History of falling: Secondary | ICD-10-CM | POA: Diagnosis not present

## 2023-11-07 DIAGNOSIS — R159 Full incontinence of feces: Secondary | ICD-10-CM | POA: Diagnosis not present

## 2023-12-07 DIAGNOSIS — F0390 Unspecified dementia without behavioral disturbance: Secondary | ICD-10-CM | POA: Diagnosis not present

## 2023-12-07 DIAGNOSIS — R3981 Functional urinary incontinence: Secondary | ICD-10-CM | POA: Diagnosis not present

## 2023-12-07 DIAGNOSIS — Z993 Dependence on wheelchair: Secondary | ICD-10-CM | POA: Diagnosis not present

## 2023-12-07 DIAGNOSIS — R159 Full incontinence of feces: Secondary | ICD-10-CM | POA: Diagnosis not present

## 2023-12-07 DIAGNOSIS — N183 Chronic kidney disease, stage 3 unspecified: Secondary | ICD-10-CM | POA: Diagnosis not present

## 2023-12-07 DIAGNOSIS — Z9181 History of falling: Secondary | ICD-10-CM | POA: Diagnosis not present

## 2024-01-06 DIAGNOSIS — F0390 Unspecified dementia without behavioral disturbance: Secondary | ICD-10-CM | POA: Diagnosis not present

## 2024-01-06 DIAGNOSIS — R3981 Functional urinary incontinence: Secondary | ICD-10-CM | POA: Diagnosis not present

## 2024-01-06 DIAGNOSIS — Z993 Dependence on wheelchair: Secondary | ICD-10-CM | POA: Diagnosis not present

## 2024-01-06 DIAGNOSIS — N183 Chronic kidney disease, stage 3 unspecified: Secondary | ICD-10-CM | POA: Diagnosis not present

## 2024-01-06 DIAGNOSIS — R159 Full incontinence of feces: Secondary | ICD-10-CM | POA: Diagnosis not present

## 2024-01-06 DIAGNOSIS — Z9181 History of falling: Secondary | ICD-10-CM | POA: Diagnosis not present

## 2024-01-26 DIAGNOSIS — E43 Unspecified severe protein-calorie malnutrition: Secondary | ICD-10-CM | POA: Diagnosis not present

## 2024-01-26 DIAGNOSIS — Z0000 Encounter for general adult medical examination without abnormal findings: Secondary | ICD-10-CM | POA: Diagnosis not present

## 2024-01-26 DIAGNOSIS — I129 Hypertensive chronic kidney disease with stage 1 through stage 4 chronic kidney disease, or unspecified chronic kidney disease: Secondary | ICD-10-CM | POA: Diagnosis not present

## 2024-01-26 DIAGNOSIS — F0390 Unspecified dementia without behavioral disturbance: Secondary | ICD-10-CM | POA: Diagnosis not present

## 2024-01-26 DIAGNOSIS — N1832 Chronic kidney disease, stage 3b: Secondary | ICD-10-CM | POA: Diagnosis not present

## 2024-01-26 DIAGNOSIS — I2782 Chronic pulmonary embolism: Secondary | ICD-10-CM | POA: Diagnosis not present

## 2024-01-31 ENCOUNTER — Emergency Department
Admission: EM | Admit: 2024-01-31 | Discharge: 2024-01-31 | Disposition: A | Discharge status: Home / Self Care | Payer: Medicare HMO | Attending: Emergency Medicine | Admitting: Emergency Medicine

## 2024-01-31 ENCOUNTER — Emergency Department: Payer: Medicare HMO

## 2024-01-31 ENCOUNTER — Other Ambulatory Visit: Payer: Self-pay

## 2024-01-31 ENCOUNTER — Encounter: Payer: Self-pay | Admitting: Emergency Medicine

## 2024-01-31 DIAGNOSIS — M25561 Pain in right knee: Secondary | ICD-10-CM | POA: Insufficient documentation

## 2024-01-31 DIAGNOSIS — W19XXXA Unspecified fall, initial encounter: Secondary | ICD-10-CM

## 2024-01-31 DIAGNOSIS — M5030 Other cervical disc degeneration, unspecified cervical region: Secondary | ICD-10-CM | POA: Diagnosis not present

## 2024-01-31 DIAGNOSIS — Z043 Encounter for examination and observation following other accident: Secondary | ICD-10-CM | POA: Diagnosis not present

## 2024-01-31 DIAGNOSIS — I6522 Occlusion and stenosis of left carotid artery: Secondary | ICD-10-CM | POA: Diagnosis not present

## 2024-01-31 DIAGNOSIS — R519 Headache, unspecified: Secondary | ICD-10-CM | POA: Diagnosis not present

## 2024-01-31 DIAGNOSIS — R0902 Hypoxemia: Secondary | ICD-10-CM | POA: Diagnosis not present

## 2024-01-31 DIAGNOSIS — W06XXXA Fall from bed, initial encounter: Secondary | ICD-10-CM | POA: Insufficient documentation

## 2024-01-31 DIAGNOSIS — S0990XA Unspecified injury of head, initial encounter: Secondary | ICD-10-CM | POA: Insufficient documentation

## 2024-01-31 DIAGNOSIS — R531 Weakness: Secondary | ICD-10-CM | POA: Diagnosis not present

## 2024-01-31 LAB — CBG MONITORING, ED: Glucose-Capillary: 96 mg/dL (ref 70–99)

## 2024-01-31 NOTE — Discharge Instructions (Signed)
 Fortunately the evaluation the emergency department did not show any emergency conditions like broken bones in the head or neck or bleeding inside the brain resulting from the fall.  Head injuries may cause headache, sleepiness, difficulty concentrating.  It is important that she get plenty of rest to heal.  Thank you for choosing [place] for your health care today!  Please see your primary doctor this week for a follow up appointment.   If you have any new, worsening, or unexpected symptoms call your doctor right away or come back to the emergency department for reevaluation.  It was my pleasure to care for you today.   Daneil Dan [place] Charon, MD

## 2024-01-31 NOTE — ED Triage Notes (Signed)
 Patient fell while getting out of bed.  She denies head trauma or pain.  She endorses Right knee pain.  Patient lives alone.

## 2024-01-31 NOTE — ED Provider Notes (Signed)
 [facility] Provider Note    None    (approximate)   History   Fall (Patient had unwitnessed fall getting out of bed.  She states she lives alone.  C/O R knee pain.  Denies head strike or pain.)   HPI  Kathy Villanueva is a 89 y.o. female   Past medical history of multiple medical comorbidities who presents with a fall out of bed with head strike.  She has been in her regular state of health gets help at home with a caretaker during the day but usually sleeps alone at night.  She rolled out of her bed.  She has no recent medical illnesses or acute medical complaints other than headache after striking her head after she fell.  She denies injuries or pain elsewhere.  Independent Historian contributed to assessment above: Son and daughter were here at bedside to corroborate information past medical history as above    Physical Exam   Triage Vital Signs: ED Triage Vitals  Encounter Vitals Group     BP 01/31/24 0307 (!) 148/97     Systolic BP Percentile --      Diastolic BP Percentile --      Pulse Rate 01/31/24 0307 68     Resp 01/31/24 0307 20     Temp --      Temp Source 01/31/24 0307 Oral     SpO2 01/31/24 0307 96 %     Weight 01/31/24 0308 92 lb 13 oz (42.1 kg)     Height 01/31/24 0308 5\' 4"  (1.626 m)     Head Circumference --      Peak Flow --      Pain Score 01/31/24 0308 4     Pain Loc --      Pain Education --      Exclude from Growth Chart --     Most recent vital signs: Vitals:   01/31/24 0430 01/31/24 0500  BP: (!) 144/106 127/87  Pulse:    Resp: 20 (!) 24  SpO2: (!) 78% 98%    General: Awake, no distress.  CV:  Good peripheral perfusion.  Resp:  Normal effort.  Abd:  No distention.  Other:  Elderly frail-appearing patient lying in bed with no acute distress.  Pleasant, appropriate, answering all questions appropriately.  Neck is supple with full range of motion no C-spine tenderness or deformities.  No obvious signs of external  head injury.  Able to gingerly range all extremities at all joints.  She has some bruising to the lateral side of bilateral knees but no bony tenderness and she has full active range of motion and neurovascular intact.  She is able to range at the hip bilaterally.  She has a soft nontender abdomen.  Her chest wall shows no obvious signs of injury and there is no crepitus or tenderness to palpation of the bony areas of the chest wall or T or L-spine.   ED Results / Procedures / Treatments   Labs (all labs ordered are listed, but only abnormal results are displayed) Labs Reviewed  CBG MONITORING, ED      RADIOLOGY I independently reviewed and interpreted CT of the head and I see no obvious bleeding or midline shift I also reviewed radiologist's formal read.   PROCEDURES:  Critical Care performed: No  Procedures   MEDICATIONS ORDERED IN ED: Medications - No data to display   IMPRESSION / MDM / ASSESSMENT AND PLAN / ED COURSE  I reviewed  the triage vital signs and the nursing notes.                                Patient's presentation is most consistent with acute presentation with potential threat to life or bodily function.  Differential diagnosis includes, but is not limited to, blunt traumatic head injury including skull fracture ICH C-spine fracture dislocation   The patient is on the cardiac monitor to evaluate for evidence of arrhythmia and/or significant heart rate changes.  MDM:    Mechanical roll out of bed with head injury in this anticoagulated elderly patient.  CT of the head and neck showed no emergency traumatic findings.  Head to toe examination shows no other signs of injury and will turn for further imaging.  Given no other acute medical complaints, defer further medical workup at this time.  Patient's family is in touch with social worker and has PMD for this patient for which they will work with to get extra help at home or facilitate placement in a care  facility.  She had an episode of excessive sleepiness after she fell asleep, vital signs were normal, no apnea, pulses intact, and was awoken with painful stimulus.  Blood glucose was normal.  She subsequently got her CT scans which were normal.  Vital signs stable upon discharge.      FINAL CLINICAL IMPRESSION(S) / ED DIAGNOSES   Final diagnoses:  Fall, initial encounter  Injury of head, initial encounter     Rx / DC Orders   ED Discharge Orders     None        Note:  This document was prepared using Dragon voice recognition software and may include unintentional dictation errors.    Pilar Jarvis, MD 01/31/24 [phone]

## 2024-01-31 NOTE — ED Notes (Signed)
 Family was concerned because patient was difficult to arouse with verbal stimulation.  Patient responded to painful stimulation and movement from her stretcher to the CT table.  No acute distress noted.  VS WNL.

## 2024-02-05 DIAGNOSIS — R159 Full incontinence of feces: Secondary | ICD-10-CM | POA: Diagnosis not present

## 2024-02-05 DIAGNOSIS — Z993 Dependence on wheelchair: Secondary | ICD-10-CM | POA: Diagnosis not present

## 2024-02-05 DIAGNOSIS — N183 Chronic kidney disease, stage 3 unspecified: Secondary | ICD-10-CM | POA: Diagnosis not present

## 2024-02-05 DIAGNOSIS — F0390 Unspecified dementia without behavioral disturbance: Secondary | ICD-10-CM | POA: Diagnosis not present

## 2024-02-05 DIAGNOSIS — Z9181 History of falling: Secondary | ICD-10-CM | POA: Diagnosis not present

## 2024-02-05 DIAGNOSIS — R3981 Functional urinary incontinence: Secondary | ICD-10-CM | POA: Diagnosis not present

## 2024-03-06 DIAGNOSIS — R3981 Functional urinary incontinence: Secondary | ICD-10-CM | POA: Diagnosis not present

## 2024-03-06 DIAGNOSIS — R159 Full incontinence of feces: Secondary | ICD-10-CM | POA: Diagnosis not present

## 2024-03-06 DIAGNOSIS — Z993 Dependence on wheelchair: Secondary | ICD-10-CM | POA: Diagnosis not present

## 2024-03-06 DIAGNOSIS — F0390 Unspecified dementia without behavioral disturbance: Secondary | ICD-10-CM | POA: Diagnosis not present

## 2024-03-06 DIAGNOSIS — N183 Chronic kidney disease, stage 3 unspecified: Secondary | ICD-10-CM | POA: Diagnosis not present

## 2024-03-06 DIAGNOSIS — Z9181 History of falling: Secondary | ICD-10-CM | POA: Diagnosis not present

## 2024-04-05 DIAGNOSIS — Z9181 History of falling: Secondary | ICD-10-CM | POA: Diagnosis not present

## 2024-04-05 DIAGNOSIS — F0390 Unspecified dementia without behavioral disturbance: Secondary | ICD-10-CM | POA: Diagnosis not present

## 2024-04-05 DIAGNOSIS — R3981 Functional urinary incontinence: Secondary | ICD-10-CM | POA: Diagnosis not present

## 2024-04-05 DIAGNOSIS — R159 Full incontinence of feces: Secondary | ICD-10-CM | POA: Diagnosis not present

## 2024-04-05 DIAGNOSIS — Z993 Dependence on wheelchair: Secondary | ICD-10-CM | POA: Diagnosis not present

## 2024-04-05 DIAGNOSIS — N183 Chronic kidney disease, stage 3 unspecified: Secondary | ICD-10-CM | POA: Diagnosis not present

## 2024-05-05 DIAGNOSIS — Z9181 History of falling: Secondary | ICD-10-CM | POA: Diagnosis not present

## 2024-05-05 DIAGNOSIS — Z993 Dependence on wheelchair: Secondary | ICD-10-CM | POA: Diagnosis not present

## 2024-05-05 DIAGNOSIS — N183 Chronic kidney disease, stage 3 unspecified: Secondary | ICD-10-CM | POA: Diagnosis not present

## 2024-05-05 DIAGNOSIS — R159 Full incontinence of feces: Secondary | ICD-10-CM | POA: Diagnosis not present

## 2024-05-05 DIAGNOSIS — F0390 Unspecified dementia without behavioral disturbance: Secondary | ICD-10-CM | POA: Diagnosis not present

## 2024-05-05 DIAGNOSIS — R3981 Functional urinary incontinence: Secondary | ICD-10-CM | POA: Diagnosis not present

## 2024-05-06 ENCOUNTER — Other Ambulatory Visit: Payer: Self-pay

## 2024-05-06 ENCOUNTER — Emergency Department
Admission: EM | Admit: 2024-05-06 | Discharge: 2024-05-06 | Disposition: A | Discharge status: Home / Self Care | Attending: Emergency Medicine | Admitting: Emergency Medicine

## 2024-05-06 ENCOUNTER — Emergency Department

## 2024-05-06 DIAGNOSIS — N189 Chronic kidney disease, unspecified: Secondary | ICD-10-CM | POA: Insufficient documentation

## 2024-05-06 DIAGNOSIS — I129 Hypertensive chronic kidney disease with stage 1 through stage 4 chronic kidney disease, or unspecified chronic kidney disease: Secondary | ICD-10-CM | POA: Insufficient documentation

## 2024-05-06 DIAGNOSIS — Z7901 Long term (current) use of anticoagulants: Secondary | ICD-10-CM | POA: Diagnosis not present

## 2024-05-06 DIAGNOSIS — X58XXXA Exposure to other specified factors, initial encounter: Secondary | ICD-10-CM | POA: Insufficient documentation

## 2024-05-06 DIAGNOSIS — S8011XA Contusion of right lower leg, initial encounter: Secondary | ICD-10-CM | POA: Insufficient documentation

## 2024-05-06 DIAGNOSIS — S8991XA Unspecified injury of right lower leg, initial encounter: Secondary | ICD-10-CM | POA: Diagnosis present

## 2024-05-06 DIAGNOSIS — F0390 Unspecified dementia without behavioral disturbance: Secondary | ICD-10-CM | POA: Insufficient documentation

## 2024-05-06 DIAGNOSIS — M79604 Pain in right leg: Secondary | ICD-10-CM | POA: Insufficient documentation

## 2024-05-06 DIAGNOSIS — M799 Soft tissue disorder, unspecified: Secondary | ICD-10-CM | POA: Diagnosis not present

## 2024-05-06 DIAGNOSIS — I10 Essential (primary) hypertension: Secondary | ICD-10-CM | POA: Diagnosis not present

## 2024-05-06 LAB — CBC WITH DIFFERENTIAL/PLATELET
Abs Immature Granulocytes: 0.01 10*3/uL (ref 0.00–0.07)
Basophils Absolute: 0.1 10*3/uL (ref 0.0–0.1)
Basophils Relative: 1 %
Eosinophils Absolute: 0.3 10*3/uL (ref 0.0–0.5)
Eosinophils Relative: 6 %
HCT: 35.5 % — ABNORMAL LOW (ref 36.0–46.0)
Hemoglobin: 10.7 g/dL — ABNORMAL LOW (ref 12.0–15.0)
Immature Granulocytes: 0 %
Lymphocytes Relative: 28 %
Lymphs Abs: 1.5 10*3/uL (ref 0.7–4.0)
MCH: 29.1 pg (ref 26.0–34.0)
MCHC: 30.1 g/dL (ref 30.0–36.0)
MCV: 96.5 fL (ref 80.0–100.0)
Monocytes Absolute: 0.5 10*3/uL (ref 0.1–1.0)
Monocytes Relative: 10 %
Neutro Abs: 2.9 10*3/uL (ref 1.7–7.7)
Neutrophils Relative %: 55 %
Platelets: 278 10*3/uL (ref 150–400)
RBC: 3.68 MIL/uL — ABNORMAL LOW (ref 3.87–5.11)
RDW: 15 % (ref 11.5–15.5)
WBC: 5.2 10*3/uL (ref 4.0–10.5)
nRBC: 0 % (ref 0.0–0.2)

## 2024-05-06 LAB — COMPREHENSIVE METABOLIC PANEL WITH GFR
ALT: 12 U/L (ref 0–44)
AST: 19 U/L (ref 15–41)
Albumin: 3.2 g/dL — ABNORMAL LOW (ref 3.5–5.0)
Alkaline Phosphatase: 30 U/L — ABNORMAL LOW (ref 38–126)
Anion gap: 6 (ref 5–15)
BUN: 31 mg/dL — ABNORMAL HIGH (ref 8–23)
CO2: 22 mmol/L (ref 22–32)
Calcium: 8.3 mg/dL — ABNORMAL LOW (ref 8.9–10.3)
Chloride: 113 mmol/L — ABNORMAL HIGH (ref 98–111)
Creatinine, Ser: 1.22 mg/dL — ABNORMAL HIGH (ref 0.44–1.00)
GFR, Estimated: 40 mL/min — ABNORMAL LOW (ref >60)
Glucose, Bld: 88 mg/dL (ref 70–99)
Potassium: 4.4 mmol/L (ref 3.5–5.1)
Sodium: 141 mmol/L (ref 135–145)
Total Bilirubin: 0.6 mg/dL (ref 0.0–1.2)
Total Protein: 6.3 g/dL — ABNORMAL LOW (ref 6.5–8.1)

## 2024-05-06 NOTE — ED Provider Notes (Signed)
 [facility] Provider Note   Event Date/Time   First MD Initiated Contact with Patient 05/06/24 1025     (approximate) History  Leg Pain  HPI Kathy Villanueva is a 89 y.o. female with a past medical history of senile dementia, chronic kidney disease, hypertension, thoracic aortic aneurysm presents for a mass to the right anterior calf.  Patient was reportedly sent by urgent care for concerns for blood clot.  Patient's guardian at bedside states that this lesion has been there for at least 24 hours but does not know exactly when it started.  Patient is on Eliquis  ROS: Patient currently denies any vision changes, tinnitus, difficulty speaking, facial droop, sore throat, chest pain, shortness of breath, abdominal pain, nausea/vomiting/diarrhea, dysuria, or weakness/numbness/paresthesias in any extremity   Physical Exam  Triage Vital Signs: ED Triage Vitals  Encounter Vitals Group     BP 05/06/24 1017 (!) 186/94     Systolic BP Percentile --      Diastolic BP Percentile --      Pulse Rate 05/06/24 1017 68     Resp 05/06/24 1017 17     Temp 05/06/24 1017 97.7 F (36.5 C)     Temp Source 05/06/24 1017 Oral     SpO2 05/06/24 1017 95 %     Weight --      Height --      Head Circumference --      Peak Flow --      Pain Score 05/06/24 1020 6     Pain Loc --      Pain Education --      Exclude from Growth Chart --    Most recent vital signs: Vitals:   05/06/24 1030 05/06/24 1130  BP: (!) 211/101 (!) 212/92  Pulse: 60 (!) 59  Resp:  18  Temp:  98.6 F (37 C)  SpO2: 100% 97%   General: Awake, oriented x4. CV:  Good peripheral perfusion. Resp:  Normal effort. Abd:  No distention. Other:  Elderly well-developed, well-nourished African-American female resting comfortably in no acute distress.  4 cm x 5 cm area of of fluctuance and tenderness to palpation over the superior anterior lateral right shin ED Results / Procedures / Treatments  Labs (all labs ordered  are listed, but only abnormal results are displayed) Labs Reviewed  COMPREHENSIVE METABOLIC PANEL WITH GFR - Abnormal; Notable for the following components:      Result Value   Chloride 113 (*)    BUN 31 (*)    Creatinine, Ser 1.22 (*)    Calcium 8.3 (*)    Total Protein 6.3 (*)    Albumin  3.2 (*)    Alkaline Phosphatase 30 (*)    GFR, Estimated 40 (*)    All other components within normal limits  CBC WITH DIFFERENTIAL/PLATELET - Abnormal; Notable for the following components:   RBC 3.68 (*)    Hemoglobin 10.7 (*)    HCT 35.5 (*)    All other components within normal limits   RADIOLOGY ED MD interpretation: Doppler ultrasound of the right lower extremity shows a 5.9 x 1.2 x 4.5 cm complex fluid collection anteriorly along the right lower extremity - All radiology independently interpreted and agree with radiology assessment Official radiology report(s): US  Venous Img Lower Unilateral Right Result Date: 05/06/2024 CLINICAL DATA:  Bone bone leg for a few weeks. EXAM: Right LOWER EXTREMITY VENOUS DOPPLER ULTRASOUND TECHNIQUE: Gray-scale sonography with graded compression, as well as color Doppler and duplex  ultrasound were performed to evaluate the lower extremity deep venous systems from the level of the common femoral vein and including the common femoral, femoral, profunda femoral, popliteal and calf veins including the posterior tibial, peroneal and gastrocnemius veins when visible. The superficial great saphenous vein was also interrogated. Spectral Doppler was utilized to evaluate flow at rest and with distal augmentation maneuvers in the common femoral, femoral and popliteal veins. COMPARISON:  Ultrasound 12/02/2022. FINDINGS: Contralateral Common Femoral Vein: Respiratory phasicity is normal and symmetric with the symptomatic side. No evidence of thrombus. Normal compressibility. Common Femoral Vein: No evidence of thrombus. Normal compressibility, respiratory phasicity and response to  augmentation. Saphenofemoral Junction: No evidence of thrombus. Normal compressibility and flow on color Doppler imaging. Profunda Femoral Vein: No evidence of thrombus. Normal compressibility and flow on color Doppler imaging. Femoral Vein: No evidence of thrombus. Normal compressibility, respiratory phasicity and response to augmentation. Popliteal Vein: No evidence of thrombus. Normal compressibility, respiratory phasicity and response to augmentation. Calf Veins: No evidence of thrombus. Normal compressibility and flow on color Doppler imaging. Superficial Great Saphenous Vein: No evidence of thrombus. Normal compressibility. Venous Reflux:  None. Other Findings: There is a complex fluid collection anterior to the right lower leg measuring 5.9 x 1.2 x 4.5 cm. This has several internal echoes. No abnormal blood flow on color Doppler. IMPRESSION: No evidence of right lower extremity DVT. 5.9 x 1.2 x 4.5 cm complex fluid collection anteriorly seen along the right lower extremity. This has a broad differential. Please correlate for any evidence of infectious or inflammatory process or trauma. Recommend either follow-up to confirm resolution or additional workup such as MRI as clinically appropriate. Electronically Signed   By: Adrianna Horde M.D.   On: 05/06/2024 12:17   PROCEDURES: Critical Care performed: No .1-3 Lead EKG Interpretation  Performed by: Charleen Conn, MD Authorized by: Charleen Conn, MD     Interpretation: normal     ECG rate:  71   ECG rate assessment: normal     Rhythm: sinus rhythm     Ectopy: none     Conduction: normal    MEDICATIONS ORDERED IN ED: Medications - No data to display IMPRESSION / MDM / ASSESSMENT AND PLAN / ED COURSE  I reviewed the triage vital signs and the nursing notes.                             The patient is on the cardiac monitor to evaluate for evidence of arrhythmia and/or significant heart rate changes. Patient's presentation is most consistent  with acute presentation with potential threat to life or bodily function. Presentation most consistent with hematoma. Given History, Exam, and Workup I have low suspicion for abscess, cellulitis, Necrotizing Fasciitis, Pyomyositis, Sporotrichosis, Osteomyelitis or other emergent problem as a cause for this presentation. Interventions: None, patient shows no signs of erythema, surrounding induration, or other signs of abscess Disposition: Patient is stable for discharge, advised to follow up with primary care physician in 48 hours.   FINAL CLINICAL IMPRESSION(S) / ED DIAGNOSES   Final diagnoses:  Right leg pain  Hematoma of right lower leg   Rx / DC Orders   ED Discharge Orders     None      Note:  This document was prepared using Dragon voice recognition software and may include unintentional dictation errors.   Charleen Conn, MD 05/06/24 [phone]

## 2024-05-06 NOTE — ED Notes (Signed)
 [place] at bedside

## 2024-05-06 NOTE — ED Triage Notes (Signed)
 Pt to ED via POV from [facility]. Pt reports noticing bump on right anterior leg. Pt reports painful to the touch. Denies SOB or CP. No hx of DVT. Pt is on Eliquis .

## 2024-05-06 NOTE — Progress Notes (Signed)
 89 y/o female who appear malurishesd reporting left lower leg pain. Supposed to be on eliquis  with hx of PE. No sure of what meds actually taking. Complains of right lower leg pain and mass. Elevated bp sent to ed for eval .

## 2024-05-12 DIAGNOSIS — F03918 Unspecified dementia, unspecified severity, with other behavioral disturbance: Secondary | ICD-10-CM | POA: Diagnosis not present

## 2024-05-12 DIAGNOSIS — D631 Anemia in chronic kidney disease: Secondary | ICD-10-CM | POA: Diagnosis not present

## 2024-05-12 DIAGNOSIS — Z0001 Encounter for general adult medical examination with abnormal findings: Secondary | ICD-10-CM | POA: Diagnosis not present

## 2024-05-12 DIAGNOSIS — I129 Hypertensive chronic kidney disease with stage 1 through stage 4 chronic kidney disease, or unspecified chronic kidney disease: Secondary | ICD-10-CM | POA: Diagnosis not present

## 2024-05-12 DIAGNOSIS — E43 Unspecified severe protein-calorie malnutrition: Secondary | ICD-10-CM | POA: Diagnosis not present

## 2024-05-12 DIAGNOSIS — N1832 Chronic kidney disease, stage 3b: Secondary | ICD-10-CM | POA: Diagnosis not present

## 2024-06-04 DIAGNOSIS — R159 Full incontinence of feces: Secondary | ICD-10-CM | POA: Diagnosis not present

## 2024-06-04 DIAGNOSIS — R3981 Functional urinary incontinence: Secondary | ICD-10-CM | POA: Diagnosis not present

## 2024-06-04 DIAGNOSIS — Z993 Dependence on wheelchair: Secondary | ICD-10-CM | POA: Diagnosis not present

## 2024-06-04 DIAGNOSIS — F0390 Unspecified dementia without behavioral disturbance: Secondary | ICD-10-CM | POA: Diagnosis not present

## 2024-06-04 DIAGNOSIS — Z9181 History of falling: Secondary | ICD-10-CM | POA: Diagnosis not present

## 2024-06-04 DIAGNOSIS — N183 Chronic kidney disease, stage 3 unspecified: Secondary | ICD-10-CM | POA: Diagnosis not present

## 2024-06-14 DIAGNOSIS — R053 Chronic cough: Secondary | ICD-10-CM | POA: Diagnosis not present

## 2024-07-04 DIAGNOSIS — N183 Chronic kidney disease, stage 3 unspecified: Secondary | ICD-10-CM | POA: Diagnosis not present

## 2024-07-04 DIAGNOSIS — R159 Full incontinence of feces: Secondary | ICD-10-CM | POA: Diagnosis not present

## 2024-07-04 DIAGNOSIS — Z993 Dependence on wheelchair: Secondary | ICD-10-CM | POA: Diagnosis not present

## 2024-07-04 DIAGNOSIS — R3981 Functional urinary incontinence: Secondary | ICD-10-CM | POA: Diagnosis not present

## 2024-07-04 DIAGNOSIS — F0390 Unspecified dementia without behavioral disturbance: Secondary | ICD-10-CM | POA: Diagnosis not present

## 2024-07-04 DIAGNOSIS — Z9181 History of falling: Secondary | ICD-10-CM | POA: Diagnosis not present

## 2024-07-07 DIAGNOSIS — Z03818 Encounter for observation for suspected exposure to other biological agents ruled out: Secondary | ICD-10-CM | POA: Diagnosis not present

## 2024-07-07 DIAGNOSIS — J029 Acute pharyngitis, unspecified: Secondary | ICD-10-CM | POA: Diagnosis not present

## 2024-07-19 ENCOUNTER — Emergency Department

## 2024-07-19 ENCOUNTER — Other Ambulatory Visit: Payer: Self-pay

## 2024-07-19 ENCOUNTER — Inpatient Hospital Stay
Admission: EM | Admit: 2024-07-19 | Discharge: 2024-07-23 | DRG: 199 | Disposition: A | Discharge status: Hospice | Attending: Internal Medicine | Admitting: Internal Medicine

## 2024-07-19 DIAGNOSIS — I2510 Atherosclerotic heart disease of native coronary artery without angina pectoris: Secondary | ICD-10-CM | POA: Diagnosis present

## 2024-07-19 DIAGNOSIS — Z515 Encounter for palliative care: Secondary | ICD-10-CM

## 2024-07-19 DIAGNOSIS — J939 Pneumothorax, unspecified: Principal | ICD-10-CM | POA: Diagnosis present

## 2024-07-19 DIAGNOSIS — Z681 Body mass index (BMI) 19 or less, adult: Secondary | ICD-10-CM

## 2024-07-19 DIAGNOSIS — F0280 Dementia in other diseases classified elsewhere without behavioral disturbance: Secondary | ICD-10-CM | POA: Diagnosis present

## 2024-07-19 DIAGNOSIS — I10 Essential (primary) hypertension: Secondary | ICD-10-CM | POA: Diagnosis present

## 2024-07-19 DIAGNOSIS — J90 Pleural effusion, not elsewhere classified: Secondary | ICD-10-CM | POA: Diagnosis not present

## 2024-07-19 DIAGNOSIS — K219 Gastro-esophageal reflux disease without esophagitis: Secondary | ICD-10-CM | POA: Diagnosis present

## 2024-07-19 DIAGNOSIS — B9620 Unspecified Escherichia coli [E. coli] as the cause of diseases classified elsewhere: Secondary | ICD-10-CM | POA: Diagnosis present

## 2024-07-19 DIAGNOSIS — I2699 Other pulmonary embolism without acute cor pulmonale: Secondary | ICD-10-CM | POA: Diagnosis present

## 2024-07-19 DIAGNOSIS — G309 Alzheimer's disease, unspecified: Secondary | ICD-10-CM | POA: Diagnosis present

## 2024-07-19 DIAGNOSIS — F03918 Unspecified dementia, unspecified severity, with other behavioral disturbance: Secondary | ICD-10-CM | POA: Diagnosis present

## 2024-07-19 DIAGNOSIS — N1832 Chronic kidney disease, stage 3b: Secondary | ICD-10-CM | POA: Diagnosis present

## 2024-07-19 DIAGNOSIS — Z7901 Long term (current) use of anticoagulants: Secondary | ICD-10-CM | POA: Diagnosis not present

## 2024-07-19 DIAGNOSIS — Z1152 Encounter for screening for COVID-19: Secondary | ICD-10-CM

## 2024-07-19 DIAGNOSIS — R064 Hyperventilation: Secondary | ICD-10-CM | POA: Diagnosis not present

## 2024-07-19 DIAGNOSIS — F03C0 Unspecified dementia, severe, without behavioral disturbance, psychotic disturbance, mood disturbance, and anxiety: Secondary | ICD-10-CM | POA: Diagnosis not present

## 2024-07-19 DIAGNOSIS — R64 Cachexia: Secondary | ICD-10-CM | POA: Diagnosis present

## 2024-07-19 DIAGNOSIS — E43 Unspecified severe protein-calorie malnutrition: Secondary | ICD-10-CM | POA: Diagnosis present

## 2024-07-19 DIAGNOSIS — D539 Nutritional anemia, unspecified: Secondary | ICD-10-CM | POA: Diagnosis present

## 2024-07-19 DIAGNOSIS — E034 Atrophy of thyroid (acquired): Secondary | ICD-10-CM | POA: Diagnosis not present

## 2024-07-19 DIAGNOSIS — I5022 Chronic systolic (congestive) heart failure: Secondary | ICD-10-CM | POA: Diagnosis present

## 2024-07-19 DIAGNOSIS — J984 Other disorders of lung: Secondary | ICD-10-CM | POA: Diagnosis not present

## 2024-07-19 DIAGNOSIS — J948 Other specified pleural conditions: Secondary | ICD-10-CM | POA: Diagnosis present

## 2024-07-19 DIAGNOSIS — E7800 Pure hypercholesterolemia, unspecified: Secondary | ICD-10-CM | POA: Diagnosis present

## 2024-07-19 DIAGNOSIS — R918 Other nonspecific abnormal finding of lung field: Secondary | ICD-10-CM | POA: Diagnosis not present

## 2024-07-19 DIAGNOSIS — R627 Adult failure to thrive: Secondary | ICD-10-CM | POA: Diagnosis present

## 2024-07-19 DIAGNOSIS — Z66 Do not resuscitate: Secondary | ICD-10-CM | POA: Diagnosis present

## 2024-07-19 DIAGNOSIS — Z86711 Personal history of pulmonary embolism: Secondary | ICD-10-CM | POA: Diagnosis not present

## 2024-07-19 DIAGNOSIS — R531 Weakness: Secondary | ICD-10-CM | POA: Diagnosis not present

## 2024-07-19 DIAGNOSIS — I712 Thoracic aortic aneurysm, without rupture, unspecified: Secondary | ICD-10-CM | POA: Diagnosis present

## 2024-07-19 DIAGNOSIS — N390 Urinary tract infection, site not specified: Secondary | ICD-10-CM | POA: Diagnosis present

## 2024-07-19 DIAGNOSIS — Z79899 Other long term (current) drug therapy: Secondary | ICD-10-CM

## 2024-07-19 DIAGNOSIS — I130 Hypertensive heart and chronic kidney disease with heart failure and stage 1 through stage 4 chronic kidney disease, or unspecified chronic kidney disease: Secondary | ICD-10-CM | POA: Diagnosis not present

## 2024-07-19 DIAGNOSIS — J9601 Acute respiratory failure with hypoxia: Secondary | ICD-10-CM | POA: Diagnosis present

## 2024-07-19 DIAGNOSIS — R0602 Shortness of breath: Secondary | ICD-10-CM | POA: Diagnosis not present

## 2024-07-19 DIAGNOSIS — J9311 Primary spontaneous pneumothorax: Secondary | ICD-10-CM | POA: Diagnosis not present

## 2024-07-19 LAB — HEPATIC FUNCTION PANEL
ALT: 13 U/L (ref 0–44)
AST: 24 U/L (ref 15–41)
Albumin: 3.9 g/dL (ref 3.5–5.0)
Alkaline Phosphatase: 43 U/L (ref 38–126)
Bilirubin, Direct: 0.2 mg/dL (ref 0.0–0.2)
Indirect Bilirubin: 0.5 mg/dL (ref 0.3–0.9)
Total Bilirubin: 0.7 mg/dL (ref 0.0–1.2)
Total Protein: 7.8 g/dL (ref 6.5–8.1)

## 2024-07-19 LAB — BASIC METABOLIC PANEL WITH GFR
Anion gap: 11 (ref 5–15)
BUN: 30 mg/dL — ABNORMAL HIGH (ref 8–23)
CO2: 20 mmol/L — ABNORMAL LOW (ref 22–32)
Calcium: 9 mg/dL (ref 8.9–10.3)
Chloride: 111 mmol/L (ref 98–111)
Creatinine, Ser: 1.31 mg/dL — ABNORMAL HIGH (ref 0.44–1.00)
GFR, Estimated: 37 mL/min — ABNORMAL LOW (ref >60)
Glucose, Bld: 111 mg/dL — ABNORMAL HIGH (ref 70–99)
Potassium: 4.6 mmol/L (ref 3.5–5.1)
Sodium: 142 mmol/L (ref 135–145)

## 2024-07-19 LAB — PROTIME-INR
INR: 1.4 — ABNORMAL HIGH (ref 0.8–1.2)
Prothrombin Time: 18.3 s — ABNORMAL HIGH (ref 11.4–15.2)

## 2024-07-19 LAB — CBC
HCT: 39.4 % (ref 36.0–46.0)
Hemoglobin: 11.7 g/dL — ABNORMAL LOW (ref 12.0–15.0)
MCH: 29.8 pg (ref 26.0–34.0)
MCHC: 29.7 g/dL — ABNORMAL LOW (ref 30.0–36.0)
MCV: 100.3 fL — ABNORMAL HIGH (ref 80.0–100.0)
Platelets: 222 K/uL (ref 150–400)
RBC: 3.93 MIL/uL (ref 3.87–5.11)
RDW: 16.1 % — ABNORMAL HIGH (ref 11.5–15.5)
WBC: 9 K/uL (ref 4.0–10.5)
nRBC: 0 % (ref 0.0–0.2)

## 2024-07-19 LAB — URINALYSIS, W/ REFLEX TO CULTURE (INFECTION SUSPECTED)
Bilirubin Urine: NEGATIVE
Glucose, UA: NEGATIVE mg/dL
Ketones, ur: NEGATIVE mg/dL
Nitrite: NEGATIVE
Protein, ur: 30 mg/dL — AB
RBC / HPF: >50 RBC/hpf (ref 0–5)
Specific Gravity, Urine: 1.023 (ref 1.005–1.030)
Squamous Epithelial / HPF: 0 /HPF (ref 0–5)
WBC, UA: >50 WBC/hpf (ref 0–5)
pH: 6 (ref 5.0–8.0)

## 2024-07-19 LAB — MAGNESIUM: Magnesium: 1.9 mg/dL (ref 1.7–2.4)

## 2024-07-19 LAB — D-DIMER, QUANTITATIVE: D-Dimer, Quant: 2.07 ug{FEU}/mL — ABNORMAL HIGH (ref 0.00–0.50)

## 2024-07-19 LAB — TROPONIN I (HIGH SENSITIVITY): Troponin I (High Sensitivity): 8 ng/L (ref <18)

## 2024-07-19 MED ORDER — IOHEXOL 350 MG/ML SOLN
60.0000 mL | Freq: Once | INTRAVENOUS | Status: AC | PRN
  Administered 2024-07-19 (×2): 60 mL via INTRAVENOUS

## 2024-07-19 MED ORDER — ENSURE ENLIVE PO LIQD
237.0000 mL | Freq: Two times a day (BID) | ORAL | Status: DC
  Administered 2024-07-20 (×2): 237 mL via ORAL
  Filled 2024-07-19: qty 237

## 2024-07-19 MED ORDER — ADULT MULTIVITAMIN W/MINERALS CH
1.0000 | ORAL_TABLET | Freq: Every day | ORAL | Status: DC
  Administered 2024-07-21 – 2024-07-23 (×4): 1 via ORAL
  Filled 2024-07-19 (×4): qty 1

## 2024-07-19 MED ORDER — LACTATED RINGERS IV SOLN
INTRAVENOUS | Status: DC
  Administered 2024-07-20 (×2): 1000 mL via INTRAVENOUS

## 2024-07-19 MED ORDER — IPRATROPIUM-ALBUTEROL 0.5-2.5 (3) MG/3ML IN SOLN: 3.0000 mL | Freq: Four times a day (QID) | RESPIRATORY_TRACT | Status: DC | PRN

## 2024-07-19 MED ORDER — APIXABAN 2.5 MG PO TABS
2.5000 mg | ORAL_TABLET | Freq: Two times a day (BID) | ORAL | Status: DC
  Administered 2024-07-20 – 2024-07-23 (×13): 2.5 mg via ORAL
  Filled 2024-07-19 (×8): qty 1

## 2024-07-19 MED ORDER — SODIUM CHLORIDE 0.9 % IV SOLN
1.0000 g | Freq: Once | INTRAVENOUS | Status: DC
  Filled 2024-07-19: qty 10

## 2024-07-19 MED ORDER — DM-GUAIFENESIN ER 30-600 MG PO TB12: 1.0000 | ORAL_TABLET | Freq: Two times a day (BID) | ORAL | Status: DC | PRN

## 2024-07-19 MED ORDER — SODIUM CHLORIDE 0.9 % IV SOLN
1.0000 g | INTRAVENOUS | Status: DC
  Administered 2024-07-19 – 2024-07-22 (×7): 1 g via INTRAVENOUS
  Filled 2024-07-19 (×4): qty 10

## 2024-07-19 MED ORDER — ACETAMINOPHEN 650 MG RE SUPP: 650.0000 mg | Freq: Four times a day (QID) | RECTAL | Status: DC | PRN

## 2024-07-19 MED ORDER — ONDANSETRON HCL 4 MG/2ML IJ SOLN: 4.0000 mg | Freq: Four times a day (QID) | INTRAMUSCULAR | Status: DC | PRN

## 2024-07-19 MED ORDER — SODIUM CHLORIDE 0.9 % IV SOLN
500.0000 mg | INTRAVENOUS | Status: DC
  Administered 2024-07-20 – 2024-07-21 (×4): 500 mg via INTRAVENOUS
  Filled 2024-07-19 (×2): qty 5

## 2024-07-19 MED ORDER — ACETAMINOPHEN 325 MG PO TABS
650.0000 mg | ORAL_TABLET | Freq: Four times a day (QID) | ORAL | Status: DC | PRN
  Administered 2024-07-22 – 2024-07-23 (×3): 650 mg via ORAL
  Filled 2024-07-19 (×3): qty 2

## 2024-07-19 MED ORDER — PANTOPRAZOLE SODIUM 40 MG PO TBEC
40.0000 mg | DELAYED_RELEASE_TABLET | Freq: Every day | ORAL | Status: DC
  Administered 2024-07-20 – 2024-07-23 (×6): 40 mg via ORAL
  Filled 2024-07-19 (×4): qty 1

## 2024-07-19 MED ORDER — POLYETHYLENE GLYCOL 3350 17 G PO PACK
17.0000 g | PACK | Freq: Every day | ORAL | Status: DC
  Administered 2024-07-20 (×2): 17 g via ORAL
  Filled 2024-07-19 (×2): qty 1

## 2024-07-19 MED ORDER — ONDANSETRON HCL 4 MG PO TABS: 4.0000 mg | ORAL_TABLET | Freq: Four times a day (QID) | ORAL | Status: DC | PRN

## 2024-07-19 MED ORDER — HYDRALAZINE HCL 20 MG/ML IJ SOLN
5.0000 mg | Freq: Four times a day (QID) | INTRAMUSCULAR | Status: DC | PRN
  Administered 2024-07-20 (×2): 5 mg via INTRAVENOUS
  Filled 2024-07-19: qty 1

## 2024-07-19 NOTE — ED Provider Notes (Signed)
 [facility] Provider Note    Event Date/Time   First MD Initiated Contact with Patient 07/19/24 1646     (approximate)   History   Shortness of Breath   HPI  Kathy Villanueva is a 89 year old female presenting to the ER for evaluation of weakness and shortness of breath.  Patient denies complaints currently, but son and granddaughter at bedside reports that patient has had some increased weakness and earlier today he was complaining of chest pain.  Has also reported some shortness of breath over the last week.  No fevers.  No confusion.  No falls.      Physical Exam   Triage Vital Signs: ED Triage Vitals  Encounter Vitals Group     BP 07/19/24 1415 101/69     Girls Systolic BP Percentile --      Girls Diastolic BP Percentile --      Boys Systolic BP Percentile --      Boys Diastolic BP Percentile --      Pulse Rate 07/19/24 1415 (!) 56     Resp 07/19/24 1415 16     Temp 07/19/24 1415 (!) 97 F (36.1 C)     Temp src --      SpO2 07/19/24 1415 100 %     Weight 07/19/24 1421 81 lb (36.7 kg)     Height 07/19/24 1421 5' 4 (1.626 m)     Head Circumference --      Peak Flow --      Pain Score 07/19/24 1421 0     Pain Loc --      Pain Education --      Exclude from Growth Chart --     Most recent vital signs: Vitals:   07/19/24 2115 07/19/24 2130  BP: (!) 207/115 (!) 191/100  Pulse:  65  Resp: (!) 22 (!) 27  Temp:    SpO2:  100%     General: Awake, interactive  CV:  Bradycardic with regular rhythm, good peripheral perfusion Resp:  Unlabored respirations, lungs clear to auscultation Abd:  Nondistended, soft, no appreciable tenderness Neuro:  Symmetric facial movement, fluid speech   ED Results / Procedures / Treatments   Labs (all labs ordered are listed, but only abnormal results are displayed) Labs Reviewed  BASIC METABOLIC PANEL WITH GFR - Abnormal; Notable for the following components:      Result Value   CO2 20 (*)     Glucose, Bld 111 (*)    BUN 30 (*)    Creatinine, Ser 1.31 (*)    GFR, Estimated 37 (*)    All other components within normal limits  D-DIMER, QUANTITATIVE - Abnormal; Notable for the following components:   D-Dimer, Quant 2.07 (*)    All other components within normal limits  CBC - Abnormal; Notable for the following components:   Hemoglobin 11.7 (*)    MCV 100.3 (*)    MCHC 29.7 (*)    RDW 16.1 (*)    All other components within normal limits  PROTIME-INR - Abnormal; Notable for the following components:   Prothrombin Time 18.3 (*)    INR 1.4 (*)    All other components within normal limits  HEPATIC FUNCTION PANEL  MAGNESIUM   URINALYSIS, W/ REFLEX TO CULTURE (INFECTION SUSPECTED)  TROPONIN I (HIGH SENSITIVITY)     EKG EKG independently reviewed and interpreted by myself demonstrates:  EKG demonstrates sinus bradycardia at a rate of 59, PR 142, QRS 82, QTc 423,  nonspecific ST changes  RADIOLOGY Imaging independently reviewed and interpreted by myself demonstrates:  CXR with new focal area over the left base concerning for consolidation, radiology notes possible skinfold versus pneumothorax  CTA of the chest formal read pending, on my review no obvious large central PE, does note a small pneumothorax  Formal Radiology Read:  CT Angio Chest PE W and/or Wo Contrast Result Date: 07/19/2024 CLINICAL DATA:  High probability pulmonary embolism, dyspnea EXAM: CT ANGIOGRAPHY CHEST WITH CONTRAST TECHNIQUE: Multidetector CT imaging of the chest was performed using the standard protocol during bolus administration of intravenous contrast. Multiplanar CT image reconstructions and MIPs were obtained to evaluate the vascular anatomy. RADIATION DOSE REDUCTION: This exam was performed according to the departmental dose-optimization program which includes automated exposure control, adjustment of the mA and/or kV according to patient size and/or use of iterative reconstruction technique.  CONTRAST:  60mL OMNIPAQUE  IOHEXOL  350 MG/ML SOLN COMPARISON:  03/15/2008 FINDINGS: Cardiovascular: There is adequate opacification of the pulmonary arterial tree. The central pulmonary arteries are enlarged in keeping with changes of pulmonary arterial hypertension. There is evidence of remote pulmonary embolism with chronic thrombosis of the right middle lobe are and segmental pulmonary arteries (85/4). No intraluminal filling defect is identified to suggest acute pulmonary embolism. Extensive multi-vessel coronary artery calcification. Global cardiac size is within normal limits. The descending thoracic aorta demonstrates fusiform dilation measuring 4.0 cm in Diane proximally and 3.0 cm in diameter distally just above the diaphragmatic hiatus. The ascending aorta is of normal caliber. This appears progressive since prior examination where this measured 3.7 cm in maximal diameter. Additionally, there is a focal disruption of mural calcification and soft tissue excrescence involving the aortic arch at 52/4 measuring 15 mm in diameter and 8 mm in depth suspicious for a penetrating atherosclerotic ulcer. This is not well evaluated on this examination due to bolus timing. Mediastinum/Nodes: Visualized thyroid  is atrophic. No pathologic thoracic adenopathy. Moderate mass effect upon the distal esophagus by the crossing aorta without evidence of obstruction. Lungs/Pleura: Moderate left hydropneumothorax is present with a small basilar dependent fluid component and small to moderate gaseous component. No mediastinal shift or hyperexpansion of the left hemithorax to suggest tension physiology. Mild emphysema. No pneumothorax on the right. No pleural effusion on the right. Bronchial wall thickening noted in keeping with airway inflammation. Bronchomalacia involving the right mainstem bronchus and bronchus intermedius. Upper Abdomen: 12 mm nonobstructing calculus within the visualized right renal hilum. No acute abnormality.  Musculoskeletal: No chest wall abnormality. No acute or significant osseous findings. Review of the MIP images confirms the above findings. IMPRESSION: 1. No evidence of acute pulmonary embolism. 2. Moderate left hydropneumothorax. No mediastinal shift or hyperexpansion of the left hemithorax to suggest tension physiology. 3. Fusiform dilation of the descending thoracic aorta measuring up to 4.0 cm in diameter, progressive since prior examination. If clinically indicated given the patient's medical comorbidities, recommend semi-annual imaging followup by CTA or MRA and referral to cardiothoracic surgery if not already obtained. This recommendation follows 2010 ACCF/AHA/AATS/ACR/ASA/SCA/SCAI/SIR/STS/SVM Guidelines for the Diagnosis and Management of Patients With Thoracic Aortic Disease. Circulation. 2010; 121: [id number]. Aortic aneurysm NOS (ICD10-I71.9) 4. Focal disruption of mural calcification and soft tissue excrescence involving the aortic arch measuring 15 mm in diameter and 8 mm in depth suspicious for a penetrating atherosclerotic ulcer. This is not well evaluated on this examination due to bolus timing. Dedicated nonemergent CT arteriography of the thoracic aorta would be helpful for further evaluation if clinically indicated. 5. Extensive  multi-vessel coronary artery calcification. 6. Mild emphysema. 7. Bronchial wall thickening in keeping with airway inflammation. Bronchomalacia involving the right mainstem bronchus and bronchus intermedius. 8. 12 mm nonobstructing calculus within the visualized right renal hilum. Aortic Atherosclerosis (ICD10-I70.0) and Emphysema (ICD10-J43.9). Electronically Signed   By: Dorethia Molt M.D.   On: 07/19/2024 21:52   DG Chest Port 1 View Result Date: 07/19/2024 EXAM: 1 VIEW XRAY OF THE CHEST 07/19/2024 03:17:00 PM COMPARISON: 12/03/2022 CLINICAL HISTORY: Labored breathing FINDINGS: LUNGS AND PLEURA: Hyperinflation. Clearing of right lower lobe airspace disease. Patchy  left basilar airspace disease is new. Small left pleural effusion. Favor a skin fold over the superolateral left hemithorax. HEART AND MEDIASTINUM: Normal heart size. Tortuous thoracic aorta. BONES AND SOFT TISSUES: No acute osseous abnormality. IMPRESSION: 1. New patchy left basilar airspace disease, favoring pneumonia or aspiration . 2. Small left pleural effusion. 3. Probable skin fold over the superolateral left hemithorax. If high clinical concern of tiny pneumothorax, consider PA radiograph. Electronically signed by: Rockey Kilts MD 07/19/2024 04:23 PM EDT RP Workstation: [id number]    PROCEDURES:  Critical Care performed: Yes, see critical care procedure note(s)  CRITICAL CARE Performed by: Nilsa Dade   Total critical care time: 32 minutes  Critical care time was exclusive of separately billable procedures and treating other patients.  Critical care was necessary to treat or prevent imminent or life-threatening deterioration.  Critical care was time spent personally by me on the following activities: development of treatment plan with patient and/or surrogate as well as nursing, discussions with consultants, evaluation of patient's response to treatment, examination of patient, obtaining history from patient or surrogate, ordering and performing treatments and interventions, ordering and review of laboratory studies, ordering and review of radiographic studies, pulse oximetry and re-evaluation of patient's condition.   Procedures   MEDICATIONS ORDERED IN ED: Medications  iohexol  (OMNIPAQUE ) 350 MG/ML injection 60 mL (60 mLs Intravenous Contrast Given 07/19/24 1942)     IMPRESSION / MDM / ASSESSMENT AND PLAN / ED COURSE  I reviewed the triage vital signs and the nursing notes.  Differential diagnosis includes, but is not limited to, anemia, electrolyte abnormality, pneumonia, UTI, PE, ACS, pneumothorax  Patient's presentation is most consistent with acute presentation with  potential threat to life or bodily function.  89 year old female presenting to the emergency department for evaluation of weakness with recent chest pain and shortness of breath.  Mild bradycardia on presentation, otherwise stable vitals.  Will obtain labs, x-Brainard Highfill, urine to further evaluate.  Labs with CBC, CMP without critical derangements.  LFTs within normal limits.  Negative troponin.  Chest x-Teneil Shiller without obvious focal consolidation, questionable pneumonia as well as possible small pneumothorax noted.  CTA of the chest pending formal read, but without obvious large central PE on my review.  Does demonstrate a small pneumothorax.  Discussed with patient and family.  With patient's age, they would not wish to proceed with tube thoracostomy for the patient which is reasonable especially with small size of pneumothorax. They are agreeable with placing the patient on a nonrebreather and admission for conservative management.  Will reach out to hospitalist team.  Clinical Course as of 07/19/24 2247  Mon Jul 19, 2024  2157 Received update from radiologist regarding patient's CT findings.  Did note pneumo as previously noted, describes this as small to moderate, does have a fluid component.  Also notes some nonemergent findings including abnormalities of the aortic arch and coronary artery disease.  Continue plan for admission. [NR]  2200  Case reviewed with hospitalist team.  They will evaluate for anticipated admission. [NR]  2246 Urinalysis, w/ Reflex to Culture (Infection Suspected) -Urine, Clean Catch(!) After discussion with hospitalist, but prior to their evaluation I do note that patient's urinalysis resulted concerning for urine infection.  With her weakness we will go ahead and treat with Rocephin .  Urine culture sent. [NR]    Clinical Course User Index [NR] Levander Slate, MD       FINAL CLINICAL IMPRESSION(S) / ED DIAGNOSES   Final diagnoses:  Pneumothorax, unspecified type     Rx / DC  Orders   ED Discharge Orders     None        Note:  This document was prepared using Dragon voice recognition software and may include unintentional dictation errors.   Levander Slate, MD 07/19/24 2206

## 2024-07-19 NOTE — H&P (Signed)
 History and Physical    Patient: Kathy Villanueva FMW:[id number] DOB: 1924-08-14 DOA: 07/19/2024 DOS: the patient was seen and examined on 07/19/2024 PCP: Rudolpho Norleen BIRCH, MD  Patient coming from: Home  Chief Complaint:  Chief Complaint  Patient presents with   Shortness of Breath   HPI: Kathy Villanueva is a 89 y.o. female with medical history significant of Hypertension, CKD 3, PE on Eliquis , presents to the emergency department for evaluation of worsening shortness of breath for the past week or so.  History is per granddaughter present at bedside. Granddaughter states that the patient has become short of breath with minimal activity and reported new left-sided chest pain and foul smelling urine today.  She does have occasional cough and watery diarrhea for the past 2 days.  No fevers or sick contacts.  Has had decreased appetite recently.  Mental status has been at baseline. Lives at home with the help of home health aides  In the emergency department, she was hypothermic , tachypneic but saturating appropriately on room air, labile blood pressure ranging from 101/69 to 207/115, and in sinus bradycardia.   CTA was  negative for acute pulmonary embolism, however significant for a  moderate size left hydropneumothorax and possible bronchitis. Troponin negative, EKG sinus bradycardia with occasional PAC UA with UTI.  Family does not wish to pursue chest tube.  Granddaughter at bedside stated per recent conversations she has had with her grandmother, she wishes to be full code.  Hospitalist to admit for further management   Review of Systems: Review of Systems  Unable to perform ROS: Age  Constitutional:  Positive for malaise/fatigue and weight loss. Negative for chills and fever.  Respiratory:  Positive for cough and shortness of breath. Negative for sputum production and wheezing.   Cardiovascular:  Positive for chest pain. Negative for leg swelling.  Gastrointestinal:  Positive for  diarrhea. Negative for abdominal pain, blood in stool and vomiting.  Neurological:  Positive for weakness. Negative for dizziness, speech change and focal weakness.    Past Medical History:  Diagnosis Date   Arthritis    hand   Chronic systolic CHF (congestive heart failure) (HCC)    GERD (gastroesophageal reflux disease)    Gout    hand   Gross hematuria    Hematoma of kidney    History of kidney stones    HTN (hypertension)    controlled on meds   Hydronephrosis    Hypercholesteremia    Kidney stones    Nausea    Transfusion history    Ureteral stone    Past Surgical History:  Procedure Laterality Date   ABDOMINAL HYSTERECTOMY     CATARACT EXTRACTION W/PHACO Left 10/20/2019   Procedure: CATARACT EXTRACTION PHACO AND INTRAOCULAR LENS PLACEMENT (IOC) LEFT MALYUGIN 01:25.9,  13.6%, 11.71;  Surgeon: Mittie Gaskin, MD;  Location: [facility];  Service: Ophthalmology;  Laterality: Left;   COLONOSCOPY     KIDNEY STONE SURGERY     LAPAROTOMY N/A 10/04/2021   Procedure: EXPLORATORY LAPAROTOMY;  Surgeon: Rodolph Romano, MD;  Location: [facility];  Service: General;  Laterality: N/A;   LYSIS OF ADHESION N/A 10/04/2021   Procedure: LYSIS OF ADHESION;  Surgeon: Rodolph Romano, MD;  Location: [facility];  Service: General;  Laterality: N/A;   Social History:  reports that she has never smoked. She has never used smokeless tobacco. She reports that she does not drink alcohol  and does not use drugs.  No Known Allergies  Family History  Problem Relation  Age of Onset   Kidney disease Neg Hx    Bladder Cancer Neg Hx     Prior to Admission medications   Medication Sig Start Date End Date Taking? Authorizing Provider  apixaban  (ELIQUIS ) 2.5 MG TABS tablet Take 1 tablet (2.5 mg total) by mouth 2 (two) times daily. 12/07/22   Patel, Sona, MD  carvedilol  (COREG ) 3.125 MG tablet Take 3.125 mg by mouth 2 (two) times daily with a meal. 11/29/22 12/29/22  [provider]  cyanocobalamin  (VITAMIN B12) 1000 MCG tablet Take 1 tablet by mouth daily. 11/13/21   [provider]  dextromethorphan-guaiFENesin  (MUCINEX  DM) 30-600 MG 12hr tablet Take 1 tablet by mouth 2 (two) times daily as needed for cough. 12/07/22   Patel, Sona, MD  feeding supplement (ENSURE ENLIVE / ENSURE PLUS) LIQD Take 237 mLs by mouth 2 (two) times daily between meals. 12/08/22   Patel, Sona, MD  Multiple Vitamin (MULTIVITAMIN WITH MINERALS) TABS tablet Take 1 tablet by mouth daily. 12/08/22   Patel, Sona, MD  pantoprazole  (PROTONIX ) 40 MG tablet Take 1 tablet (40 mg total) by mouth daily. 10/14/21 12/02/22  Adhikari, Amrit, MD  polyethylene glycol (MIRALAX ) 17 g packet Take 17 g by mouth daily. 10/14/21   Jillian Buttery, MD    Physical Exam: Vitals:   07/19/24 2000 07/19/24 2030 07/19/24 2115 07/19/24 2130  BP: (!) 169/81 (!) 150/90 (!) 207/115 (!) 191/100  Pulse:  (!) 53  65  Resp: (!) 25 (!) 24 (!) 22 (!) 27  Temp:      SpO2:  96%  100%  Weight:      Height:      Physical Exam Constitutional:      Appearance: She is ill-appearing (chronically). She is not toxic-appearing.  HENT:     Head: Normocephalic and atraumatic.  Eyes:     Extraocular Movements: Extraocular movements intact.     Pupils: Pupils are equal, round, and reactive to light.  Cardiovascular:     Rate and Rhythm: Normal rate and regular rhythm.  Pulmonary:     Effort: Pulmonary effort is normal. No tachypnea or accessory muscle usage.     Breath sounds: Decreased breath sounds present. No wheezing, rhonchi or rales.     Comments: On NRB  Chest:     Chest wall: No tenderness or edema.  Musculoskeletal:     Cervical back: Normal range of motion.     Right lower leg: No tenderness. No edema.     Left lower leg: No tenderness. No edema.  Skin:    General: Skin is warm and dry.  Neurological:     Mental Status: She is alert.     Comments: At baseline per grand daughter         Data  Reviewed:   Labs on Admission: I have personally reviewed following labs and imaging studies  CBC: Recent Labs  Lab 07/19/24 1715  WBC 9.0  HGB 11.7*  HCT 39.4  MCV 100.3*  PLT 222   Basic Metabolic Panel: Recent Labs  Lab 07/19/24 1502 07/19/24 1715  NA 142  --   K 4.6  --   CL 111  --   CO2 20*  --   GLUCOSE 111*  --   BUN 30*  --   CREATININE 1.31*  --   CALCIUM 9.0  --   MG  --  1.9   GFR: Estimated Creatinine Clearance: 13.9 mL/min (A) (by C-G formula based on SCr of 1.31 mg/dL (  H)). Liver Function Tests: Recent Labs  Lab 07/19/24 1715  AST 24  ALT 13  ALKPHOS 43  BILITOT 0.7  PROT 7.8  ALBUMIN  3.9   No results for input(s): LIPASE, AMYLASE in the last 168 hours. No results for input(s): AMMONIA in the last 168 hours. Coagulation Profile: Recent Labs  Lab 07/19/24 1715  INR 1.4*   Cardiac Enzymes: No results for input(s): CKTOTAL, CKMB, CKMBINDEX, TROPONINI in the last 168 hours. BNP (last 3 results) No results for input(s): PROBNP in the last 8760 hours. HbA1C: No results for input(s): HGBA1C in the last 72 hours. CBG: No results for input(s): GLUCAP in the last 168 hours. Lipid Profile: No results for input(s): CHOL, HDL, LDLCALC, TRIG, CHOLHDL, LDLDIRECT in the last 72 hours. Thyroid  Function Tests: No results for input(s): TSH, T4TOTAL, FREET4, T3FREE, THYROIDAB in the last 72 hours. Anemia Panel: No results for input(s): VITAMINB12, FOLATE, FERRITIN, TIBC, IRON, RETICCTPCT in the last 72 hours. Urine analysis:    Component Value Date/Time   COLORURINE YELLOW (A) 07/19/2024 2108   APPEARANCEUR TURBID (A) 07/19/2024 2108   APPEARANCEUR Clear 11/10/2015 1022   LABSPEC 1.023 07/19/2024 2108   PHURINE 6.0 07/19/2024 2108   GLUCOSEU NEGATIVE 07/19/2024 2108   HGBUR LARGE (A) 07/19/2024 2108   BILIRUBINUR NEGATIVE 07/19/2024 2108   BILIRUBINUR Negative 11/10/2015 1022   KETONESUR  NEGATIVE 07/19/2024 2108   PROTEINUR 30 (A) 07/19/2024 2108   NITRITE NEGATIVE 07/19/2024 2108   LEUKOCYTESUR LARGE (A) 07/19/2024 2108    Radiological Exams on Admission: CT Angio Chest PE W and/or Wo Contrast Addendum Date: 07/19/2024 ADDENDUM REPORT: 07/19/2024 22:25 ADDENDUM: These results were called by telephone at the time of interpretation on 07/19/2024 at 9:58 Pm to provider NEHA RAY , who verbally acknowledged these results. Electronically Signed   By: Dorethia Molt M.D.   On: 07/19/2024 22:25   Result Date: 07/19/2024 CLINICAL DATA:  High probability pulmonary embolism, dyspnea EXAM: CT ANGIOGRAPHY CHEST WITH CONTRAST TECHNIQUE: Multidetector CT imaging of the chest was performed using the standard protocol during bolus administration of intravenous contrast. Multiplanar CT image reconstructions and MIPs were obtained to evaluate the vascular anatomy. RADIATION DOSE REDUCTION: This exam was performed according to the departmental dose-optimization program which includes automated exposure control, adjustment of the mA and/or kV according to patient size and/or use of iterative reconstruction technique. CONTRAST:  60mL OMNIPAQUE  IOHEXOL  350 MG/ML SOLN COMPARISON:  03/15/2008 FINDINGS: Cardiovascular: There is adequate opacification of the pulmonary arterial tree. The central pulmonary arteries are enlarged in keeping with changes of pulmonary arterial hypertension. There is evidence of remote pulmonary embolism with chronic thrombosis of the right middle lobe are and segmental pulmonary arteries (85/4). No intraluminal filling defect is identified to suggest acute pulmonary embolism. Extensive multi-vessel coronary artery calcification. Global cardiac size is within normal limits. The descending thoracic aorta demonstrates fusiform dilation measuring 4.0 cm in Diane proximally and 3.0 cm in diameter distally just above the diaphragmatic hiatus. The ascending aorta is of normal caliber. This  appears progressive since prior examination where this measured 3.7 cm in maximal diameter. Additionally, there is a focal disruption of mural calcification and soft tissue excrescence involving the aortic arch at 52/4 measuring 15 mm in diameter and 8 mm in depth suspicious for a penetrating atherosclerotic ulcer. This is not well evaluated on this examination due to bolus timing. Mediastinum/Nodes: Visualized thyroid  is atrophic. No pathologic thoracic adenopathy. Moderate mass effect upon the distal esophagus by the crossing aorta without  evidence of obstruction. Lungs/Pleura: Moderate left hydropneumothorax is present with a small basilar dependent fluid component and small to moderate gaseous component. No mediastinal shift or hyperexpansion of the left hemithorax to suggest tension physiology. Mild emphysema. No pneumothorax on the right. No pleural effusion on the right. Bronchial wall thickening noted in keeping with airway inflammation. Bronchomalacia involving the right mainstem bronchus and bronchus intermedius. Upper Abdomen: 12 mm nonobstructing calculus within the visualized right renal hilum. No acute abnormality. Musculoskeletal: No chest wall abnormality. No acute or significant osseous findings. Review of the MIP images confirms the above findings. IMPRESSION: 1. No evidence of acute pulmonary embolism. 2. Moderate left hydropneumothorax. No mediastinal shift or hyperexpansion of the left hemithorax to suggest tension physiology. 3. Fusiform dilation of the descending thoracic aorta measuring up to 4.0 cm in diameter, progressive since prior examination. If clinically indicated given the patient's medical comorbidities, recommend semi-annual imaging followup by CTA or MRA and referral to cardiothoracic surgery if not already obtained. This recommendation follows 2010 ACCF/AHA/AATS/ACR/ASA/SCA/SCAI/SIR/STS/SVM Guidelines for the Diagnosis and Management of Patients With Thoracic Aortic Disease.  Circulation. 2010; 121: [id number]. Aortic aneurysm NOS (ICD10-I71.9) 4. Focal disruption of mural calcification and soft tissue excrescence involving the aortic arch measuring 15 mm in diameter and 8 mm in depth suspicious for a penetrating atherosclerotic ulcer. This is not well evaluated on this examination due to bolus timing. Dedicated nonemergent CT arteriography of the thoracic aorta would be helpful for further evaluation if clinically indicated. 5. Extensive multi-vessel coronary artery calcification. 6. Mild emphysema. 7. Bronchial wall thickening in keeping with airway inflammation. Bronchomalacia involving the right mainstem bronchus and bronchus intermedius. 8. 12 mm nonobstructing calculus within the visualized right renal hilum. Aortic Atherosclerosis (ICD10-I70.0) and Emphysema (ICD10-J43.9). Electronically Signed: By: Dorethia Molt M.D. On: 07/19/2024 21:52   DG Chest Port 1 View Result Date: 07/19/2024 EXAM: 1 VIEW XRAY OF THE CHEST 07/19/2024 03:17:00 PM COMPARISON: 12/03/2022 CLINICAL HISTORY: Labored breathing FINDINGS: LUNGS AND PLEURA: Hyperinflation. Clearing of right lower lobe airspace disease. Patchy left basilar airspace disease is new. Small left pleural effusion. Favor a skin fold over the superolateral left hemithorax. HEART AND MEDIASTINUM: Normal heart size. Tortuous thoracic aorta. BONES AND SOFT TISSUES: No acute osseous abnormality. IMPRESSION: 1. New patchy left basilar airspace disease, favoring pneumonia or aspiration . 2. Small left pleural effusion. 3. Probable skin fold over the superolateral left hemithorax. If high clinical concern of tiny pneumothorax, consider PA radiograph. Electronically signed by: Rockey Kilts MD 07/19/2024 04:23 PM EDT RP Workstation: [id number]        Assessment and Plan:  89 year old female with hypertension, PE on Eliquis ,  presents to the emergency department for evaluation of shortness of breath and foul-smelling urine.  Found to have  UTI and hydropneumothorax, family electing for conservative management.   Hydropneumothorax, small to moderate Bronchitis - Conservative management, continue nonrebreather overnight.  Saturating appropriately on room air.  No tension physiology noted on CTA.  Tube thoracostomy deferred per patient's family - serial chest x-ray - azithromycin   - check COVID/flu/RSV - PRN antitussive, duonebs     UTI -Rocephin  pending urine culture  History of  PE  - chronic thrombosis seen on CTA.  Continue Eliquis   HTN Sinus bradycardia  - bp labile - hold metoprolol for now, monitor on telemetry - PRN hydralazine    Generalized weakness/failure to thrive - Nutritionist consult appreciated - PT/OT/SLP evaluation  recommendations appreciated  CKD 3 - Stable at baseline.  Continue to monitor  CAD Aortic  arch abnormalities  - incidental findings on CTA. F/u PCP if patient/family wish to pursue further evaluation   On eliquis   LR@75  Dysphagia diet  Monitor/replace electrolytes   Advance Care Planning:   Code Status: Prior discussed with patient's granddaughter to have admission   Family Communication: Patient's granddaughter present at bedside  Severity of Illness: The appropriate patient status for this patient is INPATIENT. Inpatient status is judged to be reasonable and necessary in order to provide the required intensity of service to ensure the patient's safety. The patient's presenting symptoms, physical exam findings, and initial radiographic and laboratory data in the context of their chronic comorbidities is felt to place them at high risk for further clinical deterioration. Furthermore, it is not anticipated that the patient will be medically stable for discharge from the hospital within 2 midnights of admission.   * I certify that at the point of admission it is my clinical judgment that the patient will require inpatient hospital care spanning beyond 2 midnights from the point of  admission due to high intensity of service, high risk for further deterioration and high frequency of surveillance required.*  Author: Daved [name] Pump, DO 07/19/2024 9:59 PM  For on call review [email or web address].

## 2024-07-19 NOTE — ED Triage Notes (Signed)
 See first nurse note.

## 2024-07-19 NOTE — ED Triage Notes (Signed)
 Pt comes via EMs from home with c/o sob. Pt states no complaints. Family called and said breathing was labored and wanted her checked out .   VSS 98% RA  lungs clear

## 2024-07-19 NOTE — ED Notes (Signed)
In and out catheter performed to obtain urine sample.

## 2024-07-20 ENCOUNTER — Inpatient Hospital Stay

## 2024-07-20 DIAGNOSIS — J939 Pneumothorax, unspecified: Secondary | ICD-10-CM

## 2024-07-20 LAB — CBC
HCT: 28.1 % — ABNORMAL LOW (ref 36.0–46.0)
Hemoglobin: 8.3 g/dL — ABNORMAL LOW (ref 12.0–15.0)
MCH: 29.6 pg (ref 26.0–34.0)
MCHC: 29.5 g/dL — ABNORMAL LOW (ref 30.0–36.0)
MCV: 100.4 fL — ABNORMAL HIGH (ref 80.0–100.0)
Platelets: 202 K/uL (ref 150–400)
RBC: 2.8 MIL/uL — ABNORMAL LOW (ref 3.87–5.11)
RDW: 16.1 % — ABNORMAL HIGH (ref 11.5–15.5)
WBC: 7.5 K/uL (ref 4.0–10.5)
nRBC: 0 % (ref 0.0–0.2)

## 2024-07-20 LAB — RESP PANEL BY RT-PCR (RSV, FLU A&B, COVID)  RVPGX2
Influenza A by PCR: NEGATIVE
Influenza B by PCR: NEGATIVE
Resp Syncytial Virus by PCR: NEGATIVE
SARS Coronavirus 2 by RT PCR: NEGATIVE

## 2024-07-20 LAB — BASIC METABOLIC PANEL WITH GFR
Anion gap: 5 (ref 5–15)
BUN: 32 mg/dL — ABNORMAL HIGH (ref 8–23)
CO2: 21 mmol/L — ABNORMAL LOW (ref 22–32)
Calcium: 8 mg/dL — ABNORMAL LOW (ref 8.9–10.3)
Chloride: 113 mmol/L — ABNORMAL HIGH (ref 98–111)
Creatinine, Ser: 1.39 mg/dL — ABNORMAL HIGH (ref 0.44–1.00)
GFR, Estimated: 34 mL/min — ABNORMAL LOW (ref >60)
Glucose, Bld: 84 mg/dL (ref 70–99)
Potassium: 4.2 mmol/L (ref 3.5–5.1)
Sodium: 139 mmol/L (ref 135–145)

## 2024-07-20 MED ORDER — BOOST PLUS PO LIQD
237.0000 mL | Freq: Three times a day (TID) | ORAL | Status: DC
  Administered 2024-07-21 – 2024-07-23 (×8): 237 mL via ORAL

## 2024-07-20 NOTE — Progress Notes (Signed)
 PT Cancellation Note  Patient Details Name: Kathy Villanueva MRN: [id number] DOB: [birth year]   Cancelled Treatment:    Reason Eval/Treat Not Completed: PT screened, no needs identified, will sign off. Per OT, pt has 24/7 support at home and is a max squat pivot with family. Currently functioning at her baseline performing squat pivot with OT in session. Reports no acute PT needs. PT to sign off in house.    Dorina HERO. Fairly IV, PT, DPT Physical Therapist- [facility] 07/20/2024, 9:22 AM

## 2024-07-20 NOTE — ED Notes (Signed)
 Pt daughter [name] [phone]  updated

## 2024-07-20 NOTE — Progress Notes (Signed)
 PROGRESS NOTE    Kathy Villanueva  FMW:[id number] DOB: Mar 24, 1924 DOA: 07/19/2024 PCP: Rudolpho Norleen BIRCH, MD  Chief Complaint  Patient presents with   Shortness of Breath    Hospital Course:  Kathy Villanueva is a 89 year old with advanced Alzheimer's dementia, decreasing appetite, hypertension, pulmonary embolism on Eliquis , who presents with shortness of breath and foul-smelling urine.  On arrival she was found to have UTI and hydropneumothorax.  Family is refusing chest tube at this time.  She was admitted and started on antibiotics and oxygen.  Respiratory status has continued to decline throughout the day of 8/12.  On multiple discussions with various family members they continue to endorse a desire for full code but they are considering hospice.  Subjective: Patient is weak and fatigued.  She does not answer questions consistently.  Requires significant prompting.   Objective: Vitals:   07/20/24 0543 07/20/24 0817 07/20/24 1130 07/20/24 1223  BP:  (!) 166/80 (!) 150/87 (!) 184/83  Pulse: (!) 52 70 68 65  Resp: 18 14 (!) 25 20  Temp:  97.8 F (36.6 C)  (!) 97.5 F (36.4 C)  TempSrc:  Oral  Axillary  SpO2: 100% 95% 100% (!) 89%  Weight:      Height:        Intake/Output Summary (Last 24 hours) at 07/20/2024 1401 Last data filed at 07/20/2024 0859 Gross per 24 hour  Intake 996.25 ml  Output --  Net 996.25 ml   Filed Weights   07/19/24 1421  Weight: 36.7 kg    Examination: General exam: Very fatigued, cachectic, frail Respiratory system: Intermittent tachypnea.  Shallow respirations. Cardiovascular system: S1 & S2 heard, RRR.  Gastrointestinal system: Abdomen is nondistended, soft and nontender.  Neuro: Tired, disoriented.  Does not answer questions consistently. Extremities: Thin. Skin: No rashes, lesions  Assessment & Plan:  Principal Problem:   Pneumothorax Active Problems:   Pulmonary embolus, right (HCC)   HTN  (hypertension)   Hydropneumothorax Bronchitis Acute hypoxic respiratory failure. - CXR less remarkable and CTA.  No tension physiology seen on CTA - Tube thoracostomy refused by patient's family - Continue with serial CXRs - Respiratory status continues to decline.  Currently on nonrebreather - Continue with antibiotics for now - COVID/flu/RSV: Negative - Continue with as needed DuoNebs and antitussives  UTI - Ceftriaxone  for now - Continue to follow urine culture  History of pulmonary embolism Chronic thrombosis seen on CTA.  Patient was on Eliquis  prior to arrival.  Will continue  Hypertension Sinus bradycardia - Blood pressures have been very labile since admission.  Will monitor closely on telemetry - Hold metoprolol for now - As needed hydralazine   Generalized weakness Failure to thrive Severe protein calorie malnutrition Advanced Alzheimer's dementia - Neurology consulted - PT/OT/SLP.   CKD stage IIIb - At baseline.  Continue to monitor  CAD Aortic arch abnormality - CCTA for more specific read.  Outpatient follow-up with PCP as desired.  Anticipate patient will not discharge from the hospital  Macrocytic anemia - Hemoglobin is down trended from 11.7-8.3.  No obvious blood loss.  Some of this may be hemodilutional.  Suspect initial CBC was dehydrated.  Patient has been on IV fluids.  Will continue to monitor this closely. -- baseline Hemoglobin appears to be around 9  Very poor prognosis - Patient is frail, cachectic, has worsening respiratory failure, advanced Alzheimer's dementia, progressive weight loss outpatient, and advanced age at 89 years old.  I anticipate patient is nearing end-of-life.  I have had  this discussion directly with her daughters separately today.  They both endorse understanding but at this time report they need more discussion as a family before deciding on their goals.  I have advised them that hospice would be the most dignified route  given patient's current severity of hypoxia and multiple other declining conditions.  Patient has effectively no reserve.  They endorsed understanding.  Palliative care has also been consulted to assist in GOC conversations.  For now we will continue with nonrebreather, antibiotics, and close following.  I have discussed CODE STATUS with multiple family members and they endorse a desire for full code.  DVT prophylaxis: Eliquis    Code Status: Full Code Disposition:  pending GOC conversation  Consultants:    Procedures:    Antimicrobials:  Anti-infectives (From admission, onward)    Start     Dose/Rate Route Frequency Ordered Stop   07/19/24 2345  cefTRIAXone  (ROCEPHIN ) 1 g in sodium chloride  0.9 % 100 mL IVPB        1 g 200 mL/hr over 30 Minutes Intravenous Every 24 hours 07/19/24 2332     07/19/24 2345  azithromycin  (ZITHROMAX ) 500 mg in sodium chloride  0.9 % 250 mL IVPB        500 mg 250 mL/hr over 60 Minutes Intravenous Every 24 hours 07/19/24 2332     07/19/24 2300  cefTRIAXone  (ROCEPHIN ) 1 g in sodium chloride  0.9 % 100 mL IVPB  Status:  Discontinued        1 g 200 mL/hr over 30 Minutes Intravenous  Once 07/19/24 2247 07/19/24 2334       Data Reviewed: I have personally reviewed following labs and imaging studies CBC: Recent Labs  Lab 07/19/24 1715 07/20/24 0510  WBC 9.0 7.5  HGB 11.7* 8.3*  HCT 39.4 28.1*  MCV 100.3* 100.4*  PLT 222 202   Basic Metabolic Panel: Recent Labs  Lab 07/19/24 1502 07/19/24 1715 07/20/24 0510  NA 142  --  139  K 4.6  --  4.2  CL 111  --  113*  CO2 20*  --  21*  GLUCOSE 111*  --  84  BUN 30*  --  32*  CREATININE 1.31*  --  1.39*  CALCIUM 9.0  --  8.0*  MG  --  1.9  --    GFR: Estimated Creatinine Clearance: 13.1 mL/min (A) (by C-G formula based on SCr of 1.39 mg/dL (H)). Liver Function Tests: Recent Labs  Lab 07/19/24 1715  Villanueva 24  ALT 13  ALKPHOS 43  BILITOT 0.7  PROT 7.8  ALBUMIN  3.9   CBG: No results for  input(s): GLUCAP in the last 168 hours.  Recent Results (from the past 240 hours)  Resp panel by RT-PCR (RSV, Flu A&B, Covid) Anterior Nasal Swab     Status: None   Collection Time: 07/20/24 12:47 AM   Specimen: Anterior Nasal Swab  Result Value Ref Range Status   SARS Coronavirus 2 by RT PCR NEGATIVE NEGATIVE Final    Comment: (NOTE) SARS-CoV-2 target nucleic acids are NOT DETECTED.  The SARS-CoV-2 RNA is generally detectable in upper respiratory specimens during the acute phase of infection. The lowest concentration of SARS-CoV-2 viral copies this assay can detect is 138 copies/mL. A negative result does not preclude SARS-Cov-2 infection and should not be used as the sole basis for treatment or other patient management decisions. A negative result may occur with  improper specimen collection/handling, submission of specimen other than nasopharyngeal swab, presence of viral mutation(s)  within the areas targeted by this assay, and inadequate number of viral copies(<138 copies/mL). A negative result must be combined with clinical observations, patient history, and epidemiological information. The expected result is Negative.  Fact Sheet for Patients:  BloggerCourse.com  Fact Sheet for Healthcare Providers:  SeriousBroker.it  This test is no t yet approved or cleared by the [place]  FDA and  has been authorized for detection and/or diagnosis of SARS-CoV-2 by FDA under an Emergency Use Authorization (EUA). This EUA will remain  in effect (meaning this test can be used) for the duration of the COVID-19 declaration under Section 564(b)(1) of the Act, 21 U.S.C.section 360bbb-3(b)(1), unless the authorization is terminated  or revoked sooner.       Influenza A by PCR NEGATIVE NEGATIVE Final   Influenza B by PCR NEGATIVE NEGATIVE Final    Comment: (NOTE) The Xpert Xpress SARS-CoV-2/FLU/RSV plus assay is intended as an aid in  the diagnosis of influenza from Nasopharyngeal swab specimens and should not be used as a sole basis for treatment. Nasal washings and aspirates are unacceptable for Xpert Xpress SARS-CoV-2/FLU/RSV testing.  Fact Sheet for Patients: BloggerCourse.com  Fact Sheet for Healthcare Providers: SeriousBroker.it  This test is not yet approved or cleared by the [place]  FDA and has been authorized for detection and/or diagnosis of SARS-CoV-2 by FDA under an Emergency Use Authorization (EUA). This EUA will remain in effect (meaning this test can be used) for the duration of the COVID-19 declaration under Section 564(b)(1) of the Act, 21 U.S.C. section 360bbb-3(b)(1), unless the authorization is terminated or revoked.     Resp Syncytial Virus by PCR NEGATIVE NEGATIVE Final    Comment: (NOTE) Fact Sheet for Patients: BloggerCourse.com  Fact Sheet for Healthcare Providers: SeriousBroker.it  This test is not yet approved or cleared by the [place]  FDA and has been authorized for detection and/or diagnosis of SARS-CoV-2 by FDA under an Emergency Use Authorization (EUA). This EUA will remain in effect (meaning this test can be used) for the duration of the COVID-19 declaration under Section 564(b)(1) of the Act, 21 U.S.C. section 360bbb-3(b)(1), unless the authorization is terminated or revoked.  Performed at [facility], [address], [place], [place] [address]      Radiology Studies: [facility] Chest [place] 1 View Result Date: 07/20/2024 CLINICAL DATA:  Hydropneumothorax. EXAM: PORTABLE CHEST 1 VIEW COMPARISON:  July 19, 2024. FINDINGS: Stable cardiomediastinal silhouette. Right lung is clear. Stable mild left basilar opacity concerning for atelectasis or infiltrate with small left pleural effusion. Bony thorax is unremarkable. No definite pneumothorax. IMPRESSION: Stable left  basilar opacity as noted above. Electronically Signed   By: Lynwood Landy Raddle M.D.   On: 07/20/2024 10:40   CT Angio Chest PE W and/or Wo Contrast Addendum Date: 07/19/2024 ADDENDUM REPORT: 07/19/2024 22:25 ADDENDUM: These results were called by telephone at the time of interpretation on 07/19/2024 at 9:58 Pm to provider NEHA RAY , who verbally acknowledged these results. Electronically Signed   By: Dorethia Molt M.D.   On: 07/19/2024 22:25   Result Date: 07/19/2024 CLINICAL DATA:  High probability pulmonary embolism, dyspnea EXAM: CT ANGIOGRAPHY CHEST WITH CONTRAST TECHNIQUE: Multidetector CT imaging of the chest was performed using the standard protocol during bolus administration of intravenous contrast. Multiplanar CT image reconstructions and MIPs were obtained to evaluate the vascular anatomy. RADIATION DOSE REDUCTION: This exam was performed according to the departmental dose-optimization program which includes automated exposure control, adjustment of the mA and/or kV according to patient  size and/or use of iterative reconstruction technique. CONTRAST:  60mL OMNIPAQUE  IOHEXOL  350 MG/ML SOLN COMPARISON:  03/15/2008 FINDINGS: Cardiovascular: There is adequate opacification of the pulmonary arterial tree. The central pulmonary arteries are enlarged in keeping with changes of pulmonary arterial hypertension. There is evidence of remote pulmonary embolism with chronic thrombosis of the right middle lobe are and segmental pulmonary arteries (85/4). No intraluminal filling defect is identified to suggest acute pulmonary embolism. Extensive multi-vessel coronary artery calcification. Global cardiac size is within normal limits. The descending thoracic aorta demonstrates fusiform dilation measuring 4.0 cm in [place] proximally and 3.0 cm in diameter distally just above the diaphragmatic hiatus. The ascending aorta is of normal caliber. This appears progressive since prior examination where this measured 3.7 cm in  maximal diameter. Additionally, there is a focal disruption of mural calcification and soft tissue excrescence involving the aortic arch at 52/4 measuring 15 mm in diameter and 8 mm in depth suspicious for a penetrating atherosclerotic ulcer. This is not well evaluated on this examination due to bolus timing. Mediastinum/Nodes: Visualized thyroid  is atrophic. No pathologic thoracic adenopathy. Moderate mass effect upon the distal esophagus by the crossing aorta without evidence of obstruction. Lungs/Pleura: Moderate left hydropneumothorax is present with a small basilar dependent fluid component and small to moderate gaseous component. No mediastinal shift or hyperexpansion of the left hemithorax to suggest tension physiology. Mild emphysema. No pneumothorax on the right. No pleural effusion on the right. Bronchial wall thickening noted in keeping with airway inflammation. Bronchomalacia involving the right mainstem bronchus and bronchus intermedius. Upper Abdomen: 12 mm nonobstructing calculus within the visualized right renal hilum. No acute abnormality. Musculoskeletal: No chest wall abnormality. No acute or significant osseous findings. Review of the MIP images confirms the above findings. IMPRESSION: 1. No evidence of acute pulmonary embolism. 2. Moderate left hydropneumothorax. No mediastinal shift or hyperexpansion of the left hemithorax to suggest tension physiology. 3. Fusiform dilation of the descending thoracic aorta measuring up to 4.0 cm in diameter, progressive since prior examination. If clinically indicated given the patient's medical comorbidities, recommend semi-annual imaging followup by CTA or MRA and referral to cardiothoracic surgery if not already obtained. This recommendation follows 2010 ACCF/AHA/AATS/ACR/ASA/SCA/SCAI/SIR/STS/SVM Guidelines for the Diagnosis and Management of Patients With Thoracic Aortic Disease. Circulation. 2010; 121: Z733-z63. Aortic aneurysm NOS (ICD10-I71.9) 4. Focal  disruption of mural calcification and soft tissue excrescence involving the aortic arch measuring 15 mm in diameter and 8 mm in depth suspicious for a penetrating atherosclerotic ulcer. This is not well evaluated on this examination due to bolus timing. Dedicated nonemergent CT arteriography of the thoracic aorta would be helpful for further evaluation if clinically indicated. 5. Extensive multi-vessel coronary artery calcification. 6. Mild emphysema. 7. Bronchial wall thickening in keeping with airway inflammation. Bronchomalacia involving the right mainstem bronchus and bronchus intermedius. 8. 12 mm nonobstructing calculus within the visualized right renal hilum. Aortic Atherosclerosis (ICD10-I70.0) and Emphysema (ICD10-J43.9). Electronically Signed: By: Dorethia Molt M.D. On: 07/19/2024 21:52   DG Chest Port 1 View Result Date: 07/19/2024 EXAM: 1 VIEW XRAY OF THE CHEST 07/19/2024 03:17:00 PM COMPARISON: 12/03/2022 CLINICAL HISTORY: Labored breathing FINDINGS: LUNGS AND PLEURA: Hyperinflation. Clearing of right lower lobe airspace disease. Patchy left basilar airspace disease is new. Small left pleural effusion. Favor a skin fold over the superolateral left hemithorax. HEART AND MEDIASTINUM: Normal heart size. Tortuous thoracic aorta. BONES AND SOFT TISSUES: No acute osseous abnormality. IMPRESSION: 1. New patchy left basilar airspace disease, favoring pneumonia or aspiration . 2. Small  left pleural effusion. 3. Probable skin fold over the superolateral left hemithorax. If high clinical concern of tiny pneumothorax, consider PA radiograph. Electronically signed by: Rockey Kilts MD 07/19/2024 04:23 PM EDT RP Workstation: [id number]    Scheduled Meds:  apixaban   2.5 mg Oral BID   lactose free nutrition  237 mL Oral TID WC   multivitamin with minerals  1 tablet Oral Daily   pantoprazole   40 mg Oral Daily   polyethylene glycol  17 g Oral Daily   Continuous Infusions:  azithromycin  Stopped (07/20/24  0256)   cefTRIAXone  (ROCEPHIN )  IV Stopped (07/20/24 0048)   lactated ringers  Stopped (07/20/24 1206)     LOS: 1 day  MDM: Patient is high risk for one or more organ failure.  They necessitate ongoing hospitalization for continued IV therapies and subsequent lab monitoring. Total time spent interpreting labs and vitals, reviewing the medical record, coordinating care amongst consultants and care team members, directly assessing and discussing care with the patient and/or family: 55 min  Kathy Toste, DO [facility]  To contact the attending physician between 7A-7P please use Epic Chat. To contact the covering physician during after hours 7P-7A, please review Amion.  07/20/2024, 2:01 PM   *This document has been created with the assistance of dictation software. Please excuse typographical errors. *

## 2024-07-20 NOTE — Progress Notes (Signed)
 Initial Nutrition Assessment  DOCUMENTATION CODES:   Underweight, Severe malnutrition in context of chronic illness  INTERVENTION:   -D/c Ensure Plus High Protein po BID, each supplement provides 350 kcal and 20 grams of protein  -Boost Plus TID, each supplement provides 360 kcals and 14 grams protein -MVI with minerals daily -Feeding assistance with meals -Continue dysphagia 2 diet; RD will follow for potential advancement  NUTRITION DIAGNOSIS:   Severe Malnutrition related to chronic illness (dementia) as evidenced by severe fat depletion, severe muscle depletion, percent weight loss.  GOAL:   Patient will meet greater than or equal to 90% of their needs  MONITOR:   PO intake, Supplement acceptance, Diet advancement  REASON FOR ASSESSMENT:   Consult Assessment of nutrition requirement/status  ASSESSMENT:   Pt with advanced Alzheimer's dementia, decreasing appetite, hypertension, pulmonary embolism on Eliquis , who presents with shortness of breath and foul-smelling urine.  Pt admitted with hydropneumothorax, bronchitis, and UTI.   8/11- dysphagia 1 diet with thin liquids 8/12- s/p BSE- dysphagia 2 diet with thin liquids  Reviewed I/O's: +350 ml x 24 hours  Case discussed with SLP, who reports concern over poor oral intake and potential malnutrition. Pt has been placed on a dysphagia 2 diet per SLP evaluation.   Pt just arrived to unit at time of visit and receiving nursing care at time of visit.   RD spoke with pt daughter at bedside. Daughter shares that pt lives at home and has 24 hour care with the help of paid caregivers and family members.   Per daughter, pt eats 3 meals per day, but chronically does not eat much. She shares intake started declining late last week, which she suspects lead to dehydration and hospitalization. Pt consumed a regular diet PTA; she is able to feed herself, but needs encouragement from to do so. Pt does not drink much, especially water.  Intake has declined recently secondary to sore throat and outpatient had been ordered at recent PCP visit. Per SLP, pt is not able to feed herself in her current mental status.   Pt has been trialed on Ensure in the past, but it runs right through her. Pt tolerates Boost supplements well and family is amenable to chocolate flavor.   Reviewed wt hx; pt has experienced a 12.8% wt loss over the past 6 months, which is significant for time frame.   Pt with malnutrition, likely as a result of her advanced dementia. RD would not recommend alternative means of nutrition/ hydration secondary to advanced age/ dementia as this would not enhance pt's quality of life.   Medications reviewed and include miralax  and protonix .   Palliative care following for goals of care discussions.   Labs reviewed: CBGS: 96 (inpatient orders for glycemic control are none).    NUTRITION - FOCUSED PHYSICAL EXAM:  Flowsheet Row Most Recent Value  Orbital Region Severe depletion  Upper Arm Region Severe depletion  Thoracic and Lumbar Region Severe depletion  Buccal Region Severe depletion  Temple Region Severe depletion  Clavicle Bone Region Severe depletion  Clavicle and Acromion Bone Region Severe depletion  Scapular Bone Region Severe depletion  Dorsal Hand Severe depletion  Patellar Region Severe depletion  Anterior Thigh Region Severe depletion  Posterior Calf Region Severe depletion  Edema (RD Assessment) None  Hair Reviewed  Eyes Reviewed  Mouth Reviewed  Skin Reviewed  Nails Reviewed    Diet Order:   Diet Order             DIET  DYS 2 Room service appropriate? Yes with Assist; Fluid consistency: Thin  Diet effective now                   EDUCATION NEEDS:   Education needs have been addressed  Skin:  Skin Assessment: Reviewed RN Assessment  Last BM:  Unknown  Height:   Ht Readings from Last 1 Encounters:  07/19/24 5' 4 (1.626 m)    Weight:   Wt Readings from Last 1  Encounters:  07/19/24 36.7 kg    Ideal Body Weight:  54.5 kg  BMI:  Body mass index is 13.9 kg/m.  Estimated Nutritional Needs:   Kcal:  1450-1650  Protein:  60-75 grams  Fluid:  1.4-1.6 L    Margery ORN, RD, LDN, CDCES Registered Dietitian III Certified Diabetes Care and Education Specialist If unable to reach this RD, please use RD Inpatient group chat on secure chat between hours of 8am-4 pm daily

## 2024-07-20 NOTE — Progress Notes (Signed)
   07/20/24 1530  Spiritual Encounters  Type of Visit Initial  Care provided to: Family  Conversation partners present during encounter Nurse  Referral source Chaplain assessment (Chaplain saw a family member crying the hallway and asked if there was anything she could do and family member denied needing anything)  Reason for visit Routine spiritual support  Interventions  Spiritual Care Interventions Made Established relationship of care and support   Family needing to make decisions about pt's care and if they want to bring in hospice. Chaplain let them know we are available 24/7

## 2024-07-20 NOTE — Evaluation (Addendum)
 Clinical/Bedside Swallow Evaluation Patient Details  Name: Kathy Villanueva MRN: [id number] Date of Birth: 08-17-1924  Today's Date: 07/20/2024 Time: SLP Start Time (ACUTE ONLY): 1045 SLP Stop Time (ACUTE ONLY): 1145 SLP Time Calculation (min) (ACUTE ONLY): 60 min  Past Medical History:  Past Medical History:  Diagnosis Date   Arthritis    hand   Chronic systolic CHF (congestive heart failure) (HCC)    GERD (gastroesophageal reflux disease)    Gout    hand   Gross hematuria    Hematoma of kidney    History of kidney stones    HTN (hypertension)    controlled on meds   Hydronephrosis    Hypercholesteremia    Kidney stones    Nausea    Transfusion history    Ureteral stone    Past Surgical History:  Past Surgical History:  Procedure Laterality Date   ABDOMINAL HYSTERECTOMY     CATARACT EXTRACTION W/PHACO Left 10/20/2019   Procedure: CATARACT EXTRACTION PHACO AND INTRAOCULAR LENS PLACEMENT (IOC) LEFT MALYUGIN 01:25.9,  13.6%, 11.71;  Surgeon: Mittie Gaskin, MD;  Location: [facility];  Service: Ophthalmology;  Laterality: Left;   COLONOSCOPY     KIDNEY STONE SURGERY     LAPAROTOMY N/A 10/04/2021   Procedure: EXPLORATORY LAPAROTOMY;  Surgeon: Rodolph Romano, MD;  Location: [facility];  Service: General;  Laterality: N/A;   LYSIS OF ADHESION N/A 10/04/2021   Procedure: LYSIS OF ADHESION;  Surgeon: Rodolph Romano, MD;  Location: [facility];  Service: General;  Laterality: N/A;   HPI:  Pt is a 89 y.o. female with medical history significant of Dementia per chart, hypertension, CKD 3, PE on Eliquis , presents to the emergency department for evaluation of worsening shortness of breath for the past week or so.  History is per granddaughter present at bedside.  Granddaughter states that the patient has become short of breath with minimal activity and reported new left-sided chest pain and foul smelling urine today.  She does have occasional cough and watery  diarrhea for the past 2 days.  Family has declined a chest tube per chart.  Pt was hypothermic and tachypneic at admit in the ED.  CTA was  negative for acute pulmonary embolism, however significant for a  moderate size left hydropneumothorax.  Pt is a Full Code.  OF NOTE: Pt was seen for cough and pharyngitis in 06/2024. Pt's BMI is 13.9.    Assessment / Plan / Recommendation  Clinical Impression   Pt seen for BSE this morning; Family present in room. Pt lying in bed; engaged minimally and required MOD+ cues. Family present. Noted increased WOB w/ any exertion(clavicular), including moving about in bed. Pt appeared cachectic. Pt appeared to present w/ Cognitive decline -- there is Dementia listed in her chart notes. Afebrile. On [place] O2 3L. WBC WNL.  Pt appears to present w/ adequate pharyngeal phase swallowing w/ oral phase dysphagia present c/b min lack of bolus awareness and min lengthy oral phase time for full bolus management/clearing w/ increased texture. No overt neuromuscular deficits noted but suspect impact from Advance Age and timing of bolus management as well as Cognitive decline and hospitalization/illness. Pt is also Edentulous. Pt consumed po trials w/ No immediate, overt clinical s/s of aspiration during po trials- few hard swallows noted. Pt appears at min risk for aspiration/aspiration pneumonia but this risk appears reduced when following general aspiration precautions w/ a modified diet. Pt does have challenging factors that could impact her oropharyngeal swallowing to include fatigue/weakness/deconditioning, advanced  age, Edentulous status, and need for feeding support as well as current illness. These factors can increase risk for aspiration, dysphagia as well as decreased oral intake overall.   During po trials given, pt consumed consistencies w/ no overt coughing, decline in vocal quality, or change in respiratory presentation during/post trials. O2 sats remained at their baseline,  fluctuating. Oral phase appeared grossly [facility] w/ timely bolus management and control of bolus propulsion for A-P transfer for swallowing -- pt mashed/gummed the broken down food trials(moistened well). Oral clearing achieved w/ trial consistencies. She quickly DECLINED further po trials despite Encouragement.  OM Exam was cursory but appeared [facility] w/ no unilateral weakness noted. Speech intelligible but muttered/mumbled and Edentulous. Pt could Not hold cup to help feed self even w/ support -- PINCHED Straw was used when pt drank thin liquids to monitor bolus volume.   Recommend a MINCED foods consistency diet w/ well-moistened foods; Thin liquids -- carefully monitor straw use, and pt should help to Hold Cup when drinking. Added purees. Gravies to moisten foods for ease of gumming/mashing. Recommend general aspiration precautions. Full feeding support and Supervision at meals. REFLUX Precautions. Small bites/sips Slowly. Pills CRUSHED in Puree for safer, easier swallowing -- it was encouraged now and for D/C to the Dtr/Son/Family in room.   Education given on Pills in Puree; food consistencies and easy to eat options; general aspiration precautions to pt and Family. Encouraged discussion of GOC overall as pt DECLINED po intake w/ this SLP and required much encouragement. ST services can be available if new needs arise during admit. Recommend this diet consistency for Discharge to next venue of care; ST f/u then if needs indicate.  MD/NSG updated, agreed. Palliative Care order obtained. Recommend Dietician f/u for support. Precautions posted in chart, room.  SLP Visit Diagnosis: Dysphagia, oropharyngeal phase (R13.12) (in setting of Dementia; advanced age(89); feeding dependency; deconditioning/malnutrition; Belching w/ po intake)    Aspiration Risk  Mild aspiration risk;Risk for inadequate nutrition/hydration    Diet Recommendation   Thin;Dysphagia 2 (chopped) (some purees) = added purees. Gravies to  moisten foods for ease of gumming/mashing. General aspiration precautions. Full feeding support and Supervision at meals. REFLUX Precautions. Small bites/sips Slowly.   Medication Administration: Crushed with puree    Other  Recommendations Recommended Consults:  (Palliative Care consult; Dietician) Oral Care Recommendations: Oral care BID;Staff/trained caregiver to provide oral care     Assistance Recommended at Discharge  FULL  Functional Status Assessment Patient has had a recent decline in their functional status and/or demonstrates limited ability to make significant improvements in function in a reasonable and predictable amount of time  Frequency and Duration  (n/a)   (n/a)       Prognosis Prognosis for improved oropharyngeal function: Guarded (-Fair) Barriers to Reach Goals: Cognitive deficits;Language deficits;Time post onset;Severity of deficits;Behavior Barriers/Prognosis Comment: Dementia; advanced age(89); feeding dependency; deconditioning/malnutrition; Belching w/ po intake      Swallow Study   General Date of Onset: 07/19/24 HPI: Pt is a 89 y.o. female with medical history significant of Dementia per chart, hypertension, CKD 3, PE on Eliquis , presents to the emergency department for evaluation of worsening shortness of breath for the past week or so.  History is per granddaughter present at bedside.  Granddaughter states that the patient has become short of breath with minimal activity and reported new left-sided chest pain and foul smelling urine today.  She does have occasional cough and watery diarrhea for the past 2 days.  Family has  declined a chest tube per chart.  Pt was hypothermic and tachypneic at admit in the ED.  CTA was  negative for acute pulmonary embolism, however significant for a  moderate size left hydropneumothorax.  Pt is a Full Code.  OF NOTE: Pt was seen for cough and pharyngitis in 06/2024. Pt's BMI is 13.9. Type of Study: Bedside Swallow  Evaluation Previous Swallow Assessment: none Diet Prior to this Study: Dysphagia 1 (pureed);Thin liquids (Level 0) Temperature Spikes Noted: No (wbc 7.5) Respiratory Status: Nasal cannula (3L) History of Recent Intubation: No Behavior/Cognition: Alert;Cooperative;Pleasant mood;Confused;Distractible;Requires cueing;Doesn't follow directions Oral Cavity Assessment: Dry Oral Care Completed by SLP: Yes Oral Cavity - Dentition: Missing dentition Vision:  (n/a) Self-Feeding Abilities: Total assist Patient Positioning: Upright in bed (full assist) Baseline Vocal Quality: Low vocal intensity (muttered) Volitional Cough: Cognitively unable to elicit Volitional Swallow: Unable to elicit    Oral/Motor/Sensory Function Overall Oral Motor/Sensory Function:  (no unilateral weakness noted w/ oral intake and clearing)   Ice Chips Ice chips: Impaired (oral prep) Presentation: Spoon (fed; 2 trials) Oral Phase Impairments: Poor awareness of bolus Pharyngeal Phase Impairments:  (no overt)   Thin Liquid Thin Liquid: Impaired Presentation: Cup;Straw (supported pt holding the cup and used Pinched straw to limit bolus volume; 8 trials accepted) Oral Phase Impairments: Poor awareness of bolus Pharyngeal  Phase Impairments:  (hard swallows at times; no overt coughing)    Nectar Thick Nectar Thick Liquid: Not tested   Honey Thick Honey Thick Liquid: Not tested   Puree Puree: Within functional limits (grossly) Presentation: Spoon (fed; 4 trials accepted)   Solid     Solid: Impaired Presentation: Spoon (2 trials accepted) Oral Phase Impairments: Poor awareness of bolus (min) Oral Phase Functional Implications:  (min increased time needed) Pharyngeal Phase Impairments:  (no overt) Other Comments: lacks Dentition for effective mastication         Comer Portugal, MS, CCC-SLP Speech Language Pathologist Rehab Services; [facility] [place] [phone] (ascom) Cartha Rotert 07/20/2024,5:35  PM

## 2024-07-20 NOTE — ED Notes (Signed)
 Granddaughter [name] updated.

## 2024-07-20 NOTE — ED Notes (Signed)
 Pt had diarrhea episode. RN and NT performed pericare. Sacrum foam placed

## 2024-07-20 NOTE — Evaluation (Signed)
 Occupational Therapy Evaluation Patient Details Name: Kathy Villanueva MRN: [id number] DOB: [birth year] Today's Date: 07/20/2024   History of Present Illness   Pt is a 89 y.o. female presents to the ED for management of moderate size left hydropneumothorax and possible bronchitis with chest tube declined by family and UTI. PMH of Hypertension, CKD 3, PE on Eliquis ,     Clinical Impressions Pt was seen for OT evaluation this date. Placed call to pt's granddaughter who assists in her care to gather all PLOF. Pt has 24/7 care at home via family and paid caregivers. She is able to feed herself and wash her face at baseline. Pt requires Max A for SPT to her W/C daily and is total assist for other ADLs.   Pt presents with only activity tolerance deficits d/t requirement for 1L 02. Pt was able to perform bed mobility and SPT to recliner and back to bed at her baseline level. 02 was removed for session and pt with drop to 89%, however quickly recovered to 98-100% with replacement of 1L. Spoke with granddaughter regarding any further DME or equipment needs at home and she declined at this time. Pt does not have any further acute OT needs as she is at her baseline function regarding ADL and transfer performance. No follow up OT indicated. Recommend nursing or mobility have pt get OOB to chair daily to maintain current and PLOF.      If plan is discharge home, recommend the following:   A lot of help with walking and/or transfers;A lot of help with bathing/dressing/bathroom     Functional Status Assessment   Patient has not had a recent decline in their functional status     Equipment Recommendations         Recommendations for Other Services         Precautions/Restrictions   Precautions Precautions: Fall Recall of Precautions/Restrictions: Impaired Restrictions Weight Bearing Restrictions Per Provider Order: No     Mobility Bed Mobility Overal bed mobility: Needs  Assistance Bed Mobility: Supine to Sit, Sit to Supine     Supine to sit: Mod assist, Max assist, HOB elevated Sit to supine: Max assist   General bed mobility comments: pt has assist at baseline for all bed mobility tasks from caregivers    Transfers Overall transfer level: Needs assistance   Transfers: Bed to chair/wheelchair/BSC     Squat pivot transfers: Max assist       General transfer comment: pt is a Max A for squat pivot at baseline and able to be performed at same level this date      Balance Overall balance assessment: Needs assistance Sitting-balance support: Feet supported Sitting balance-Leahy Scale: Fair Sitting balance - Comments: CGA/SBA seated EOB     Standing balance-Leahy Scale: Poor Standing balance comment: Max A for SPTat baseline                           ADL either performed or assessed with clinical judgement   ADL Overall ADL's : At baseline                                       General ADL Comments: pt is a total assist for bathing, dressing and toileting at baseline; she is typically able to feed herself and perform face washing     Vision  Perception         Praxis         Pertinent Vitals/Pain       Extremity/Trunk Assessment Upper Extremity Assessment Upper Extremity Assessment: Generalized weakness   Lower Extremity Assessment Lower Extremity Assessment: Generalized weakness       Communication Communication Communication: Impaired Factors Affecting Communication: Hearing impaired   Cognition Arousal: Alert Behavior During Therapy: WFL for tasks assessed/performed Cognition: Cognition impaired   Orientation impairments: Person                           Following commands: Impaired Following commands impaired: Follows one step commands with increased time     Cueing  General Comments      pt on 1L 02 on entry, placed on RA for session and dropped to 89%,  however once replaced 1L quickly improved back to upper 90's to 100%; VSS otherwise   Exercises     Shoulder Instructions      Home Living Family/patient expects to be discharged to:: Private residence Living Arrangements: Children (24/7 care via family and aides) Available Help at Discharge: Family;Available 24 hours/day;Personal care attendant Type of Home: Apartment Home Access: Level entry     Home Layout: One level               Home Equipment: Wheelchair - Forensic psychologist (2 wheels);Rollator (4 wheels)          Prior Functioning/Environment Prior Level of Function : Needs assist       Physical Assist : Mobility (physical);ADLs (physical) Mobility (physical): Transfers;Bed mobility ADLs (physical): Bathing;Dressing;Toileting;IADLs Mobility Comments: Max A SPT to W/C daily ADLs Comments: 24/7 care; gets up to W/C daily via SPT with Max A; Min A for rolling in bed at home, can bridge and scoot; Set up for grooming and self feeding; total for bathing/dressing; wears briefs    OT Problem List:     OT Treatment/Interventions:        OT Goals(Current goals can be found in the care plan section)       OT Frequency:       Co-evaluation              AM-PAC OT 6 Clicks Daily Activity     Outcome Measure Help from another person eating meals?: None Help from another person taking care of personal grooming?: A Little Help from another person toileting, which includes using toliet, bedpan, or urinal?: Total Help from another person bathing (including washing, rinsing, drying)?: Total Help from another person to put on and taking off regular upper body clothing?: Total Help from another person to put on and taking off regular lower body clothing?: Total 6 Click Score: 11   End of Session Equipment Utilized During Treatment: Gait belt Nurse Communication: Mobility status  Activity Tolerance: Patient tolerated treatment well Patient left: in  bed;with call bell/phone within reach;with bed alarm set  OT Visit Diagnosis: Other abnormalities of gait and mobility (R26.89)                Time: 9244-9177 OT Time Calculation (min): 27 min Charges:  OT General Charges $OT Visit: 1 Visit OT Evaluation $OT Eval Moderate Complexity: 1 Mod OT Treatments $Therapeutic Activity: 8-22 mins  Kathy Villanueva, OTR/L 07/20/24, 9:31 AM  Kathy Villanueva 07/20/2024, 9:26 AM

## 2024-07-20 NOTE — Progress Notes (Signed)
 Patient arrived to unit with soiled brief and bed linen. Peri care and bed bath given, all linens changed. Pt placed on tele box 11 and tele techs notified. Mepilex placed to sacrum, healed wound noted, no broken skin. Pt is alert to self and location. Pt given call bell, bed locked and low, pt at 30 degrees HOB, using accessory muscles to breathe. Notified Dr Leesa of above via epic chat. Daughter at bedside, provided update on vital signs.

## 2024-07-20 NOTE — Progress Notes (Signed)
 IV team consult noted for IV replacement.  Arrived at room with many family members, spoke with Almarie, RN, okay to wait to place PIV later as no meds are due at this time.  Almarie will notify IV team if IV access is needed prior to next scheduled IV medication dose.

## 2024-07-20 NOTE — Progress Notes (Addendum)
 Pt has multiple children and grandchildren at bedside. Family asking questions re: conversation they had with Dr Leesa about pt's current status and prognosis. They explained what they knew at this time which was that pt was very sick and not doing well. Family also asking about CPR and ventilators and treatment options currently. Reviewed MD notes with family and reviewed the purpose and mechanism of CPR and ventilators in plain language. Family tearful but verbalizes they understand and do not want that for the patient, but they need to make a decision as a family. Educated that palliative care was consulted and they would be in today or tomorrow to discuss options with family. Family again verbalizes understanding. Pt denies needs, is in NAD at this time, but is currently on a NRB and unable to come off it to eat. Pt will accept one or two bites of pudding but then refuses more food. Educated family that at this time if she does not eat it is ok and also that she needs the oxygen via the NRB more than to eat right now. Family verbalizes understanding at this time. Notified Dr Leesa via epic chat of above.

## 2024-07-21 DIAGNOSIS — J9311 Primary spontaneous pneumothorax: Secondary | ICD-10-CM

## 2024-07-21 DIAGNOSIS — Z515 Encounter for palliative care: Secondary | ICD-10-CM

## 2024-07-21 DIAGNOSIS — I2699 Other pulmonary embolism without acute cor pulmonale: Secondary | ICD-10-CM

## 2024-07-21 DIAGNOSIS — J939 Pneumothorax, unspecified: Secondary | ICD-10-CM | POA: Diagnosis not present

## 2024-07-21 MED ORDER — AZITHROMYCIN 250 MG PO TABS
500.0000 mg | ORAL_TABLET | Freq: Every day | ORAL | Status: DC
  Administered 2024-07-22 – 2024-07-23 (×2): 500 mg via ORAL
  Filled 2024-07-21 (×2): qty 2

## 2024-07-21 MED ORDER — CARVEDILOL 3.125 MG PO TABS
3.1250 mg | ORAL_TABLET | Freq: Two times a day (BID) | ORAL | Status: DC
  Administered 2024-07-22 – 2024-07-23 (×3): 3.125 mg via ORAL
  Filled 2024-07-21 (×3): qty 1

## 2024-07-21 NOTE — Hospital Course (Addendum)
 89yo with h/o advanced Alzheimer's dementia, HTN, PE on Eliquis , and anorexia who presented on 8/11 with SOB.  She was found to have UTI and hydropneumothorax; family refused vs. EDP did not recommend chest tube.  She was started on antibiotics and oxygen and is improving.  Patient changed to DNR, family is considering home hospice.

## 2024-07-21 NOTE — Plan of Care (Signed)
  Problem: Clinical Measurements: Goal: Ability to maintain clinical measurements within normal limits will improve Outcome: Progressing Goal: Will remain free from infection Outcome: Progressing   Problem: Coping: Goal: Level of anxiety will decrease Outcome: Progressing   Problem: Safety: Goal: Ability to remain free from injury will improve Outcome: Progressing   

## 2024-07-21 NOTE — Consult Note (Signed)
 Consultation Note Date: 07/21/2024 at 1015  Patient Name: Kathy Villanueva  DOB: 1924-07-04  MRN: [id number]  Age / Sex: 89 y.o., female  PCP: Kathy Norleen BIRCH, MD Referring Physician: Barbarann Nest, MD  HPI/Patient Profile: 89 y.o. female  with past medical history of advanced Alzheimer's dementia, decreasing appetite, hypertension, pulmonary embolism on Eliquis  admitted on 07/19/2024 with shortness of breath and foul-smelling urine.  Patient is being treated with antibiotics and supplemental oxygen.  PMT was consulted to explore patient's goals of care discussions.  Clinical Assessment and Goals of Care: Extensive chart review completed prior to meeting patient including labs, vital signs, imaging, progress notes, orders, and available advanced directive documents from current and previous encounters. I then met with patient, her daughter [name], her son [name], and her granddaughter [name] at bedside, to discuss diagnosis prognosis, GOC, EOL wishes, disposition and options.  I introduced Palliative Medicine as specialized medical care for people living with serious illness. It focuses on providing relief from the symptoms and stress of a serious illness. The goal is to improve quality of life for both the patient and the family.  We discussed a brief life review of the patient.   As far as functional and nutritional status family endorses that patient is offered food but oftentimes declines it.  She has not been out of the bed and able to walk in several years.  According to daughter [name], patient had the [place] Storts at 1 point to walk but declines to work with PT and.  Therefore, she became weak and unable to get out of the bed.  Family shares patient has a touch of Alzheimer's.  Discussed dementia as a chronic, progressive, and irreversible disease that is often exacerbated by acute illnesses and  hospitalizations.  Family shares that she knows who she is, she knows where she is, and that she does not have advanced dementia.  We discussed patient's current illness and what it means in the larger context of patient's on-going co-morbidities.  Reviewed her advanced age, Alzheimer's, poor p.o. intake and malnutrition, and risk for blood clots given history of pulmonary embolism.  I expressed my concern that continued treatment for acute issues, bringing patient back and forth to the hospital and doctors appointments, and continuing to give full scope of care will not reverse her Alzheimer's disease process.  I attempted to elicit values and goals of care important to the patient and family.  Family shares that hope is that patient clinically improves.  Family shares that patient has 24/7 care at home.  The patient does not express this, but family shares that patient wants to just be at home.  Advance directives, concepts specific to code status, artificial feeding and hydration, and rehospitalization were considered and discussed. Encouraged patient/family to consider DNR/DNI status understanding evidenced based poor outcomes in similar hospitalized patients, as the cause of the arrest is likely associated with chronic/terminal disease rather than a reversible acute cardio-pulmonary event.    Discussed patient's functional, nutritional, and cognitive status is significant indicators of  her overall prognosis.  I confirmed with family that patient has not created a healthcare power of attorney.  In the absence of a spouse, patient's next of kin falls to majority of patient's reasonably available adult children.   Granddaughter [name], son [name], and daughter [name] present at bedside were all in agreement that patient's CODE STATUS should be DNR with limited interventions.  Additionally, they endorse they would never want their mother to receive artificial tube feedings for hemodialysis if  needed.  After meeting with patient and family, I spoke with patient's other daughter [name].  She is also in agreement with DNR with limited interventions.  CODE STATUS changed in MAR.  Notified attending and RN of adjustment.  Education offered regarding concept specific to human mortality and the limitations of medical interventions to prolong life when the body begins to fail to thrive.   Discussed with patient/family the importance of continued conversation with family and the medical providers regarding overall plan of care and treatment options, ensuring decisions are within the context of the patient's values and GOCs.    Reviewed that if patient's wishes are to remain at home then hospice services can be considered.  Education on aging in place, comfort focused care, and hospice philosophy reviewed.  Difference between hospice and Palliative Care services outpatient were explained.  Family endorses patient is already being followed by outpatient palliative care and they wish to continue with this plan of care.  Primary Decision Maker NEXT OF KIN  Physical Exam Vitals reviewed.  Constitutional:      Comments: Thin, frail  HENT:     Head:     Comments: Temporal wasting Cardiovascular:     Rate and Rhythm: Normal rate.  Pulmonary:     Effort: Pulmonary effort is normal.     Comments: Marrero in place Musculoskeletal:     Comments: Generalized weakness  Skin:    General: Skin is warm and dry.  Neurological:     Mental Status: She is alert.     Comments: Oriented to self and place  Psychiatric:        Mood and Affect: Mood is not anxious.        Behavior: Behavior normal. Behavior is not agitated.     Palliative Assessment/Data: 30%     Thank you for this consult. Palliative medicine will continue to follow and assist holistically.   Time Total: 75 minutes  Time spent includes: Detailed review of medical records (labs, imaging, vital signs), medically appropriate exam  (mental status, respiratory, cardiac, skin), discussed with treatment team, counseling and educating patient, family and staff, documenting clinical information, medication management and coordination of care.  Signed by: Lamarr Gunner, DNP, FNP-BC Palliative Medicine   Please contact Palliative Medicine Team providers via [facility] for questions and concerns.

## 2024-07-21 NOTE — Progress Notes (Signed)
 Progress Note   Patient: Kathy Villanueva FMW:[id number] DOB: [birth year] DOA: 07/19/2024     2 DOS: the patient was seen and examined on 07/21/2024   Brief hospital course: 89yo with h/o advanced Alzheimer's dementia, HTN, PE on Eliquis , and anorexia who presented on 8/11 with SOB.  She was found to have UTI and hydropneumothorax; family refused vs. EDP did not recommend chest tube.  She was started on antibiotics and oxygen and is improving.  Patient changed to DNR, family is considering home hospice.  Assessment and Plan:  Hydropneumothorax with acute hypoxic respiratory failure CXR less remarkable than CTA.  No tension physiology seen on CTA Tube thoracostomy refused by patient's family Stable on serial CXRs Respiratory status improving with [place] O2 and antibiotics Continue with antibiotics for now COVID/flu/RSV: Negative Continue with as needed DuoNebs and antitussives   UTI Culture with >100k colonies E coli Continue Ceftriaxone  for now   History of pulmonary embolism Chronic thrombosis seen on CTA Patient was on Eliquis  prior to arrival; will continue   Hypertension/Sinus bradycardia Resume low-dose carvedilol  As needed IV hydralazine    Generalized weakness/FTT/Advanced Alzheimer's dementia PT/OT/SLP consulted She has an abundance of family who care for her along with caregivers 24/7 at home Family is considering addition of home hospice Patient is eager to go home when possible  Severe protein calorie malnutrition Nutrition Problem: Severe Malnutrition Etiology: chronic illness (dementia) Signs/Symptoms: severe fat depletion, severe muscle depletion, percent weight loss Interventions: Boost Plus, MVI, Magic cup   CKD stage IIIb Appears to be stable at this time Attempt to avoid nephrotoxic medications Recheck BMP in AM    CAD/Aortic arch abnormality/thoracic aneurysm See CTA for more specific read Outpatient follow-up with PCP as desired She is not a reasonable  candidate for repair   Macrocytic anemia Hemoglobin is down, trended from 11.7-8.3 No obvious blood loss Baseline Hemoglobin appears to be around 9 Recheck CBC in AM   Very poor prognosis Patient is frail, cachectic with advanced Alzheimer's dementia, progressive weight loss outpatient, and advanced age at 89 years old She appears to be nearing end-of-life Palliative care has also been consulted to assist in GOC conversations She is now DNR Family is considering home hospice    Consultants: Palliative care PT OT Nutrition  Procedures: None  Antibiotics: Azithromycin  8/11- Ceftriaxone  8/11-  30 Day Unplanned Readmission Risk Score    Flowsheet Row ED to Hosp-Admission (Current) from 07/19/2024 in [facility]  30 Day Unplanned Readmission Risk Score (%) 22.45 Filed at 07/21/2024 0801    This score is the patient's risk of an unplanned readmission within 30 days of being discharged (0 -100%). The score is based on dignosis, age, lab data, medications, orders, and past utilization.   Low:  0-14.9   Medium: 15-21.9   High: 22-29.9   Extreme: 30 and above           Subjective: Doing better today, answers questions appropriately and has a feisty mood (which is her baseline)   Objective: Vitals:   07/21/24 1400 07/21/24 1743  BP:  (!) 168/93  Pulse:    Resp: (!) 23 (!) 21  Temp:  98.4 F (36.9 C)  SpO2: 100% 100%    Intake/Output Summary (Last 24 hours) at 07/21/2024 1814 Last data filed at 07/21/2024 1426 Gross per 24 hour  Intake 370 ml  Output --  Net 370 ml   Filed Weights   07/19/24 1421  Weight: 36.7 kg    Exam:  General:  Appears calm and comfortable and is in NAD, on 5L [place] O2 Eyes:   normal lids, iris ENT:  grossly normal hearing, lips & tongue, mmm Cardiovascular:  RRR. No LE edema.  Respiratory:   CTA bilaterally with diminished breath sounds B.  Normal respiratory effort. Abdomen:  soft, NT, ND Skin:  no rash or  induration seen on limited exam Musculoskeletal:  grossly normal tone BUE/BLE, no bony abnormality Psychiatric:  blunted mood and affect, speech fluent and appropriate Neurologic:  CN 2-12 grossly intact, moves all extremities in coordinated fashion  Data Reviewed: I have reviewed the patient's lab results since admission.  Pertinent labs for today include:   None today     Family Communication: Sons, daughter, granddaughter were present  Disposition: Status is: Inpatient Remains inpatient appropriate because: ongoing management     Time spent: 50 minutes  Unresulted Labs (From admission, onward)     Start     Ordered   07/22/24 0500  CBC with Differential/Platelet  Tomorrow morning,   R        07/21/24 1811   07/22/24 0500  Basic metabolic panel with GFR  Tomorrow morning,   R        07/21/24 1811             Author: Delon Herald, MD 07/21/2024 6:14 PM  For on call review [email or web address].

## 2024-07-21 NOTE — Care Management Important Message (Signed)
 Important Message  Patient Details  Name: Kathy Villanueva MRN: [id number] Date of Birth: [birth year]   Important Message Given:  Yes - Medicare IM     Kathy Villanueva 07/21/2024, 1:49 PM

## 2024-07-21 NOTE — Progress Notes (Signed)
 PHARMACIST - PHYSICIAN COMMUNICATION DR:   Barbarann CONCERNING: Antibiotic IV to Oral Route Change Policy  RECOMMENDATION: This patient is receiving azithromycin  by the intravenous route.  Based on criteria approved by the Pharmacy and Therapeutics Committee, the antibiotic(s) is/are being converted to the equivalent oral dose form(s).   DESCRIPTION: These criteria include: Patient being treated for a respiratory tract infection, urinary tract infection, cellulitis or clostridium difficile associated diarrhea if on metronidazole The patient is not neutropenic and does not exhibit a GI malabsorption state The patient is eating (either orally or via tube) and/or has been taking other orally administered medications for a least 24 hours The patient is improving clinically and has a Tmax < 100.5  Sharlie Shreffler Rodriguez-Guzman PharmD, BCPS 07/21/2024 2:38 PM

## 2024-07-21 NOTE — Progress Notes (Addendum)
 Speech Language Pathology Treatment: Dysphagia  Patient Details Name: Kathy Villanueva MRN: [id number] DOB: 21-Mar-1924 Today's Date: 07/21/2024 Time: 1420-1500 SLP Time Calculation (min) (ACUTE ONLY): 40 min  Assessment / Plan / Recommendation Clinical Impression  Pt and Family seen for ongoing assessment of swallowing and toleration of diet; Education w/ Family on pt's swallowing presentation and supportive strategies and precautions for safer swallowing. Family present for session; education on general swallowing, dysphagia in setting of Cognitive decline(pt has Baseline Dementia). Pt is also of advanced age(89y). Noted Palliative Care following addressing: her advanced age, Alzheimer's Dis., poor p.o. intake and malnutrition, and risk for blood clots given history of pulmonary embolism. Also noted the discussion that aggressive care measure will not reverse her Alzheimer's disease process.    Pt on RA, afebrile currently.    Education re: swallowing in setting of Dementia, dysphagia and risk for aspiration/aspiration pneumonia, diet consistency rec'd, supportive strategies during meals, and general aspiration precautions including recommendation for safer swallowing of Pills by use of a PUREE -- for all Pill swallowing was had w/ Family present. Discussed the strategy of Pinching the straw to limit bolus volume and fast sipping/drinking in order to lessen risk for aspiration- highlighted need to be sitting Fully upright for any oral intake. Pt has a Baseline of Dementia, which Cognitive decline can hinder awareness of self and safe eating/drinking. She benefits from support w/ all po feeding at this time; 100% Supervision.    Discussed w/ Family pt's current diet consistency including use of Pureed foods moistened well in order to provide ease of oral intake, which could increase overall intake. Family reported no overt s/s of aspiration, choking w/ bites/sips pt has accepted in recent hours/day since  admit.   Highlighted w/ Family the general aspiration precautions as discussed to include Small sips Slowly, Pinched straw to limit volume, and Sitting fully Upright w/ all oral intake. Recommended reducing distractions including Talking and clearing mouth fully b/t Small bites/sips. Also discussed food consistencies and options, preparation, and use of condiments to soften/flavor foods also. Encouraged ALL Pills to be swallowed using a Puree- CRUSHED.   Handouts given to Family on aspiration precautions and Pill swallowing option; dysphagia drink Cup for volume control; diet consistency and preparation; swallowing strategies/suggestions.     Recommend continue a Dysphagia level 1(Puree) w/ Thin liquids- monitor Pinched straw use and small sips via Cup w/ aspiration precautions; Pills CRUSHED in a Puree for safer swallowing. Education completed w/ pt, Family; questions answered. Recommendation for f/u at next venue of care if needs indicate. Recommend continued f/u by Palliative Care to address impact of Dementia on swallowing and overall oral intake. No further Acute ST services indicated at this time. MD/NSG updated.      HPI HPI: Pt is a 89 y.o. female with medical history significant of Dementia per chart, hypertension, CKD 3, PE on Eliquis , presents to the emergency department for evaluation of worsening shortness of breath for the past week or so.  History is per granddaughter present at bedside.  Granddaughter states that the patient has become short of breath with minimal activity and reported new left-sided chest pain and foul smelling urine today.  She does have occasional cough and watery diarrhea for the past 2 days.  Family has declined a chest tube per chart.  Pt was hypothermic and tachypneic at admit in the ED.  CTA was  negative for acute pulmonary embolism, however significant for a  moderate size left hydropneumothorax.  Pt is a  Full Code.  OF NOTE: Pt was seen for cough and pharyngitis  in 06/2024. Pt's BMI is 13.9.      SLP Plan  All goals met; Education completed w/ Family members in room. Handouts given.           Recommendations  Diet recommendations: Dysphagia 2 (fine chop);Thin liquid (added purees) Liquids provided via: Cup;Straw (Pinched straw to limit bolus size/volume; Dysphagia drink Cup use) Medication Administration: Whole meds with puree (vs Crushed in Puree for safer swallowing, ease) Supervision: Staff to assist with self feeding;Full supervision/cueing for compensatory strategies Compensations: Minimize environmental distractions;Slow rate;Lingual sweep for clearance of pocketing;Small sips/bites;Multiple dry swallows after each bite/sip;Follow solids with liquid Postural Changes and/or Swallow Maneuvers: Out of bed for meals;Seated upright 90 degrees;Upright 30-60 min after meal (Reflux precs.)                 (Palliative Care f/u; Dietician f/u) Oral care BID;Oral care before and after PO;Staff/trained caregiver to provide oral care   Frequent or constant Supervision/Assistance Dysphagia, oropharyngeal phase (R13.12) (in setting of Dementia; advanced age(89); feeding dependency; deconditioning/malnutrition; Belching w/ po intake)     All goals met        Comer Portugal, MS, CCC-SLP Speech Language Pathologist Rehab Services; [facility] [place] [phone] (ascom) Levante Simones  07/21/2024, 5:49 PM

## 2024-07-22 DIAGNOSIS — R531 Weakness: Secondary | ICD-10-CM

## 2024-07-22 DIAGNOSIS — Z515 Encounter for palliative care: Secondary | ICD-10-CM | POA: Diagnosis not present

## 2024-07-22 DIAGNOSIS — F03C0 Unspecified dementia, severe, without behavioral disturbance, psychotic disturbance, mood disturbance, and anxiety: Secondary | ICD-10-CM

## 2024-07-22 DIAGNOSIS — J939 Pneumothorax, unspecified: Secondary | ICD-10-CM | POA: Diagnosis not present

## 2024-07-22 DIAGNOSIS — J9311 Primary spontaneous pneumothorax: Secondary | ICD-10-CM | POA: Diagnosis not present

## 2024-07-22 LAB — BASIC METABOLIC PANEL WITH GFR
Anion gap: 7 (ref 5–15)
BUN: 27 mg/dL — ABNORMAL HIGH (ref 8–23)
CO2: 22 mmol/L (ref 22–32)
Calcium: 8.1 mg/dL — ABNORMAL LOW (ref 8.9–10.3)
Chloride: 110 mmol/L (ref 98–111)
Creatinine, Ser: 1.16 mg/dL — ABNORMAL HIGH (ref 0.44–1.00)
GFR, Estimated: 43 mL/min — ABNORMAL LOW (ref >60)
Glucose, Bld: 79 mg/dL (ref 70–99)
Potassium: 4.4 mmol/L (ref 3.5–5.1)
Sodium: 139 mmol/L (ref 135–145)

## 2024-07-22 LAB — CBC WITH DIFFERENTIAL/PLATELET
Abs Immature Granulocytes: 0.02 K/uL (ref 0.00–0.07)
Basophils Absolute: 0 K/uL (ref 0.0–0.1)
Basophils Relative: 1 %
Eosinophils Absolute: 0.5 K/uL (ref 0.0–0.5)
Eosinophils Relative: 9 %
HCT: 26.3 % — ABNORMAL LOW (ref 36.0–46.0)
Hemoglobin: 8 g/dL — ABNORMAL LOW (ref 12.0–15.0)
Immature Granulocytes: 0 %
Lymphocytes Relative: 15 %
Lymphs Abs: 0.9 K/uL (ref 0.7–4.0)
MCH: 29.5 pg (ref 26.0–34.0)
MCHC: 30.4 g/dL (ref 30.0–36.0)
MCV: 97 fL (ref 80.0–100.0)
Monocytes Absolute: 0.6 K/uL (ref 0.1–1.0)
Monocytes Relative: 10 %
Neutro Abs: 3.9 K/uL (ref 1.7–7.7)
Neutrophils Relative %: 65 %
Platelets: 223 K/uL (ref 150–400)
RBC: 2.71 MIL/uL — ABNORMAL LOW (ref 3.87–5.11)
RDW: 15.9 % — ABNORMAL HIGH (ref 11.5–15.5)
WBC: 5.9 K/uL (ref 4.0–10.5)
nRBC: 0 % (ref 0.0–0.2)

## 2024-07-22 LAB — URINE CULTURE: Culture: >=100000 — AB

## 2024-07-22 MED ORDER — FENTANYL CITRATE PF 50 MCG/ML IJ SOSY
12.5000 ug | PREFILLED_SYRINGE | INTRAMUSCULAR | Status: DC | PRN
  Administered 2024-07-23: 12.5 ug via INTRAVENOUS
  Filled 2024-07-22: qty 1

## 2024-07-22 MED ORDER — OXYCODONE HCL 5 MG PO TABS
2.5000 mg | ORAL_TABLET | ORAL | Status: DC | PRN
  Administered 2024-07-22 – 2024-07-23 (×2): 2.5 mg via ORAL
  Filled 2024-07-22 (×3): qty 1

## 2024-07-22 NOTE — Plan of Care (Signed)
  Problem: Education: Goal: Knowledge of General Education information will improve Description: Including pain rating scale, medication(s)/side effects and non-pharmacologic comfort measures Outcome: Progressing   Problem: Health Behavior/Discharge Planning: Goal: Ability to manage health-related needs will improve Outcome: Progressing   Problem: Clinical Measurements: Goal: Ability to maintain clinical measurements within normal limits will improve Outcome: Progressing   Problem: Clinical Measurements: Goal: Diagnostic test results will improve Outcome: Progressing   Problem: Activity: Goal: Risk for activity intolerance will decrease Outcome: Progressing   

## 2024-07-22 NOTE — Progress Notes (Signed)
 Palliative Care Progress Note, Assessment & Plan   Patient Name: Kathy Villanueva       Date: 07/22/2024 DOB: [birth year]  Age: 89 y.o. MRN#: [id number] Attending Physician: Barbarann Nest, MD Primary Care Physician: Rudolpho Norleen BIRCH, MD Admit Date: 07/19/2024  Subjective: Patient is lying in bed on her right side, asleep.  She does not awaken to my presence.  She does not engage in conversation.   HPI: 89 y.o. female  with past medical history of advanced Alzheimer's dementia, decreasing appetite, hypertension, pulmonary embolism on Eliquis  admitted on 07/19/2024 with shortness of breath and foul-smelling urine.   Patient is being treated with antibiotics and supplemental oxygen.   PMT was consulted to support patient and family with goals of care discussions.  Summary of counseling/coordination of care: Extensive chart review completed prior to meeting patient including labs, vital signs, imaging, progress notes, orders, and available advanced directive documents from current and previous encounters.   After reviewing the patient's chart and assessing the patient at bedside, I spoke with patient's friend at bedside in regards to symptom management and goals of care.   Patient appears comfortable with no nonverbal signs of distress such as grimacing, moaning, brow furling, or fidgeting.  No adjustment to [facility] needed at this time.  Discussed that plan remains for patient to discharge home and to continue with outpatient palliative services.  Reviewed that patient is no longer requiring supplemental oxygen and that we will continue to monitor her oxygen levels and give it should she need it.  However, I highlighted that patient is likely approaching end-of-life that she is not eating or drinking enough to  sustain her needed caloric daily intake.  Friend at bedside shares that she will continue to encourage p.o. intake.  Discussed that patient's overall prognosis is poor given her poor functional, nutritional, and cognitive status.  Friend at bedside endorsed understanding.  Counseled with attending.  We are both concerned that patient is having increased pain and unable to communicate where it is.  She has advanced dementia with little to no p.o. intake.   Per my discussions with family yesterday they are aware that this could be EOL for patient.  However, her vitals stay stable and they remain hopeful that she can return home.  Therefore, PMT will continue to follow and support.  No adjustment to plan of care at this time.  Physical Exam Vitals reviewed.  Constitutional:      Comments: Thin  HENT:     Head:     Comments: Temporal wasting Pulmonary:     Effort: Pulmonary effort is normal.  Musculoskeletal:     Comments: Generalized weakness  Skin:    General: Skin is warm and dry.  Psychiatric:        Mood and Affect: Mood normal.        Behavior: Behavior normal. Behavior is not agitated.             Total Time 35 minutes   Time spent includes: Detailed review of medical records (labs, imaging, vital signs), medically appropriate exam (mental status, respiratory, cardiac, skin), discussed with treatment team, counseling and educating patient, family and staff, documenting clinical information, medication management and  coordination of care.  Lamarr L. Arvid, DNP, FNP-BC Palliative Medicine Team

## 2024-07-22 NOTE — Plan of Care (Signed)
  Problem: Clinical Measurements: Goal: Ability to maintain clinical measurements within normal limits will improve Outcome: Progressing   Problem: Nutrition: Goal: Adequate nutrition will be maintained Outcome: Progressing   Problem: Coping: Goal: Level of anxiety will decrease Outcome: Progressing   

## 2024-07-22 NOTE — Progress Notes (Signed)
 Progress Note   Patient: Kathy Villanueva FMW:[id number] DOB: [birth year] DOA: 07/19/2024     3 DOS: the patient was seen and examined on 07/22/2024   Brief hospital course: 89yo with h/o advanced Alzheimer's dementia, HTN, PE on Eliquis , and anorexia who presented on 8/11 with SOB.  She was found to have UTI and hydropneumothorax; family refused vs. EDP did not recommend chest tube.  She was started on antibiotics and oxygen and is improving.  Patient changed to DNR, family is considering home hospice.  Assessment and Plan:  Hydropneumothorax with acute hypoxic respiratory failure CXR less remarkable than CTA.  No tension physiology seen on CTA Tube thoracostomy refused by patient's family Stable on serial CXRs Respiratory status improving with  O2 and antibiotics Continue with antibiotics for now COVID/flu/RSV: Negative Continue with as needed DuoNebs and antitussives   UTI Culture with >100k colonies E coli Continue Ceftriaxone  for now   History of pulmonary embolism Chronic thrombosis seen on CTA Patient was on Eliquis  prior to arrival; will continue   Hypertension/Sinus bradycardia Resume low-dose carvedilol  As needed IV hydralazine    Generalized weakness/FTT/Advanced Alzheimer's dementia PT/OT/SLP consulted She has an abundance of family who care for her along with caregivers 24/7 at home Family is considering addition of home hospice Patient is eager to go home when possible   Severe protein calorie malnutrition Nutrition Problem: Severe Malnutrition Etiology: chronic illness (dementia) Signs/Symptoms: severe fat depletion, severe muscle depletion, percent weight loss Interventions: Boost Plus, MVI, Magic cup   CKD stage IIIb Appears to be stable at this time Attempt to avoid nephrotoxic medications Recheck BMP in AM    CAD/Aortic arch abnormality/thoracic aneurysm See CTA for more specific read Outpatient follow-up with PCP as desired She is not a reasonable  candidate for repair   Macrocytic anemia Hemoglobin is down, trended from 11.7-8.3 No obvious blood loss Baseline Hemoglobin appears to be around 9 Recheck CBC in AM   Very poor prognosis Patient is frail, cachectic with advanced Alzheimer's dementia, progressive weight loss outpatient, and advanced age at 89 years old She appears to be nearing end-of-life Palliative care has also been consulted to assist in GOC conversations She is now DNR Family is considering home hospice       Consultants: Palliative care PT OT Nutrition   Procedures: None   Antibiotics: Azithromycin  8/11- Ceftriaxone  8/11-   30 Day Unplanned Readmission Risk Score    Flowsheet Row ED to Hosp-Admission (Current) from 07/19/2024 in [facility]  30 Day Unplanned Readmission Risk Score (%) 22.98 Filed at 07/22/2024 0801    This score is the patient's risk of an unplanned readmission within 30 days of being discharged (0 -100%). The score is based on dignosis, age, lab data, medications, orders, and past utilization.   Low:  0-14.9   Medium: 15-21.9   High: 22-29.9   Extreme: 30 and above           Subjective: Granddaughter reports that she does have intermittent sacral pain, and was complaining about this today.  She was given pain medication and is now somnolent.  BP is also marginal.  Her daughter was with her last night, and she tends to be rough with her.  She hollered all night.  Her granddaughter will come and check on her now.   Objective: Vitals:   07/22/24 1522 07/22/24 1554  BP: 114/66 (!) 89/58  Pulse: 66 69  Resp: (!) 22   Temp: 97.9 F (36.6 C) 98.4 F (36.9  C)  SpO2: 99% 94%    Intake/Output Summary (Last 24 hours) at 07/22/2024 1623 Last data filed at 07/22/2024 1300 Gross per 24 hour  Intake 100 ml  Output --  Net 100 ml   Filed Weights   07/19/24 1421  Weight: 36.7 kg    Exam:  General:  Appears calm and comfortable and is in NAD, on 2L  [place] O2, somnolent today Eyes:   normal lids, eyes closed throughout ENT:  grossly normal hearing, lips & tongue, mmm Cardiovascular:  RRR. No LE edema.  Respiratory:   CTA bilaterally with diminished breath sounds B.  Normal respiratory effort. Abdomen:  soft, NT, ND Skin:  no rash or induration seen on limited exam Musculoskeletal:  grossly normal tone BUE/BLE, no bony abnormality Psychiatric:  blunted mood and affect, speech minimal today, much more somnolent and less interactive Neurologic:  nonfocal but limited today  Data Reviewed: I have reviewed the patient's lab results since admission.  Pertinent labs for today include:   BUN 27/Creatinine 1.16/GFR 43, stable WBC 5.9 Hgb 8 Urine culture E coli, resistant only to Ampicillin    Family Communication: None present; I called and spoke with her granddaughter by telephone and she is planning to come in now  Disposition: Status is: Inpatient Remains inpatient appropriate because: ongoing management     Time spent: 50 minutes  Unresulted Labs (From admission, onward)     Start     Ordered   07/23/24 0500  CBC with Differential/Platelet  Tomorrow morning,   R        07/22/24 1623   07/23/24 0500  Basic metabolic panel with GFR  Tomorrow morning,   R        07/22/24 1623             Author: Delon Herald, MD 07/22/2024 4:23 PM  For on call review [email or web address].

## 2024-07-23 ENCOUNTER — Other Ambulatory Visit: Payer: Self-pay

## 2024-07-23 DIAGNOSIS — J948 Other specified pleural conditions: Secondary | ICD-10-CM

## 2024-07-23 DIAGNOSIS — N1832 Chronic kidney disease, stage 3b: Secondary | ICD-10-CM | POA: Diagnosis present

## 2024-07-23 DIAGNOSIS — B9620 Unspecified Escherichia coli [E. coli] as the cause of diseases classified elsewhere: Secondary | ICD-10-CM | POA: Diagnosis present

## 2024-07-23 LAB — BASIC METABOLIC PANEL WITH GFR
Anion gap: 6 (ref 5–15)
BUN: 33 mg/dL — ABNORMAL HIGH (ref 8–23)
CO2: 22 mmol/L (ref 22–32)
Calcium: 8.4 mg/dL — ABNORMAL LOW (ref 8.9–10.3)
Chloride: 111 mmol/L (ref 98–111)
Creatinine, Ser: 1.42 mg/dL — ABNORMAL HIGH (ref 0.44–1.00)
GFR, Estimated: 33 mL/min — ABNORMAL LOW (ref >60)
Glucose, Bld: 87 mg/dL (ref 70–99)
Potassium: 4.9 mmol/L (ref 3.5–5.1)
Sodium: 139 mmol/L (ref 135–145)

## 2024-07-23 LAB — CBC WITH DIFFERENTIAL/PLATELET
Abs Immature Granulocytes: 0.03 K/uL (ref 0.00–0.07)
Basophils Absolute: 0 K/uL (ref 0.0–0.1)
Basophils Relative: 1 %
Eosinophils Absolute: 0.7 K/uL — ABNORMAL HIGH (ref 0.0–0.5)
Eosinophils Relative: 11 %
HCT: 28.8 % — ABNORMAL LOW (ref 36.0–46.0)
Hemoglobin: 8.9 g/dL — ABNORMAL LOW (ref 12.0–15.0)
Immature Granulocytes: 1 %
Lymphocytes Relative: 17 %
Lymphs Abs: 1.1 K/uL (ref 0.7–4.0)
MCH: 29.8 pg (ref 26.0–34.0)
MCHC: 30.9 g/dL (ref 30.0–36.0)
MCV: 96.3 fL (ref 80.0–100.0)
Monocytes Absolute: 0.6 K/uL (ref 0.1–1.0)
Monocytes Relative: 9 %
Neutro Abs: 3.8 K/uL (ref 1.7–7.7)
Neutrophils Relative %: 61 %
Platelets: 248 K/uL (ref 150–400)
RBC: 2.99 MIL/uL — ABNORMAL LOW (ref 3.87–5.11)
RDW: 16.1 % — ABNORMAL HIGH (ref 11.5–15.5)
WBC: 6.2 K/uL (ref 4.0–10.5)
nRBC: 0 % (ref 0.0–0.2)

## 2024-07-23 MED ORDER — OXYCODONE HCL 5 MG PO TABS
2.5000 mg | ORAL_TABLET | ORAL | 0 refills | Status: AC | PRN
  Filled 2024-07-23: qty 30, 10d supply, fill #0

## 2024-07-23 NOTE — TOC Transition Note (Signed)
 Transition of Care [facility]) - Discharge Note   Patient Details  Name: Kathy Villanueva MRN: [id number] Date of Birth: [birth year]  [facility]) CM/SW Contact:  Lauraine [name] Carpen, LCSW Phone Number: 07/23/2024, 3:50 PM   Clinical Narrative:  Patient has orders to discharge home today. Per MD, family wants home hospice and prefers [facility]. Liaison is aware. Per RN, family requesting ambulance transport home. CSW met with family and confirmed address on the facesheet is correct. [facility] has been arranged and she is 7th on the list. No further concerns. CSW signing off.   Final next level of care: Home w Hospice Care Barriers to Discharge: Barriers Resolved   Patient Goals and CMS Choice            Discharge Placement                Patient to be transferred to facility by: [facility]   Patient and family notified of of transfer: 07/23/24  Discharge Plan and Services Additional resources added to the After Visit Summary for                                       Social Drivers of Health (SDOH) Interventions SDOH Screenings   Food Insecurity: Patient Declined (07/20/2024)  Housing: Patient Declined (07/20/2024)  Transportation Needs: Patient Declined (07/20/2024)  Utilities: Patient Declined (07/20/2024)  Financial Resource Strain: Low Risk  (11/27/2022)   Received from [facility]  Social Connections: Patient Declined (07/20/2024)  Tobacco Use: Low Risk  (07/19/2024)     Readmission Risk Interventions     No data to display

## 2024-07-23 NOTE — Progress Notes (Signed)
 [facility] Liaison Note  Received request from Lauraine Carpen, Transitions of Care [facility]), for hospice services at home after discharge. Spoke with [name] Lesches, Granddaughter, to initiate education related to hospice philosophy, services, and team approach to care. Patient/family verbalized understanding of information given. Per discussion, the plan is for discharge home by EMS today.  DME needs discussed.  Patient has the following equipment in the home:  transport wheelchair Patient/family requests the following equipment for delivery:   Hospital bed, 2 rails, over bed table and Oxygen        The address has been verified and is correct in the chart. Marielys Trinidad is the family contact to arrange time of equipment delivery.  There is no need to hold hospital dc for DME delivery- per [name] Lesches.   Please send signed and completed DNR home with patient/family if applicable.  Please provide prescriptions at discharge as needed to ensure ongoing symptom management.  [facility] information and contact numbers given to [employer].  Above information shared with Lauraine Carpen, [facility] and hospital medical care team.   Please call with any hospice related questions or concerns. Thank you for the opportunity to participate in this patient's care.   Saddie HILARIO Na, MA, BSN, RN, FNE Nurse Liaison [phone]

## 2024-07-23 NOTE — Discharge Summary (Signed)
 Physician Discharge Summary   Patient: Kathy Villanueva MRN: [id number] DOB: 1924-09-05  Admit date:     07/19/2024  Discharge date: 07/23/24  Discharge Physician: Delon Herald   PCP: Rudolpho Norleen BIRCH, MD   Recommendations at discharge:   You are being discharged home with home hospice You are being prescribed a limited number of narcotic pain pills; take as directed and do not drive or make important decisions while taking this medication   Discharge Diagnoses: Principal Problem:   Hydropneumothorax Active Problems:   Senile dementia uncomp, with behavioral disturbance (HCC)   Protein-calorie malnutrition, severe   HTN (hypertension)   E. coli UTI   Chronic kidney disease, stage 3b [facility])    Hospital Course: 89yo with h/o advanced Alzheimer's dementia, HTN, PE on Eliquis , and anorexia who presented on 8/11 with SOB.  She was found to have UTI and hydropneumothorax; family refused vs. EDP did not recommend chest tube.  She was started on antibiotics and oxygen and is improving.  Patient changed to DNR, family is considering home hospice.  Assessment and Plan:  Hydropneumothorax with acute hypoxic respiratory failure Presented with SOB, hypoxia documented to 89% CXR less remarkable than CTA.  No tension physiology seen on CTA Tube thoracostomy refused by patient's family Stable on serial CXRs Respiratory status improving with [place] O2 and antibiotics Continue with antibiotics for now, completed 5 days of Ceftriaxone  + Azithromycin  COVID/flu/RSV: Negative Continue with as needed DuoNebs and antitussives   UTI Culture with >100k colonies E coli Treated with Ceftriaxone  x 5 days   History of pulmonary embolism Chronic thrombosis seen on CTA Patient was on Eliquis  prior to arrival; will continue   Hypertension/Sinus bradycardia Resume low-dose carvedilol  As needed IV hydralazine    Generalized weakness/FTT/Advanced Alzheimer's dementia PT/OT/SLP consulted She has an  abundance of family who care for her along with caregivers 24/7 at home Family is in agreement with addition of home hospice Patient is eager to go home when possible   Severe protein calorie malnutrition Nutrition Problem: Severe Malnutrition Etiology: chronic illness (dementia) Signs/Symptoms: severe fat depletion, severe muscle depletion, percent weight loss Interventions: Boost Plus, MVI, Magic cup   CKD stage IIIb Appears to be stable at this time Attempt to avoid nephrotoxic medications   CAD/Aortic arch abnormality/thoracic aneurysm See CTA for more specific read Outpatient follow-up with PCP as desired She is not a reasonable candidate for repair   Macrocytic anemia Hemoglobin is down, trended from 11.7-8.3 No obvious blood loss Baseline Hemoglobin appears to be around 9 Recheck CBC in AM   Very poor prognosis Patient is frail, cachectic with advanced Alzheimer's dementia, progressive weight loss outpatient, and advanced age at 89 years old She appears to be nearing end-of-life Palliative care has also been consulted to assist in GOC conversations She is now DNR Family is agreeing to home hospice       Consultants: Palliative care PT OT Nutrition   Procedures: None   Antibiotics: Azithromycin  8/11-15 Ceftriaxone  8/11-15      Pain control - [place]  Controlled Substance Reporting System database was reviewed. and patient was instructed, not to drive, operate heavy machinery, perform activities at heights, swimming or participation in water activities or provide baby-sitting services while on Pain, Sleep and Anxiety Medications; until their outpatient Physician has advised to do so again. Also recommended to not to take more than prescribed Pain, Sleep and Anxiety Medications.   Disposition: Home Diet recommendation:  Dysphagia 2 diet with extra gravies on meats, potatoes, puddings, cream  soups only DISCHARGE MEDICATION: Allergies as of 07/23/2024    No Known Allergies      Medication List     TAKE these medications    acetaminophen  325 MG tablet Commonly known as: TYLENOL  Take 650 mg by mouth every 4 (four) hours as needed.   apixaban  2.5 MG Tabs tablet Commonly known as: ELIQUIS  Take 1 tablet (2.5 mg total) by mouth 2 (two) times daily.   carvedilol  3.125 MG tablet Commonly known as: COREG  Take 3.125 mg by mouth 2 (two) times daily with a meal.   cyanocobalamin  1000 MCG tablet Commonly known as: VITAMIN B12 Take 1 tablet by mouth daily.   feeding supplement Liqd Take 237 mLs by mouth 2 (two) times daily between meals.   mirtazapine 7.5 MG tablet Commonly known as: REMERON Take 3.75 mg by mouth at bedtime.   multivitamin with minerals Tabs tablet Take 1 tablet by mouth daily.   oxyCODONE  5 MG immediate release tablet Commonly known as: Oxy IR/ROXICODONE  Take 0.5 tablets (2.5 mg total) by mouth every 4 (four) hours as needed for moderate pain (pain score 4-6).   polyethylene glycol 17 g packet Commonly known as: MiraLax  Take 17 g by mouth daily. What changed:  when to take this reasons to take this        Discharge Exam:    Subjective: Feeling ok, no complaints, wants to go home.  Discussed with great granddaughter (at bedside) and granddaughter (by telephone) - they see that she is dwindling and want to take her home with home hospice today.  They prefer to transport her themselves.  She is eating little, mostly tasting.   Objective: Vitals:   07/23/24 0933 07/23/24 1303  BP: (!) 161/107 111/63  Pulse:  71  Resp:  (!) 21  Temp: 97.6 F (36.4 C) 97.7 F (36.5 C)  SpO2: 95% 100%   No intake or output data in the 24 hours ending 07/23/24 1414  Filed Weights   07/19/24 1421  Weight: 36.7 kg    Exam:  General:  Appears calm and comfortable and is in NAD, on RA, less conversant Eyes:   normal lids, iris ENT:  grossly normal hearing, lips & tongue, mmm Cardiovascular:  RRR. No LE  edema.  Respiratory:   CTA bilaterally with diminished breath sounds B.  Normal respiratory effort. Abdomen:  soft, NT, ND Skin:  no rash or induration seen on limited exam Musculoskeletal:  grossly normal tone BUE/BLE, no bony abnormality Psychiatric:  blunted mood and affect, speech minimal today, much more somnolent and less interactive Neurologic:  nonfocal but limited today  Data Reviewed: I have reviewed the patient's lab results since admission.  Pertinent labs for today include:  BUN 33/Creatinine 1.42/GFR 33 WBC 6.2 Hgb 8.9, stable     Condition at discharge: fair  The results of significant diagnostics from this hospitalization (including imaging, microbiology, ancillary and laboratory) are listed below for reference.   Imaging Studies: DG Chest Port 1 View Result Date: 07/20/2024 CLINICAL DATA:  Hydropneumothorax. EXAM: PORTABLE CHEST 1 VIEW COMPARISON:  July 19, 2024. FINDINGS: Stable cardiomediastinal silhouette. Right lung is clear. Stable mild left basilar opacity concerning for atelectasis or infiltrate with small left pleural effusion. Bony thorax is unremarkable. No definite pneumothorax. IMPRESSION: Stable left basilar opacity as noted above. Electronically Signed   By: Lynwood Landy Raddle M.D.   On: 07/20/2024 10:40   CT Angio Chest PE W and/or Wo Contrast Addendum Date: 07/19/2024 ADDENDUM REPORT: 07/19/2024 22:25 ADDENDUM: These  results were called by telephone at the time of interpretation on 07/19/2024 at 9:58 Pm to provider NEHA RAY , who verbally acknowledged these results. Electronically Signed   By: Dorethia Molt M.D.   On: 07/19/2024 22:25   Result Date: 07/19/2024 CLINICAL DATA:  High probability pulmonary embolism, dyspnea EXAM: CT ANGIOGRAPHY CHEST WITH CONTRAST TECHNIQUE: Multidetector CT imaging of the chest was performed using the standard protocol during bolus administration of intravenous contrast. Multiplanar CT image reconstructions and MIPs were  obtained to evaluate the vascular anatomy. RADIATION DOSE REDUCTION: This exam was performed according to the departmental dose-optimization program which includes automated exposure control, adjustment of the mA and/or kV according to patient size and/or use of iterative reconstruction technique. CONTRAST:  60mL OMNIPAQUE  IOHEXOL  350 MG/ML SOLN COMPARISON:  03/15/2008 FINDINGS: Cardiovascular: There is adequate opacification of the pulmonary arterial tree. The central pulmonary arteries are enlarged in keeping with changes of pulmonary arterial hypertension. There is evidence of remote pulmonary embolism with chronic thrombosis of the right middle lobe are and segmental pulmonary arteries (85/4). No intraluminal filling defect is identified to suggest acute pulmonary embolism. Extensive multi-vessel coronary artery calcification. Global cardiac size is within normal limits. The descending thoracic aorta demonstrates fusiform dilation measuring 4.0 cm in [place] proximally and 3.0 cm in diameter distally just above the diaphragmatic hiatus. The ascending aorta is of normal caliber. This appears progressive since prior examination where this measured 3.7 cm in maximal diameter. Additionally, there is a focal disruption of mural calcification and soft tissue excrescence involving the aortic arch at 52/4 measuring 15 mm in diameter and 8 mm in depth suspicious for a penetrating atherosclerotic ulcer. This is not well evaluated on this examination due to bolus timing. Mediastinum/Nodes: Visualized thyroid  is atrophic. No pathologic thoracic adenopathy. Moderate mass effect upon the distal esophagus by the crossing aorta without evidence of obstruction. Lungs/Pleura: Moderate left hydropneumothorax is present with a small basilar dependent fluid component and small to moderate gaseous component. No mediastinal shift or hyperexpansion of the left hemithorax to suggest tension physiology. Mild emphysema. No pneumothorax on  the right. No pleural effusion on the right. Bronchial wall thickening noted in keeping with airway inflammation. Bronchomalacia involving the right mainstem bronchus and bronchus intermedius. Upper Abdomen: 12 mm nonobstructing calculus within the visualized right renal hilum. No acute abnormality. Musculoskeletal: No chest wall abnormality. No acute or significant osseous findings. Review of the MIP images confirms the above findings. IMPRESSION: 1. No evidence of acute pulmonary embolism. 2. Moderate left hydropneumothorax. No mediastinal shift or hyperexpansion of the left hemithorax to suggest tension physiology. 3. Fusiform dilation of the descending thoracic aorta measuring up to 4.0 cm in diameter, progressive since prior examination. If clinically indicated given the patient's medical comorbidities, recommend semi-annual imaging followup by CTA or MRA and referral to cardiothoracic surgery if not already obtained. This recommendation follows 2010 ACCF/AHA/AATS/ACR/ASA/SCA/SCAI/SIR/STS/SVM Guidelines for the Diagnosis and Management of Patients With Thoracic Aortic Disease. Circulation. 2010; 121: Z733-z63. Aortic aneurysm NOS (ICD10-I71.9) 4. Focal disruption of mural calcification and soft tissue excrescence involving the aortic arch measuring 15 mm in diameter and 8 mm in depth suspicious for a penetrating atherosclerotic ulcer. This is not well evaluated on this examination due to bolus timing. Dedicated nonemergent CT arteriography of the thoracic aorta would be helpful for further evaluation if clinically indicated. 5. Extensive multi-vessel coronary artery calcification. 6. Mild emphysema. 7. Bronchial wall thickening in keeping with airway inflammation. Bronchomalacia involving the right mainstem bronchus and bronchus intermedius.  8. 12 mm nonobstructing calculus within the visualized right renal hilum. Aortic Atherosclerosis (ICD10-I70.0) and Emphysema (ICD10-J43.9). Electronically Signed: By:  Dorethia Molt M.D. On: 07/19/2024 21:52   DG Chest Port 1 View Result Date: 07/19/2024 EXAM: 1 VIEW XRAY OF THE CHEST 07/19/2024 03:17:00 PM COMPARISON: 12/03/2022 CLINICAL HISTORY: Labored breathing FINDINGS: LUNGS AND PLEURA: Hyperinflation. Clearing of right lower lobe airspace disease. Patchy left basilar airspace disease is new. Small left pleural effusion. Favor a skin fold over the superolateral left hemithorax. HEART AND MEDIASTINUM: Normal heart size. Tortuous thoracic aorta. BONES AND SOFT TISSUES: No acute osseous abnormality. IMPRESSION: 1. New patchy left basilar airspace disease, favoring pneumonia or aspiration . 2. Small left pleural effusion. 3. Probable skin fold over the superolateral left hemithorax. If high clinical concern of tiny pneumothorax, consider PA radiograph. Electronically signed by: Rockey Kilts MD 07/19/2024 04:23 PM EDT RP Workstation: [id number]    Microbiology: Results for orders placed or performed during the hospital encounter of 07/19/24  Urine Culture     Status: Abnormal   Collection Time: 07/19/24  9:08 PM   Specimen: Urine, Random  Result Value Ref Range Status   Specimen Description   Final    URINE, RANDOM Performed at [facility], [address] [place], [place] [address]    Special Requests   Final    NONE Reflexed from [id number] Performed at [facility], [address] [place], [place] [address]    Culture >=100,000 COLONIES/mL ESCHERICHIA COLI (A)  Final   Report Status 07/22/2024 FINAL  Final   Organism ID, Bacteria ESCHERICHIA COLI (A)  Final      Susceptibility   Escherichia coli - MIC*    AMPICILLIN >=32 RESISTANT Resistant     CEFAZOLIN  Value in next row Sensitive      2 SENSITIVEThis is a modified FDA-approved test that has been validated and its performance characteristics determined by the reporting laboratory.  This laboratory is certified under the Clinical Laboratory Improvement Amendments CLIA as qualified  to perform high complexity clinical laboratory testing.    CEFEPIME Value in next row Sensitive      2 SENSITIVEThis is a modified FDA-approved test that has been validated and its performance characteristics determined by the reporting laboratory.  This laboratory is certified under the Clinical Laboratory Improvement Amendments CLIA as qualified to perform high complexity clinical laboratory testing.    ERTAPENEM Value in next row Sensitive      2 SENSITIVEThis is a modified FDA-approved test that has been validated and its performance characteristics determined by the reporting laboratory.  This laboratory is certified under the Clinical Laboratory Improvement Amendments CLIA as qualified to perform high complexity clinical laboratory testing.    CEFTRIAXONE  Value in next row Sensitive      2 SENSITIVEThis is a modified FDA-approved test that has been validated and its performance characteristics determined by the reporting laboratory.  This laboratory is certified under the Clinical Laboratory Improvement Amendments CLIA as qualified to perform high complexity clinical laboratory testing.    CIPROFLOXACIN Value in next row Sensitive      2 SENSITIVEThis is a modified FDA-approved test that has been validated and its performance characteristics determined by the reporting laboratory.  This laboratory is certified under the Clinical Laboratory Improvement Amendments CLIA as qualified to perform high complexity clinical laboratory testing.    GENTAMICIN Value in next row Sensitive      2 SENSITIVEThis is a modified FDA-approved test that has been validated and its  performance characteristics determined by the reporting laboratory.  This laboratory is certified under the Clinical Laboratory Improvement Amendments CLIA as qualified to perform high complexity clinical laboratory testing.    NITROFURANTOIN Value in next row Sensitive      2 SENSITIVEThis is a modified FDA-approved test that has been  validated and its performance characteristics determined by the reporting laboratory.  This laboratory is certified under the Clinical Laboratory Improvement Amendments CLIA as qualified to perform high complexity clinical laboratory testing.    TRIMETH/SULFA Value in next row Sensitive      2 SENSITIVEThis is a modified FDA-approved test that has been validated and its performance characteristics determined by the reporting laboratory.  This laboratory is certified under the Clinical Laboratory Improvement Amendments CLIA as qualified to perform high complexity clinical laboratory testing.    AMPICILLIN/SULBACTAM Value in next row Sensitive      2 SENSITIVEThis is a modified FDA-approved test that has been validated and its performance characteristics determined by the reporting laboratory.  This laboratory is certified under the Clinical Laboratory Improvement Amendments CLIA as qualified to perform high complexity clinical laboratory testing.    PIP/TAZO Value in next row Sensitive ug/mL     <=4 SENSITIVEThis is a modified FDA-approved test that has been validated and its performance characteristics determined by the reporting laboratory.  This laboratory is certified under the Clinical Laboratory Improvement Amendments CLIA as qualified to perform high complexity clinical laboratory testing.    MEROPENEM Value in next row Sensitive      <=4 SENSITIVEThis is a modified FDA-approved test that has been validated and its performance characteristics determined by the reporting laboratory.  This laboratory is certified under the Clinical Laboratory Improvement Amendments CLIA as qualified to perform high complexity clinical laboratory testing.    * >=100,000 COLONIES/mL ESCHERICHIA COLI  Resp panel by RT-PCR (RSV, Flu A&B, Covid) Anterior Nasal Swab     Status: None   Collection Time: 07/20/24 12:47 AM   Specimen: Anterior Nasal Swab  Result Value Ref Range Status   SARS Coronavirus 2 by RT PCR NEGATIVE  NEGATIVE Final    Comment: (NOTE) SARS-CoV-2 target nucleic acids are NOT DETECTED.  The SARS-CoV-2 RNA is generally detectable in upper respiratory specimens during the acute phase of infection. The lowest concentration of SARS-CoV-2 viral copies this assay can detect is 138 copies/mL. A negative result does not preclude SARS-Cov-2 infection and should not be used as the sole basis for treatment or other patient management decisions. A negative result may occur with  improper specimen collection/handling, submission of specimen other than nasopharyngeal swab, presence of viral mutation(s) within the areas targeted by this assay, and inadequate number of viral copies(<138 copies/mL). A negative result must be combined with clinical observations, patient history, and epidemiological information. The expected result is Negative.  Fact Sheet for Patients:  BloggerCourse.com  Fact Sheet for Healthcare Providers:  SeriousBroker.it  This test is no t yet approved or cleared by the [place]  FDA and  has been authorized for detection and/or diagnosis of SARS-CoV-2 by FDA under an Emergency Use Authorization (EUA). This EUA will remain  in effect (meaning this test can be used) for the duration of the COVID-19 declaration under Section 564(b)(1) of the Act, 21 U.S.C.section 360bbb-3(b)(1), unless the authorization is terminated  or revoked sooner.       Influenza A by PCR NEGATIVE NEGATIVE Final   Influenza B by PCR NEGATIVE NEGATIVE Final    Comment: (NOTE) The Xpert Xpress SARS-CoV-2/FLU/RSV  plus assay is intended as an aid in the diagnosis of influenza from Nasopharyngeal swab specimens and should not be used as a sole basis for treatment. Nasal washings and aspirates are unacceptable for Xpert Xpress SARS-CoV-2/FLU/RSV testing.  Fact Sheet for Patients: BloggerCourse.com  Fact Sheet for Healthcare  Providers: SeriousBroker.it  This test is not yet approved or cleared by the [place]  FDA and has been authorized for detection and/or diagnosis of SARS-CoV-2 by FDA under an Emergency Use Authorization (EUA). This EUA will remain in effect (meaning this test can be used) for the duration of the COVID-19 declaration under Section 564(b)(1) of the Act, 21 U.S.C. section 360bbb-3(b)(1), unless the authorization is terminated or revoked.     Resp Syncytial Virus by PCR NEGATIVE NEGATIVE Final    Comment: (NOTE) Fact Sheet for Patients: BloggerCourse.com  Fact Sheet for Healthcare Providers: SeriousBroker.it  This test is not yet approved or cleared by the [place]  FDA and has been authorized for detection and/or diagnosis of SARS-CoV-2 by FDA under an Emergency Use Authorization (EUA). This EUA will remain in effect (meaning this test can be used) for the duration of the COVID-19 declaration under Section 564(b)(1) of the Act, 21 U.S.C. section 360bbb-3(b)(1), unless the authorization is terminated or revoked.  Performed at [facility], [address] [place], [place] [address]     Labs: CBC: Recent Labs  Lab 07/19/24 1715 07/20/24 0510 07/22/24 0559 07/23/24 0423  WBC 9.0 7.5 5.9 6.2  NEUTROABS  --   --  3.9 3.8  HGB 11.7* 8.3* 8.0* 8.9*  HCT 39.4 28.1* 26.3* 28.8*  MCV 100.3* 100.4* 97.0 96.3  PLT 222 202 223 248   Basic Metabolic Panel: Recent Labs  Lab 07/19/24 1502 07/19/24 1715 07/20/24 0510 07/22/24 0559 07/23/24 0423  NA 142  --  139 139 139  K 4.6  --  4.2 4.4 4.9  CL 111  --  113* 110 111  CO2 20*  --  21* 22 22  GLUCOSE 111*  --  84 79 87  BUN 30*  --  32* 27* 33*  CREATININE 1.31*  --  1.39* 1.16* 1.42*  CALCIUM 9.0  --  8.0* 8.1* 8.4*  MG  --  1.9  --   --   --    Liver Function Tests: Recent Labs  Lab 07/19/24 1715  AST 24  ALT 13  ALKPHOS  43  BILITOT 0.7  PROT 7.8  ALBUMIN  3.9   CBG: No results for input(s): GLUCAP in the last 168 hours.  Discharge time spent: greater than 30 minutes.  Signed: Delon Herald, MD [facility] 07/23/2024

## 2024-07-23 NOTE — Plan of Care (Signed)
   Problem: Clinical Measurements: Goal: Diagnostic test results will improve Outcome: Progressing   Problem: Clinical Measurements: Goal: Respiratory complications will improve Outcome: Progressing   Problem: Clinical Measurements: Goal: Cardiovascular complication will be avoided Outcome: Progressing   Problem: Nutrition: Goal: Adequate nutrition will be maintained Outcome: Progressing   Problem: Pain Managment: Goal: General experience of comfort will improve and/or be controlled Outcome: Progressing

## 2024-07-23 NOTE — Congregational Nurse Program (Signed)
 Pt alert and confused. Discharge instructions reviewed with pt and family. Pt and family verbalized understanding. Prescription delivered to bedside. IV removed. IV site WNL. Pt's family stated that they did not want to wait on ambulance and would transport pt home in car. Pt left unit in wheelchair with RN. Pt left hospital with family. Pt DNR paperwork was left at the hospital. Nurse notified family. DNR paperwork at nursing station in discharge envelope with pt label.

## 2024-07-23 NOTE — TOC CM/SW Note (Signed)
 Transition of Care [facility]) - Inpatient Brief Assessment   Patient Details  Name: Kathy Villanueva MRN: [id number] Date of Birth: 1924-04-27  Transition of Care [facility] CM/SW Contact:    Lauraine [name] Carpen, LCSW Phone Number: 07/23/2024, 10:20 AM   Clinical Narrative: CSW reviewed chart. No TOC needs identified so far. Per notes, family wants to resume outpatient palliative services at discharge and patient has 24/7 support at home. CSW will continue to follow progress. Please consult TOC if any needs arise.  Transition of Care Asessment: Insurance and Status: Insurance coverage has been reviewed Patient has primary care physician: Yes Home environment has been reviewed: Single family home Prior level of function:: Max squat pivot with family Prior/Current Home Services: Current home services Social Drivers of Health Review: SDOH reviewed no interventions necessary Readmission risk has been reviewed: Yes Transition of care needs: no transition of care needs at this time

## 2024-07-26 ENCOUNTER — Other Ambulatory Visit: Payer: Self-pay

## 2024-07-27 ENCOUNTER — Other Ambulatory Visit: Payer: Self-pay

## 2024-08-07 DIAGNOSIS — F0390 Unspecified dementia without behavioral disturbance: Secondary | ICD-10-CM | POA: Diagnosis not present

## 2024-08-07 DIAGNOSIS — N183 Chronic kidney disease, stage 3 unspecified: Secondary | ICD-10-CM | POA: Diagnosis not present

## 2024-08-07 DIAGNOSIS — Z993 Dependence on wheelchair: Secondary | ICD-10-CM | POA: Diagnosis not present

## 2024-08-07 DIAGNOSIS — Z9181 History of falling: Secondary | ICD-10-CM | POA: Diagnosis not present

## 2024-08-07 DIAGNOSIS — R159 Full incontinence of feces: Secondary | ICD-10-CM | POA: Diagnosis not present

## 2024-08-07 DIAGNOSIS — R3981 Functional urinary incontinence: Secondary | ICD-10-CM | POA: Diagnosis not present

## 2024-09-06 DIAGNOSIS — Z9181 History of falling: Secondary | ICD-10-CM | POA: Diagnosis not present

## 2024-09-06 DIAGNOSIS — Z993 Dependence on wheelchair: Secondary | ICD-10-CM | POA: Diagnosis not present

## 2024-09-06 DIAGNOSIS — F0390 Unspecified dementia without behavioral disturbance: Secondary | ICD-10-CM | POA: Diagnosis not present

## 2024-09-06 DIAGNOSIS — R159 Full incontinence of feces: Secondary | ICD-10-CM | POA: Diagnosis not present

## 2024-09-06 DIAGNOSIS — N183 Chronic kidney disease, stage 3 unspecified: Secondary | ICD-10-CM | POA: Diagnosis not present

## 2024-09-06 DIAGNOSIS — R3981 Functional urinary incontinence: Secondary | ICD-10-CM | POA: Diagnosis not present

## 2024-10-06 DIAGNOSIS — F0390 Unspecified dementia without behavioral disturbance: Secondary | ICD-10-CM | POA: Diagnosis not present

## 2024-10-06 DIAGNOSIS — R159 Full incontinence of feces: Secondary | ICD-10-CM | POA: Diagnosis not present

## 2024-10-06 DIAGNOSIS — R3981 Functional urinary incontinence: Secondary | ICD-10-CM | POA: Diagnosis not present

## 2024-10-06 DIAGNOSIS — Z9181 History of falling: Secondary | ICD-10-CM | POA: Diagnosis not present

## 2024-10-06 DIAGNOSIS — Z993 Dependence on wheelchair: Secondary | ICD-10-CM | POA: Diagnosis not present

## 2024-10-06 DIAGNOSIS — N183 Chronic kidney disease, stage 3 unspecified: Secondary | ICD-10-CM | POA: Diagnosis not present

## 2024-11-11 DIAGNOSIS — Z9181 History of falling: Secondary | ICD-10-CM | POA: Diagnosis not present

## 2024-11-11 DIAGNOSIS — R159 Full incontinence of feces: Secondary | ICD-10-CM | POA: Diagnosis not present

## 2024-11-11 DIAGNOSIS — F0390 Unspecified dementia without behavioral disturbance: Secondary | ICD-10-CM | POA: Diagnosis not present

## 2024-11-11 DIAGNOSIS — Z993 Dependence on wheelchair: Secondary | ICD-10-CM | POA: Diagnosis not present

## 2024-11-11 DIAGNOSIS — N183 Chronic kidney disease, stage 3 unspecified: Secondary | ICD-10-CM | POA: Diagnosis not present

## 2024-11-11 DIAGNOSIS — R3981 Functional urinary incontinence: Secondary | ICD-10-CM | POA: Diagnosis not present
